# Patient Record
Sex: Female | Born: 1935 | Race: White | Hispanic: No | Smoking: Never smoker
Health system: Southern US, Community
[De-identification: ages and names within clinical notes are randomized; demographics above are authoritative.]

## PROBLEM LIST (undated history)

## (undated) DIAGNOSIS — F22 Delusional disorders: Secondary | ICD-10-CM

## (undated) DIAGNOSIS — G709 Myoneural disorder, unspecified: Secondary | ICD-10-CM

## (undated) DIAGNOSIS — K219 Gastro-esophageal reflux disease without esophagitis: Secondary | ICD-10-CM

## (undated) DIAGNOSIS — E669 Obesity, unspecified: Secondary | ICD-10-CM

## (undated) DIAGNOSIS — N200 Calculus of kidney: Secondary | ICD-10-CM

## (undated) DIAGNOSIS — M1990 Unspecified osteoarthritis, unspecified site: Secondary | ICD-10-CM

## (undated) DIAGNOSIS — R918 Other nonspecific abnormal finding of lung field: Secondary | ICD-10-CM

## (undated) DIAGNOSIS — I2699 Other pulmonary embolism without acute cor pulmonale: Secondary | ICD-10-CM

## (undated) DIAGNOSIS — R011 Cardiac murmur, unspecified: Secondary | ICD-10-CM

## (undated) DIAGNOSIS — G4733 Obstructive sleep apnea (adult) (pediatric): Secondary | ICD-10-CM

## (undated) DIAGNOSIS — Z8719 Personal history of other diseases of the digestive system: Secondary | ICD-10-CM

## (undated) DIAGNOSIS — I10 Essential (primary) hypertension: Secondary | ICD-10-CM

## (undated) DIAGNOSIS — R300 Dysuria: Secondary | ICD-10-CM

## (undated) DIAGNOSIS — R413 Other amnesia: Secondary | ICD-10-CM

## (undated) DIAGNOSIS — R079 Chest pain, unspecified: Secondary | ICD-10-CM

## (undated) DIAGNOSIS — H8390 Unspecified disease of inner ear, unspecified ear: Secondary | ICD-10-CM

## (undated) DIAGNOSIS — I4891 Unspecified atrial fibrillation: Secondary | ICD-10-CM

## (undated) DIAGNOSIS — F23 Brief psychotic disorder: Secondary | ICD-10-CM

## (undated) DIAGNOSIS — Z960 Presence of urogenital implants: Secondary | ICD-10-CM

## (undated) DIAGNOSIS — J3489 Other specified disorders of nose and nasal sinuses: Secondary | ICD-10-CM

## (undated) DIAGNOSIS — N3941 Urge incontinence: Secondary | ICD-10-CM

## (undated) DIAGNOSIS — Z8619 Personal history of other infectious and parasitic diseases: Secondary | ICD-10-CM

## (undated) HISTORY — PX: APPENDECTOMY: SHX54

## (undated) HISTORY — PX: JOINT REPLACEMENT: SHX530

## (undated) HISTORY — PX: CARDIOVASCULAR STRESS TEST: SHX262

## (undated) HISTORY — PX: TOTAL KNEE ARTHROPLASTY: SHX125

## (undated) HISTORY — PX: BLEPHAROPLASTY: SUR158

## (undated) HISTORY — PX: CATARACT EXTRACTION W/ INTRAOCULAR LENS  IMPLANT, BILATERAL: SHX1307

## (undated) HISTORY — PX: TONSILLECTOMY: SUR1361

---

## 1959-02-10 HISTORY — PX: OTHER SURGICAL HISTORY: SHX169

## 1968-06-11 HISTORY — PX: VAGINAL HYSTERECTOMY: SUR661

## 1999-06-06 ENCOUNTER — Encounter: Payer: Self-pay | Admitting: Family Medicine

## 1999-06-06 ENCOUNTER — Encounter: Admission: RE | Admit: 1999-06-06 | Discharge: 1999-06-06 | Payer: Self-pay | Admitting: Family Medicine

## 1999-07-20 ENCOUNTER — Other Ambulatory Visit: Admission: RE | Admit: 1999-07-20 | Discharge: 1999-07-20 | Payer: Self-pay | Admitting: Gastroenterology

## 2000-09-18 ENCOUNTER — Encounter: Admission: RE | Admit: 2000-09-18 | Discharge: 2000-09-18 | Payer: Self-pay | Admitting: Family Medicine

## 2000-09-18 ENCOUNTER — Encounter: Payer: Self-pay | Admitting: Family Medicine

## 2001-10-14 ENCOUNTER — Encounter: Payer: Self-pay | Admitting: Family Medicine

## 2001-10-14 ENCOUNTER — Encounter: Admission: RE | Admit: 2001-10-14 | Discharge: 2001-10-14 | Payer: Self-pay | Admitting: *Deleted

## 2002-10-16 ENCOUNTER — Encounter: Payer: Self-pay | Admitting: Family Medicine

## 2002-10-16 ENCOUNTER — Encounter: Admission: RE | Admit: 2002-10-16 | Discharge: 2002-10-16 | Payer: Self-pay | Admitting: Family Medicine

## 2004-08-28 ENCOUNTER — Encounter: Admission: RE | Admit: 2004-08-28 | Discharge: 2004-08-28 | Payer: Self-pay | Admitting: Family Medicine

## 2005-01-01 ENCOUNTER — Ambulatory Visit: Payer: Self-pay | Admitting: Internal Medicine

## 2005-01-12 ENCOUNTER — Ambulatory Visit: Payer: Self-pay | Admitting: Internal Medicine

## 2005-01-12 ENCOUNTER — Encounter (INDEPENDENT_AMBULATORY_CARE_PROVIDER_SITE_OTHER): Payer: Self-pay | Admitting: *Deleted

## 2005-01-12 ENCOUNTER — Encounter (INDEPENDENT_AMBULATORY_CARE_PROVIDER_SITE_OTHER): Payer: Self-pay | Admitting: Specialist

## 2006-03-19 ENCOUNTER — Ambulatory Visit (HOSPITAL_COMMUNITY): Admission: RE | Admit: 2006-03-19 | Discharge: 2006-03-19 | Payer: Self-pay | Admitting: Family Medicine

## 2006-03-19 ENCOUNTER — Encounter (INDEPENDENT_AMBULATORY_CARE_PROVIDER_SITE_OTHER): Payer: Self-pay | Admitting: *Deleted

## 2008-07-15 ENCOUNTER — Encounter (INDEPENDENT_AMBULATORY_CARE_PROVIDER_SITE_OTHER): Payer: Self-pay | Admitting: *Deleted

## 2008-07-15 ENCOUNTER — Inpatient Hospital Stay (HOSPITAL_COMMUNITY): Admission: EM | Admit: 2008-07-15 | Discharge: 2008-07-17 | Payer: Self-pay | Admitting: Emergency Medicine

## 2008-07-17 ENCOUNTER — Encounter (INDEPENDENT_AMBULATORY_CARE_PROVIDER_SITE_OTHER): Payer: Self-pay | Admitting: *Deleted

## 2008-07-29 ENCOUNTER — Telehealth: Payer: Self-pay | Admitting: Internal Medicine

## 2008-08-18 HISTORY — PX: TRANSTHORACIC ECHOCARDIOGRAM: SHX275

## 2008-10-01 ENCOUNTER — Encounter (INDEPENDENT_AMBULATORY_CARE_PROVIDER_SITE_OTHER): Payer: Self-pay | Admitting: *Deleted

## 2008-10-01 ENCOUNTER — Encounter: Admission: RE | Admit: 2008-10-01 | Discharge: 2008-10-01 | Payer: Self-pay | Admitting: Family Medicine

## 2009-03-09 ENCOUNTER — Ambulatory Visit (HOSPITAL_COMMUNITY): Admission: RE | Admit: 2009-03-09 | Discharge: 2009-03-09 | Payer: Self-pay | Admitting: Family Medicine

## 2009-03-09 ENCOUNTER — Encounter (INDEPENDENT_AMBULATORY_CARE_PROVIDER_SITE_OTHER): Payer: Self-pay | Admitting: *Deleted

## 2009-04-25 ENCOUNTER — Inpatient Hospital Stay (HOSPITAL_COMMUNITY): Admission: RE | Admit: 2009-04-25 | Discharge: 2009-04-28 | Payer: Self-pay | Admitting: Orthopedic Surgery

## 2009-04-25 HISTORY — PX: TOTAL KNEE ARTHROPLASTY: SHX125

## 2009-04-28 ENCOUNTER — Encounter (INDEPENDENT_AMBULATORY_CARE_PROVIDER_SITE_OTHER): Payer: Self-pay | Admitting: *Deleted

## 2009-10-17 ENCOUNTER — Inpatient Hospital Stay (HOSPITAL_COMMUNITY): Admission: RE | Admit: 2009-10-17 | Discharge: 2009-10-20 | Payer: Self-pay | Admitting: Orthopedic Surgery

## 2009-10-20 ENCOUNTER — Encounter (INDEPENDENT_AMBULATORY_CARE_PROVIDER_SITE_OTHER): Payer: Self-pay | Admitting: *Deleted

## 2009-10-25 ENCOUNTER — Observation Stay (HOSPITAL_COMMUNITY): Admission: EM | Admit: 2009-10-25 | Discharge: 2009-10-26 | Payer: Self-pay | Admitting: Emergency Medicine

## 2009-10-26 ENCOUNTER — Ambulatory Visit: Payer: Self-pay | Admitting: Vascular Surgery

## 2009-10-26 ENCOUNTER — Encounter (INDEPENDENT_AMBULATORY_CARE_PROVIDER_SITE_OTHER): Payer: Self-pay | Admitting: *Deleted

## 2010-03-10 ENCOUNTER — Encounter (INDEPENDENT_AMBULATORY_CARE_PROVIDER_SITE_OTHER): Payer: Self-pay | Admitting: *Deleted

## 2010-03-10 ENCOUNTER — Ambulatory Visit (HOSPITAL_COMMUNITY): Admission: RE | Admit: 2010-03-10 | Discharge: 2010-03-10 | Payer: Self-pay | Admitting: Family Medicine

## 2010-06-14 ENCOUNTER — Encounter: Payer: Self-pay | Admitting: Internal Medicine

## 2010-06-14 ENCOUNTER — Ambulatory Visit
Admission: RE | Admit: 2010-06-14 | Discharge: 2010-06-14 | Payer: Self-pay | Source: Home / Self Care | Attending: Internal Medicine | Admitting: Internal Medicine

## 2010-06-14 DIAGNOSIS — K219 Gastro-esophageal reflux disease without esophagitis: Secondary | ICD-10-CM | POA: Insufficient documentation

## 2010-06-14 DIAGNOSIS — K625 Hemorrhage of anus and rectum: Secondary | ICD-10-CM | POA: Insufficient documentation

## 2010-06-14 DIAGNOSIS — K5730 Diverticulosis of large intestine without perforation or abscess without bleeding: Secondary | ICD-10-CM | POA: Insufficient documentation

## 2010-06-14 DIAGNOSIS — G4733 Obstructive sleep apnea (adult) (pediatric): Secondary | ICD-10-CM | POA: Insufficient documentation

## 2010-06-14 DIAGNOSIS — Z860100 Personal history of colon polyps, unspecified: Secondary | ICD-10-CM | POA: Insufficient documentation

## 2010-07-02 ENCOUNTER — Encounter: Payer: Self-pay | Admitting: Family Medicine

## 2010-07-03 ENCOUNTER — Encounter: Payer: Self-pay | Admitting: Family Medicine

## 2010-07-11 ENCOUNTER — Ambulatory Visit
Admission: RE | Admit: 2010-07-11 | Discharge: 2010-07-11 | Payer: Self-pay | Source: Home / Self Care | Attending: Internal Medicine | Admitting: Internal Medicine

## 2010-07-11 ENCOUNTER — Other Ambulatory Visit: Payer: Self-pay | Admitting: Internal Medicine

## 2010-07-11 DIAGNOSIS — D126 Benign neoplasm of colon, unspecified: Secondary | ICD-10-CM

## 2010-07-13 NOTE — Discharge Summary (Signed)
Summary: Discharge Summary    NAME:  Monica Strong, Monica Strong NO.:  [id number]      MEDICAL RECORD NO.:  [id number]          PATIENT TYPE:  INP      LOCATION:  1605                         FACILITY:  [facility]      PHYSICIAN:  Ollen Gross, M.D.    DATE OF BIRTH:  1931-06-24      DATE OF ADMISSION:  10/25/2009   DATE OF DISCHARGE:  10/26/2009                                  DISCHARGE SUMMARY      ADMITTING DIAGNOSES:   1. Recent status post left total knee with postoperative swelling and       leg pain.   2. History of gastritis.   3. Cataracts.   4. Sleep apnea.   5. Hiatal hernia.   6. Hemorrhoids.   7. History of diverticulosis.   8. Osteoarthritis.   9. Short-term memory loss.   10.History of shingles.   11.Occasional incontinence.   12.Hepatitis during teenage years, probably viral.      DISCHARGE DIAGNOSES:   1. Recent status post left total knee with postoperative leg pain and       swelling.  Deep vein thrombosis ruled out, negative scan.      1. History of gastritis.   2. Cataracts.3. Sleep apnea.   4. Hiatal hernia.   5. Hemorrhoids.   6. History of diverticulosis.   7. Osteoarthritis.   8. Short-term memory loss.   9. History of shingles.   10.Occasional incontinence.   11.Hepatitis during teenage years, probably viral.      PROCEDURES:  None.      HISTORY:  Monica Strong is a 77 year old female, recent total knee.  She   went over to [facility] and had some postoperative swelling and leg   pain.  She was sent to the emergency department on the evening of May 17   for evaluation and possible ruled out deep vein thrombosis.  The   hospital was unable to do duplex Doppler scans during the night so she   was admitted overnight.  She had the scan in the next morning.      LABORATORY DATA:  CBC showed admission hemoglobin of 9.9, hematocrit   29.0, white cell count 6.1, platelets 310.  PT/INR 18.7 and 1.58.   Sodium 140, potassium 4.1, chloride  105, BUN 22, creatinine 0.9.   Doppler study was reported as negative.  I do not have the typed report   at the time of this dictation, but it was called up and reported as   negative.      HOSPITAL COURSE:  The patient was admitted through the emergency   department  at [facility] and placed at bedrest overnight.   She was seen on rounds the next morning by Dr. Lequita Halt and found to have   some swelling in the leg, but it did not appear to be too much out of   the ordinary or unexpected swelling.  She did have a Doppler scan which   was reported as negative.  Due to the  negative workup, it was felt that   she would be able to go back to [facility] today.      DISCHARGE/PLAN:   1. The patient was transferred back to [facility] today, Oct 26, 2009.   2. Discharge diagnoses same as above.   3. Discharge medications.  She is to continue all her current       medications which include:   4. Ocuvite 1 tablet daily.   5. Colace 100 mg twice a day.   6. Prilosec 20 mg daily.   7. Nu-Iron daily.   8. Robaxin 500 mg q.6-8 hours p.r.n. spasm.   9. Percocet 5 mg 1 or 2 every 4-6 hours as needed for pain.   10.She will also have Coumadin protocol.  Please titrate the Coumadin       level for a target INR of between 2.0 and 3.0.  She is to be on       Coumadin for total of 3 weeks from the date of surgery of Oct 17, 2009.      DIET:  As tolerated.      ACTIVITY:  She is to resume her total knee protocol.  Physical therapy   and occupational therapy for continued gait training and ambulation,   activities of daily living, range of motion and strengthening exercises.      FOLLOWUP:  Please make sure she has her followup appointment on Tuesday,   May 24.  Please contact the office of [phone].      DISPOSITION:  [facility]      CONDITION UPON DISCHARGE:  Stable.               Alexzandrew L. Julien Girt, P.A.C.         ______________________________   Ollen Gross, M.D.         ALP/MEDQ  D:  10/26/2009  T:  10/26/2009  Job:  [id number]      cc:   Armanda Magic, M.D.   Fax: [phone]         [facility]      Electronically Signed by Patrica Duel P.A.C. on 10/31/2009 10:53:12 AM   Electronically Signed by Ollen Gross M.D. on 10/31/2009 04:39:38 PM

## 2010-07-13 NOTE — Procedures (Signed)
Summary: Colonoscopy/[facility]  Colonoscopy/[facility]   Imported By: Sherian Rein 06/16/2010 07:09:37  _____________________________________________________________________  External Attachment:    Type:   Image     Comment:   External Document

## 2010-07-13 NOTE — Assessment & Plan Note (Signed)
Summary: Rectal bleeding and a history of colon polyps   History of Present Illness Visit Type: Initial Visit Primary GI MD: Yancey Flemings MD Primary Provider: Elias Else, MD Requesting Provider: Dr. Nicholos Johns Chief Complaint: bleeding and a history of polyps History of Present Illness:   77 year old female with multiple medical problems including morbid obesity, sleep apnea, severe osteoarthritis, and adenomatous colon polyps. She presents today regarding recent problems with transient abdominal discomfort, rectal discomfort, and rectal bleeding. She reports a several month history of intermittent problems with lower abdominal cramping improved with defecation. This has resolved. Before Christmas she noticed significant amounts of blood mixed with her stool. This scared her. There was some associated rectal discomfort. She saw a physician assistant at her primary provider's office. Rectal exam at that time unremarkable. She does have a history of multiple adenomatous colon polyps on her index colonoscopy in August of 2006. Also at that time diverticulosis and internal hemorrhoids. She is overdue for followup..she does have a history of reflux disease for which she takes Prilosec. Significant symptoms off medication. Good control on medication. Her only other complaint is bloating. She has had no weight loss.   GI Review of Systems    Reports abdominal pain, acid reflux, bloating, and  heartburn.     Location of  Abdominal pain: left sided/right sided.    Denies belching, dysphagia with liquids, dysphagia with solids, loss of appetite, nausea, vomiting, vomiting blood, weight loss, and  weight gain.      Reports diverticulosis, hemorrhoids, rectal bleeding, and  rectal pain.     Denies anal fissure, black tarry stools, change in bowel habit, constipation, diarrhea, fecal incontinence, heme positive stool, irritable bowel syndrome, jaundice, light color stool, and  liver problems. Preventive  Screening-Counseling & Management  Alcohol-Tobacco     Smoking Status: never  Caffeine-Diet-Exercise     Does Patient Exercise: no      Drug Use:  no.      Current Medications (verified): 1)  Prilosec Otc 20 Mg Tbec (Omeprazole Magnesium) .... Take 1 Tablet By Mouth Once A Day As Needed 2)  Tylenol Extra Strength 500 Mg Tabs (Acetaminophen) .... Take 2 Tablets By Mouth As Needed For Pain 3)  Fish Oil (Unknown Dosage) .... Take 1 Capsule By Mouth Once Daily 4)  Multivitamins  Tabs (Multiple Vitamin) .... Take 1 Tablet By Mouth Once A Day 5)  Soothe Hydration 1.25 % Soln (Artificial Tear Solution) .... Apply 1 Drop Each Eye Once Daily  Allergies (verified): 1)  ! Demerol 2)  ! Pcn 3)  ! Sulfa  Past History:  Past Medical History: Hemorrhoids Hiatal Hernia Hx Gastritis Cataracts Sleep Apnea Diverticulosis Osteoarthritis.  Short-term memory loss.  History of shingles.  Occasional incontinence.  Hepatitis during teenage years, probably viral.  Hx Colon Polyps  Past Surgical History: Bilateral Knee Surgery Left Breast Lumpectomy Cataract Extraction-bilateral Lens implants-bilateral Eyelid sugery (x1 right, x2 left)  Family History: Family History of Breast Cancer:Daughter Family History of Heart Disease: Father  Social History: Patient has never smoked.  Alcohol Use - yes-very rare Illicit Drug Use - no Daily Caffeine Use-1 cup daily Patient does not get regular exercise.  Smoking Status:  never Drug Use:  no Does Patient Exercise:  no  Review of Systems       The patient complains of allergy/sinus, arthritis/joint pain, back pain, change in vision, confusion, depression-new, fatigue, fever, heart murmur, sleeping problems, swelling of feet/legs, and urination changes/pain.  The patient denies anemia, anxiety-new,  blood in urine, breast changes/lumps, cough, coughing up blood, fainting, headaches-new, hearing problems, heart rhythm changes, itching, menstrual  pain, muscle pains/cramps, night sweats, nosebleeds, pregnancy symptoms, shortness of breath, skin rash, sore throat, swollen lymph glands, thirst - excessive , urination - excessive , urine leakage, vision changes, and voice change.    Vital Signs:  Patient profile:   77 year old female Height:      62 inches Weight:      226.50 pounds BMI:     41.58 BSA:     2.02 Pulse rate:   64 / minute Pulse rhythm:   regular BP sitting:   122 / 70  (left arm)  Vitals Entered By: Lamona Curl CMA Duncan Dull) (June 14, 2010 10:29 AM)  Physical Exam  General:  Well developed,obese, well nourished, no acute distress. Head:  Normocephalic and atraumatic. Eyes:  PERRLA, no icterus. Mouth:  No deformity or lesions, Neck:  Supple; no masses or thyromegaly. Lungs:  Clear throughout to auscultation. Heart:  Regular rate and rhythm; no murmurs, rubs,  or bruits. Abdomen:  Soft, obese,nontender and nondistended. No masses, hepatosplenomegaly or hernias noted. Normal bowel sounds. Rectal:  deferred until colonoscopy Msk:  changes of osteoarthritis in the knees bilaterally Pulses:  Normal pulses noted. Extremities:  No clubbing, cyanosis, edema or deformities noted. Neurologic:  Alert and  oriented x4. Skin:  Intact without significant lesions or rashes. Psych:  Alert and cooperative. Normal mood and affect.   Impression & Recommendations:  Problem # 1:  RECTAL BLEEDING (ICD-569.3) Recent problems with rectal bleeding and minor rectal discomfort as described. May have had a transient fissure. Could be due to known hemorrhoids. Need to rule out neoplasia.  Plan: #1. Colonoscopy. The nature of the procedure as well as the risks, benefits, and alternatives have been reviewed. She understood and agreed to proceed. The patient is high risk due to her obesity and sleep apnea. We will need to perform the procedure with propofol and CRNA supervision #2. Movi prep prescribed. The patient instructed on  its use.  Problem # 2:  COLONIC POLYPS, HX OF (ICD-V12.72) history of multiple adenomatous colon polyps August 2006. Overdue for followup. Colonoscopy plans as outlined above  Problem # 3:  MORBID OBESITY (ICD-278.01) Assessment: Unchanged  Orders: Colonoscopy (Colon)  Problem # 4:  SLEEP APNEA, OBSTRUCTIVE (ICD-327.23) Assessment: Unchanged  Orders: Colonoscopy (Colon)  Problem # 5:  GERD (ICD-530.81) continue PPIs for symptom control and reflux precautions with attention to weight loss  Patient Instructions: 1)  Colonoscopy [facility] with Propoful 07/11/10 9:00 am arrive at 8:00 am 2)  Movi prep instructions given 3)  Movi prep Rx. sent to pharmacy. 4)  Colonoscopy and Flexible Sigmoidoscopy brochure given.  5)  Copy sent to : Elias Else, MD 6)  The medication list was reviewed and reconciled.  All changed / newly prescribed medications were explained.  A complete medication list was provided to the patient / caregiver. Prescriptions: MOVIPREP 100 GM  SOLR (PEG-KCL-NACL-NASULF-NA ASC-C) As per prep instructions.  #1 x 0   Entered by:   Milford Cage NCMA   Authorized by:   Hilarie Fredrickson MD   Signed by:   Milford Cage NCMA on 06/14/2010   Method used:   Electronically to        [facility]  [phone]* (retail)       [address] [place], [place]  [address]       Ph:  [phone] or [phone]       Fax: [phone]   RxID:   [id number]

## 2010-07-13 NOTE — Discharge Summary (Signed)
Summary: Discharge Summary    NAME:  Monica Strong, Monica Strong                ACCOUNT NO.:  [id number]      MEDICAL RECORD NO.:  [id number]          PATIENT TYPE:  INP      LOCATION:  1617                         FACILITY:  [facility]      PHYSICIAN:  Ollen Gross, M.D.    DATE OF BIRTH:  January 14, 1932      DATE OF ADMISSION:  10/17/2009   DATE OF DISCHARGE:  10/20/2009                                  DISCHARGE SUMMARY      ADMITTING DIAGNOSES:   1. Osteoarthritis of the left knee.   2. History of gastritis.   3. Cataracts.   4. Sleep apnea.   5. Hiatal hernia.   6. Hemorrhoids.   7. Past history of diverticulosis.   8. Osteoarthritis.   9. Short-term memory loss.   10.History of shingles.   11.Occasional incontinence.   12.Hepatitis during teenage years (probably viral).      DISCHARGE DIAGNOSES:   1. Osteoarthritis of the left knee status post left total knee       replacement arthroplasty.   2. Mild postoperative blood loss anemia, did not require transfusion.   3. Mild thrombocytopenia postoperative.      PROCEDURE:  Oct 17, 2009 left total knee.      SURGEON:  Dr. Lequita Halt.      ASSISTANT:  Avel Peace, PA-C.      ANESTHESIA:  Spinal.      TOURNIQUET TIME:  35 minutes.      CONSULTS:  None.      BRIEF HISTORY:  Monica Strong is a 77 year old female with end-stage   arthritis of the left knee, progressive, worsening pain and dysfunction.   She has had a successful right total knee, now presents for a left total   knee.      LABORATORY DATA:  Preoperative CBC showed a hemoglobin of 13, hematocrit   39.2, white cell count 4.8, platelets 183.  PT/INR 12.8 and 0.9 with a   PTT of 29.  Chem panel on admission within normal limits.  Preoperative   UA did show some type brace leukocytes, a few epithelials, zero to 2   white cells, few bacteria.  Blood group type O negative.  Serial CBCs   were followed.  Hemoglobin dropped down to 9.5 then 9.3, last known   hemoglobin and hematocrit of  9.1 and 27.7.  Platelets started at 183,   down to 117, last noted at 115.  Serial ProTimes followed per Coumadin   protocol, last known PT/INR 18.4 and 1.54.  Serial BMETs were followed,   electrolytes remained within normal limits.      EKG Oct 12, 2009:  Normal sinus rhythm with sinus arrhythmia.  No   significant change was found, confirmed by Dr. Charlton Haws.      HOSPITAL COURSE:  The patient was admitted [facility], taken   to the OR, underwent above-stated procedure without complication.  The   patient tolerated the procedure well, later transferred to the recovery   room orthopedic floor.  Started on  p.o. and IV analgesic pain control   following surgery.  She knew she wanted to look into [facility] or a   skilled facility so we got social work involved, we got FL-2 signed and   sent out.  She had a little bit of low output requiring a little bit of   extra fluids on day one, hemoglobin was about 9.5.  Started back on her   home medications.  Started getting up out of bed on day one, just   transfers.  She did ambulate a little bit of short distance in the room,   about 20 feet.  By day two she was doing a little bit better, had some   belly discomfort and no bowel movement so we tried a little suppository   to get her bowels moving.  She was passing a little gas which did   improve.  Hemoglobin was 9.3.  Dressing changed, incision looked good,   no signs of infection.  Continue to improve and by day three she was   walking over 90 feet.  It was felt a bed was available over at [facility].  Seen in rounds, incision looked good and we arranged transport   over at that time.      DISCHARGE PLAN:  The patient transferred over to [facility] on Oct 20, 2009.      DISCHARGE DIAGNOSES:  Please see above.      DISCHARGE MEDICATIONS:  Current medications at time of transfer include:   1. Ocuvite 1 tablet daily.   2. Colace 100 mg p.o. b.i.d.   3. Coumadin  protocol, please titrate the Coumadin level for a target       INR between 2 and 3.  She needs to be on Coumadin for three  weeks       from the date of surgery of Oct 17, 2009.   4. Prilosec 20 mg p.o. daily.   5. Nu-Iron 150 mg p.o. daily.   6. Robaxin 500 mg p.o. q.6 - 8 hours p.r.n. spasm.   7. Percocet 5 mg one or two every 4 - 6 hours as needed for pain.   8. Tylenol 325 one or two every 4 - 6 hours as needed for mild pain,       temperature or headache.      DIET:  As tolerated.      ACTIVITIES:  She is total knee protocol, weightbearing as tolerated left   lower extremity.  PT and OT for gait training, ambulation, ADLs, range   of motion and strengthening exercises.  She may start showering however   do not submerge the incision under water.  Daily dressing change.      FOLLOWUP:  She needs to follow up with Dr. Lequita Halt in the office on   Tuesday, May 24; please contact the office at [phone] to help arrange   appointment and followup and transfer the patient for further care.      DISPOSITION:  [facility].  Please note she does use a CPAP at night   and should continue the CPAP in the evenings.      CONDITION UPON DISCHARGE:  Improving.               Alexzandrew L. Julien Girt, P.A.C.         ______________________________   Ollen Gross, M.D.         ALP/MEDQ  D:  10/20/2009  T:  10/20/2009  Job:  [id number]      cc:   Elana Alm. Nicholos Johns, M.D.   Fax: [phone]      Armanda Magic, M.D.   Fax: [phone]      Electronically Signed by Patrica Duel P.A.C. on 10/21/2009 10:11:01 AM   Electronically Signed by Ollen Gross M.D. on 10/31/2009 04:39:23 PM

## 2010-07-13 NOTE — Discharge Summary (Signed)
Summary: Discharge Summary  NAME:  Monica Strong, CAMARENA                ACCOUNT NO.:  [id number]      MEDICAL RECORD NO.:  [id number]          PATIENT TYPE:  INP      LOCATION:  1607                         FACILITY:  [facility]      PHYSICIAN:  Ollen Gross, M.D.    DATE OF BIRTH:  1931/08/28      DATE OF ADMISSION:  04/25/2009   DATE OF DISCHARGE:  04/28/2009                                  DISCHARGE SUMMARY      ADMITTING DIAGNOSES:   1. Bilateral knees osteoarthritis, right greater than left.   2. Cataracts.   3. Sleep apnea.   4. Hiatal hernia.   5. Hemorrhoids.   6. Diverticulosis.      DISCHARGE DIAGNOSES:   1. Osteoarthritis right knee status post right total knee replacement       arthroplasty.   2. Osteoarthritis left knee.   3. Postoperative acute blood loss anemia.   4. Cataracts.   5. Sleep apnea.   6. Hiatal hernia.   7. Hemorrhoids.   8. Diverticulosis.   9. Postoperative confusion/altered mental status improved.      PROCEDURE:  April 25, 2009, right total knee.      SURGEON:  Ollen Gross, M.D.      ASSISTANT:  Alexzandrew L. Perkins, P.A.C.      ANESTHESIA:  Spinal.      CONSULTS:  None.      BRIEF HISTORY:  Ms. Nuccio is a 77 year old female with end-stage   arthritis of both knees progressively getting worse.  Felt to be a good   candidate and subsequently admitted to the hospital for replacement   surgery.      LABORATORY DATA:  Preop CBC showed hemoglobin 13.4, hematocrit 39.6,   white cell count 6.0, platelets 191, PT/INR 12.9 and 0.98, PTT of 31.   Chem panel on admission all within normal limits.  Preop UA negative.   Blood group type O negative.  Serial CBCs were followed daily.   Hemoglobin dropped from 13.4 to 10.4 then 9.3.  Last H and H 8.9 and   26.8.  Serial pro-times followed per Coumadin protocol daily.  Last pro-   time INR at time of discharge is 15.1 and 1.2, slowly increased, so we   will start Lovenox and keep her on Lovenox till  she is therapeutic.   Serial BMETs were followed for 48 hours.  Electrolytes remained within   normal limits.  Myocardial stress test taken on July 17, 2008, normal   study, demonstrating no evidence of inducible ischemia with   administration of adenosine or fixed perfusion defects.  Left   ventricular function is normal.      DIAGNOSTICS:  EKG July 15, 2008:  Sinus rhythm, normal P axis, V rate   50-99, diffuse T-wave inversion with ST depression in V3.  No old   tracing to compare.  Confirmed by Dr. Susy Frizzle.      HOSPITAL COURSE:  The patient was admitted to [facility],   taken to the OR and underwent  the above stated procedure without   complication.  The patient tolerated the procedure well, later   transferred to the recovery room on the orthopedic floor.  Started on   PCA and p.o. analgesic pain control following surgery, 24 hours postop   antibiotics given IV.  She was initially placed on IV narcotics and then   switched over p.o. medications.  Doing fairly well on the morning of day   1.  Encouraged p.o. meds and mobility.  She was very slow to progress   with therapy.  Low urine output was noted, so we gave her extra fluids   and followed the urine output.  She just got out of bed on day 1.  By   day 2, output was still a little low, but slowly increasing.  We did   assist that with a little bit of diuresing with some Lasix IV.  She had   been weaned over p.o. meds.  Unfortunately on the evening of day 1 and   the morning of day 2, she developed some mild postop confusion, altered   mental status, so we discontinued the narcotics and switched her over to   Ultracet.  Hemoglobin was down a little bit lower at 9.3, but she is   asymptomatic with the hemoglobin.  She had a little bit of confusion   with the narcotics which did interfere with her therapy.  By that   afternoon, she was doing much better.  By the morning of day 3, the   confusion and altered  mental status resolved.  She was tolerating the   p.o. meds.  We did look for a skilled facility due to the fact she was   slowly progressing.  A bed did become available at [facility].  She   was seen on rounds on day 3 by Dr. Lequita Halt doing well, no complaints and   she was transferred over to [facility] at that time.      DISPOSITION:  The patient will be transferred over to [facility] on   April 28, 2009.      DISCHARGE TRANSFER MEDICATIONS:   1. Coumadin protocol.  Please titrate the Coumadin level for target       INR between 2.0 and 3.0.  She needs to be on Coumadin for a total       of 3 weeks from the date of surgery, April 25, 2009.   2. Lovenox 40 mg subcutaneous injection daily.  Continue daily until       the patient's INR on her Coumadin is 2.0 or greater.  Once her INR       reaches 2.0 or greater, the Lovenox can be discontinued.  Please       note, she will need daily pro-time INRs until she is therapeutic       and then INRs as per protocol.   3. Colace 100 mg p.o. b.i.d.   4. Ocuvite tab p.o. daily.   5. Prilosec 20 mg p.o. q.a.m.   6. Robaxin 500 mg p.o. q.6-8 h. p.r.n. spasm.   7. Tylenol 325 one-two every 4-6 hours as needed for mild pain of       temporal headache.   8. Laxative of choice.   9. Enema of choice.   10.Ultracet 1-2 tablets every 6 hours as needed for pain.      DIET:  As tolerated.      ACTIVITY:  She is weightbearing as tolerated to  the right lower   extremity total knee protocol.  PT and OT for gait training ambulation,   ADLs, range of motion and strengthening exercises.  She may start   showering, however, do not submerge the incision under water.  Daily   dressing change.      FOLLOW UP:  She needs to follow up with Dr. Lequita Halt in the office about   2 weeks from the date of surgery.  Please contact the office at [phone]   to help arrange appointment time and follow up care for this patient.      CONDITION ON DISCHARGE:   Slowly improving.      Please note, the patient will need daily pro-time INR draws to ensure   that she becomes therapeutic on her Coumadin.  She will receive Lovenox   40 mg daily until she is therapeutic on her INR.  Once her INR is 2.0 or   greater, the Lovenox can be discontinued and then draw pro-time INRs as   needed just to maintain her protocol for a total of 3 weeks from the   date of surgery.               Alexzandrew L. Perkins, P.A.C.               Ollen Gross, M.D.   Electronically Signed         ALP/MEDQ  D:  04/28/2009  T:  04/28/2009  Job:  [id number]      cc:   Ollen Gross, M.D.   Fax: [phone]      Elana Alm. Nicholos Johns, M.D.   Fax: [phone]      Armanda Magic, M.D.   Fax: [phone]

## 2010-07-13 NOTE — Letter (Signed)
Summary: [facility] Instructions  [facility]  [address] [place], [place] [address]   Phone: [phone]  Fax: [phone]       Monica Strong    27-Jun-1931    MRN: [id number]        Procedure Day /Date:07/11/10  TUESDAY     Arrival Time:8:00 AM     Procedure Time:9:00 AM     Location of Procedure:                    X  [facility] (4th Floor)   PREPARATION FOR COLONOSCOPY WITH MOVIPREP WITH PROPOFUL   Starting 5 days prior to your procedure 07/06/10 do not eat nuts, seeds, popcorn, corn, beans, peas,  salads, or any raw vegetables.  Do not take any fiber supplements (e.g. Metamucil, Citrucel, and Benefiber).  THE DAY BEFORE YOUR PROCEDURE         DATE: 07/10/10  DAY: MONDAY  1.  Drink clear liquids the entire day-NO SOLID FOOD  2.  Do not drink anything colored red or purple.  Avoid juices with pulp.  No orange juice.  3.  Drink at least 64 oz. (8 glasses) of fluid/clear liquids during the day to prevent dehydration and help the prep work efficiently.  CLEAR LIQUIDS INCLUDE: Water Jello Ice Popsicles Tea (sugar ok, no milk/cream) Powdered fruit flavored drinks Coffee (sugar ok, no milk/cream) Gatorade Juice: apple, white grape, white cranberry  Lemonade Clear bullion, consomm, broth Carbonated beverages (any kind) Strained chicken noodle soup Hard Candy                             4.  In the morning, mix first dose of MoviPrep solution:    Empty 1 Pouch A and 1 Pouch B into the disposable container    Add lukewarm drinking water to the top line of the container. Mix to dissolve    Refrigerate (mixed solution should be used within 24 hrs)  5.  Begin drinking the prep at 5:00 p.m. The MoviPrep container is divided by 4 marks.   Every 15 minutes drink the solution down to the next mark (approximately 8 oz) until the full liter is complete.   6.  Follow completed prep with 16 oz of clear liquid of your choice (Nothing red or purple).   Continue to drink clear liquids until bedtime.  7.  Before going to bed, mix second dose of MoviPrep solution:    Empty 1 Pouch A and 1 Pouch B into the disposable container    Add lukewarm drinking water to the top line of the container. Mix to dissolve    Refrigerate  THE DAY OF YOUR PROCEDURE      DATE: 07/11/10 DAY: TUESDAY  Beginning at 4:00 a.m. (5 hours before procedure):         1. Every 15 minutes, drink the solution down to the next mark (approx 8 oz) until the full liter is complete.  2. Follow completed prep with 16 oz. of clear liquid of your choice.    3. You may drink clear liquids until 7:00 AM (2 HOURS BEFORE PROCEDURE).   MEDICATION INSTRUCTIONS  Unless otherwise instructed, you should take regular prescription medications with a small sip of water   as early as possible the morning of your procedure.         OTHER INSTRUCTIONS  You will need a responsible adult at least 75  years of age to accompany you and drive you home.   This person must remain in the waiting room during your procedure.  Wear loose fitting clothing that is easily removed.  Leave jewelry and other valuables at home.  However, you may wish to bring a book to read or  an iPod/MP3 player to listen to music as you wait for your procedure to start.  Remove all body piercing jewelry and leave at home.  Total time from sign-in until discharge is approximately 2-3 hours.  You should go home directly after your procedure and rest.  You can resume normal activities the  day after your procedure.  The day of your procedure you should not:   Drive   Make legal decisions   Operate machinery   Drink alcohol   Return to work  You will receive specific instructions about eating, activities and medications before you leave.    The above instructions have been reviewed and explained to me by   _______________________    I fully understand and can verbalize these instructions  _____________________________ Date _________

## 2010-07-18 ENCOUNTER — Encounter: Payer: Self-pay | Admitting: Internal Medicine

## 2010-07-19 NOTE — Procedures (Addendum)
Summary: Colonoscopy  Patient: Monica Strong Note: All result statuses are Final unless otherwise noted.  Tests: (1) Colonoscopy (COL)   COL Colonoscopy           DONE      [facility]     [address]     [place], [place]  [address]           COLONOSCOPY PROCEDURE REPORT           PATIENT:  Monica Strong, Monica Strong  MR#:  [id number]     BIRTHDATE:  August 30, 1931, 79 yrs. old  GENDER:  female     ENDOSCOPIST:  Wilhemina Bonito. Eda Keys, MD     REF. BY:  Surveillance Program Recall,     PROCEDURE DATE:  07/11/2010     PROCEDURE:  Colonoscopy with snare polypectomy x 9     EXTENDED SERVICE MODIFIER FOR     MULTIPLE POLYPS (9) AND TIME (30 MIN)     ASA CLASS:  Class III     INDICATIONS:  history of pre-cancerous (adenomatous) colon polyps,     surveillance and high-risk screening, rectal bleeding     MEDICATIONS:   MAC sedation, administered by CRNA, propofol     (Diprivan) 270 mg IV           DESCRIPTION OF PROCEDURE:   After the risks benefits and     alternatives of the procedure were thoroughly explained, informed     consent was obtained.  Digital rectal exam was performed and     revealed no abnormalities.   The LB CF-[id number] endoscope     was introduced through the anus and advanced to the cecum, which     was identified by both the appendix and ileocecal valve, without     limitations.Time to cecum = 3:24 min. The quality of the prep was     excellent, using MoviPrep.  The instrument was then slowly     withdrawn (time = 24:02 min) as the colon was fully examined.     <<PROCEDUREIMAGES>>           FINDINGS:  There were multiple polyps identified and removed. Four     in the ascending colon, all <57mm; Three intransverse < 5mm and two     sessile transverse colon polyps 6mm and 8mm. Polyps were snared     without cautery. Retrieval was successful.  Moderate     diverticulosis was found in the sigmoid colon.   Retroflexed views     in the rectum revealed internal hemorrhoids.     The scope was then     withdrawn from the patient and the procedure completed.           COMPLICATIONS:  None           ENDOSCOPIC IMPRESSION:     1) Polyps, multiple - removed     2) Moderate diverticulosis in the sigmoid colon     3) Internal hemorrhoids           RECOMMENDATIONS:     1) Repeat Colonoscopy in 3 years if medically fit and willing.     ______________________________     Wilhemina Bonito. Eda Keys, MD           CC:  Elias Else, MD;  The Patient           n.     eSIGNED:   Wilhemina Bonito. Eda Keys at 07/11/2010 10:06 AM  Monica Strong, Monica Strong, [id number]  Note: An exclamation mark (!) indicates a result that was not dispersed into the flowsheet. Document Creation Date: 07/11/2010 10:07 AM _______________________________________________________________________  (1) Order result status: Final Collection or observation date-time: 07/11/2010 09:54 Requested date-time:  Receipt date-time:  Reported date-time:  Referring Physician:   Ordering Physician: Fransico Setters [phone]) Specimen Source:  Source: Launa Grill Order Number: [id number] Lab site:   Appended Document: Colonoscopy recall     Procedures Next Due Date:    Colonoscopy: 06/2013

## 2010-07-27 NOTE — Letter (Signed)
Summary: Patient Notice- Polyp Results  [facility]  [address] [place], [place] [address]   Phone: [phone]  Fax: [phone]        July 18, 2010 MRN: [id number]    Monica Strong [address] [place], [place]  [address]    Dear Ms. [facility],  I am pleased to inform you that the colon polyp(s) removed during your recent colonoscopy was (were) found to be benign (no cancer detected) upon pathologic examination.  I recommend you have a repeat colonoscopy examination in 3 years to look for recurrent polyps, as having colon polyps increases your risk for having recurrent polyps or even colon cancer in the future.  Should you develop new or worsening symptoms of abdominal pain, bowel habit changes or bleeding from the rectum or bowels, please schedule an evaluation with either your primary care physician or with me.  Additional information/recommendations:  __ No further action with gastroenterology is needed at this time. Please      follow-up with your primary care physician for your other healthcare      needs.   Please call us if you are having persistent problems or have questions about your condition that have not been fully answered at this time.  Sincerely,  Hilarie Fredrickson MD  This letter has been electronically signed by your physician.  Appended Document: Patient Notice- Polyp Results LETTER MAILED

## 2010-08-28 LAB — CBC
Hemoglobin: 9.5 g/dL — ABNORMAL LOW (ref 12.0–15.0)
MCV: 93.9 fL (ref 78.0–100.0)
RBC: 3.07 MIL/uL — ABNORMAL LOW (ref 3.87–5.11)

## 2010-08-28 LAB — DIFFERENTIAL
Basophils Relative: 1 % (ref 0–1)
Eosinophils Relative: 1 % (ref 0–5)
Lymphocytes Relative: 25 % (ref 12–46)
Monocytes Absolute: 0.5 10*3/uL (ref 0.1–1.0)
Neutro Abs: 3.9 10*3/uL (ref 1.7–7.7)

## 2010-08-28 LAB — POCT I-STAT, CHEM 8
Calcium, Ion: 1.18 mmol/L (ref 1.12–1.32)
Glucose, Bld: 107 mg/dL — ABNORMAL HIGH (ref 70–99)
Potassium: 4.1 mEq/L (ref 3.5–5.1)
Sodium: 140 mEq/L (ref 135–145)

## 2010-08-28 LAB — PROTIME-INR: INR: 1.58 — ABNORMAL HIGH (ref 0.00–1.49)

## 2010-08-29 ENCOUNTER — Other Ambulatory Visit (HOSPITAL_COMMUNITY): Payer: Self-pay | Admitting: Family Medicine

## 2010-08-29 DIAGNOSIS — R918 Other nonspecific abnormal finding of lung field: Secondary | ICD-10-CM

## 2010-08-29 LAB — CBC
HCT: 27.7 % — ABNORMAL LOW (ref 36.0–46.0)
HCT: 28.3 % — ABNORMAL LOW (ref 36.0–46.0)
HCT: 28.8 % — ABNORMAL LOW (ref 36.0–46.0)
HCT: 39.2 % (ref 36.0–46.0)
Hemoglobin: 9.1 g/dL — ABNORMAL LOW (ref 12.0–15.0)
Hemoglobin: 9.3 g/dL — ABNORMAL LOW (ref 12.0–15.0)
Hemoglobin: 9.5 g/dL — ABNORMAL LOW (ref 12.0–15.0)
MCHC: 32.9 g/dL (ref 30.0–36.0)
MCV: 94 fL (ref 78.0–100.0)
MCV: 94.8 fL (ref 78.0–100.0)
RBC: 2.92 MIL/uL — ABNORMAL LOW (ref 3.87–5.11)
RBC: 2.98 MIL/uL — ABNORMAL LOW (ref 3.87–5.11)
RBC: 3.04 MIL/uL — ABNORMAL LOW (ref 3.87–5.11)
RBC: 4.17 MIL/uL (ref 3.87–5.11)
RDW: 14.3 % (ref 11.5–15.5)
WBC: 6.7 10*3/uL (ref 4.0–10.5)

## 2010-08-29 LAB — TYPE AND SCREEN
ABO/RH(D): O NEG
Antibody Screen: NEGATIVE

## 2010-08-29 LAB — URINALYSIS, ROUTINE W REFLEX MICROSCOPIC
Bilirubin Urine: NEGATIVE
Hgb urine dipstick: NEGATIVE
Protein, ur: NEGATIVE mg/dL
Urobilinogen, UA: 0.2 mg/dL (ref 0.0–1.0)

## 2010-08-29 LAB — PROTIME-INR
INR: 1.12 (ref 0.00–1.49)
INR: 1.54 — ABNORMAL HIGH (ref 0.00–1.49)
Prothrombin Time: 17.1 seconds — ABNORMAL HIGH (ref 11.6–15.2)

## 2010-08-29 LAB — COMPREHENSIVE METABOLIC PANEL
ALT: 12 U/L (ref 0–35)
AST: 15 U/L (ref 0–37)
Alkaline Phosphatase: 71 U/L (ref 39–117)
CO2: 29 mEq/L (ref 19–32)
Calcium: 9.5 mg/dL (ref 8.4–10.5)
GFR calc Af Amer: >60 mL/min (ref >60)
GFR calc non Af Amer: >60 mL/min (ref >60)
Glucose, Bld: 98 mg/dL (ref 70–99)
Potassium: 4.4 mEq/L (ref 3.5–5.1)
Sodium: 143 mEq/L (ref 135–145)
Total Protein: 7 g/dL (ref 6.0–8.3)

## 2010-08-29 LAB — BASIC METABOLIC PANEL
Calcium: 8.6 mg/dL (ref 8.4–10.5)
Chloride: 102 mEq/L (ref 96–112)
GFR calc Af Amer: >60 mL/min (ref >60)
GFR calc Af Amer: >60 mL/min (ref >60)
GFR calc non Af Amer: >60 mL/min (ref >60)
Potassium: 3.9 mEq/L (ref 3.5–5.1)
Potassium: 4.2 mEq/L (ref 3.5–5.1)
Sodium: 137 mEq/L (ref 135–145)
Sodium: 140 mEq/L (ref 135–145)

## 2010-08-29 LAB — URINE MICROSCOPIC-ADD ON

## 2010-08-31 ENCOUNTER — Ambulatory Visit (HOSPITAL_COMMUNITY)
Admission: RE | Admit: 2010-08-31 | Discharge: 2010-08-31 | Disposition: A | Discharge status: Home / Self Care | Payer: Medicare Other | Source: Ambulatory Visit | Attending: Family Medicine | Admitting: Family Medicine

## 2010-08-31 DIAGNOSIS — R918 Other nonspecific abnormal finding of lung field: Secondary | ICD-10-CM

## 2010-08-31 DIAGNOSIS — J984 Other disorders of lung: Secondary | ICD-10-CM | POA: Insufficient documentation

## 2010-08-31 DIAGNOSIS — Q619 Cystic kidney disease, unspecified: Secondary | ICD-10-CM | POA: Insufficient documentation

## 2010-08-31 DIAGNOSIS — R599 Enlarged lymph nodes, unspecified: Secondary | ICD-10-CM | POA: Insufficient documentation

## 2010-09-13 LAB — TYPE AND SCREEN
ABO/RH(D): O NEG
Antibody Screen: NEGATIVE

## 2010-09-13 LAB — COMPREHENSIVE METABOLIC PANEL
Albumin: 3.8 g/dL (ref 3.5–5.2)
BUN: 21 mg/dL (ref 6–23)
Chloride: 110 mEq/L (ref 96–112)
Creatinine, Ser: 0.64 mg/dL (ref 0.4–1.2)
GFR calc non Af Amer: >60 mL/min (ref >60)
Glucose, Bld: 100 mg/dL — ABNORMAL HIGH (ref 70–99)
Total Bilirubin: 0.5 mg/dL (ref 0.3–1.2)

## 2010-09-13 LAB — URINALYSIS, ROUTINE W REFLEX MICROSCOPIC
Glucose, UA: NEGATIVE mg/dL
Hgb urine dipstick: NEGATIVE
Ketones, ur: NEGATIVE mg/dL
Protein, ur: NEGATIVE mg/dL

## 2010-09-13 LAB — BASIC METABOLIC PANEL
BUN: 13 mg/dL (ref 6–23)
CO2: 29 mEq/L (ref 19–32)
Chloride: 106 mEq/L (ref 96–112)
Glucose, Bld: 134 mg/dL — ABNORMAL HIGH (ref 70–99)
Potassium: 3.9 mEq/L (ref 3.5–5.1)
Potassium: 4.2 mEq/L (ref 3.5–5.1)
Sodium: 139 mEq/L (ref 135–145)

## 2010-09-13 LAB — PROTIME-INR
INR: 1.11 (ref 0.00–1.49)
INR: 0.98 (ref 0.00–1.49)
Prothrombin Time: 14.2 seconds (ref 11.6–15.2)
Prothrombin Time: 12.9 seconds (ref 11.6–15.2)

## 2010-09-13 LAB — CBC
HCT: 26.8 % — ABNORMAL LOW (ref 36.0–46.0)
HCT: 28.2 % — ABNORMAL LOW (ref 36.0–46.0)
HCT: 39.6 % (ref 36.0–46.0)
Hemoglobin: 10.4 g/dL — ABNORMAL LOW (ref 12.0–15.0)
Hemoglobin: 9.3 g/dL — ABNORMAL LOW (ref 12.0–15.0)
MCHC: 32.8 g/dL (ref 30.0–36.0)
MCHC: 33 g/dL (ref 30.0–36.0)
MCV: 94.8 fL (ref 78.0–100.0)
MCV: 95.3 fL (ref 78.0–100.0)
Platelets: 117 10*3/uL — ABNORMAL LOW (ref 150–400)
Platelets: 191 10*3/uL (ref 150–400)
RBC: 3.31 MIL/uL — ABNORMAL LOW (ref 3.87–5.11)
RDW: 13.2 % (ref 11.5–15.5)
RDW: 13.3 % (ref 11.5–15.5)
RDW: 13.3 % (ref 11.5–15.5)
WBC: 6 10*3/uL (ref 4.0–10.5)

## 2010-09-15 LAB — BUN: BUN: 16 mg/dL (ref 6–23)

## 2010-09-15 LAB — CREATININE, SERUM
Creatinine, Ser: 0.66 mg/dL (ref 0.4–1.2)
GFR calc Af Amer: >60 mL/min (ref >60)
GFR calc non Af Amer: >60 mL/min (ref >60)

## 2010-09-26 LAB — CBC
HCT: 36 % (ref 36.0–46.0)
HCT: 37.2 % (ref 36.0–46.0)
Hemoglobin: 12.9 g/dL (ref 12.0–15.0)
MCHC: 34 g/dL (ref 30.0–36.0)
MCHC: 34.7 g/dL (ref 30.0–36.0)
MCV: 93.1 fL (ref 78.0–100.0)
MCV: 94.3 fL (ref 78.0–100.0)
Platelets: 174 10*3/uL (ref 150–400)
RBC: 3.82 MIL/uL — ABNORMAL LOW (ref 3.87–5.11)
RBC: 4 MIL/uL (ref 3.87–5.11)
WBC: 4.8 10*3/uL (ref 4.0–10.5)
WBC: 6.1 10*3/uL (ref 4.0–10.5)

## 2010-09-26 LAB — DIFFERENTIAL
Basophils Relative: 1 % (ref 0–1)
Eosinophils Absolute: 0 10*3/uL (ref 0.0–0.7)
Eosinophils Relative: 1 % (ref 0–5)
Lymphs Abs: 1.8 10*3/uL (ref 0.7–4.0)
Monocytes Absolute: 0.4 10*3/uL (ref 0.1–1.0)
Monocytes Relative: 7 % (ref 3–12)

## 2010-09-26 LAB — POCT I-STAT, CHEM 8
Calcium, Ion: 1.21 mmol/L (ref 1.12–1.32)
Creatinine, Ser: 0.7 mg/dL (ref 0.4–1.2)
Glucose, Bld: 97 mg/dL (ref 70–99)
HCT: 38 % (ref 36.0–46.0)
Hemoglobin: 12.9 g/dL (ref 12.0–15.0)
Potassium: 4 mEq/L (ref 3.5–5.1)

## 2010-09-26 LAB — PROTIME-INR: INR: 1 (ref 0.00–1.49)

## 2010-09-26 LAB — LIPID PANEL
Cholesterol: 195 mg/dL (ref 0–200)
HDL: 54 mg/dL (ref >39)
Total CHOL/HDL Ratio: 3.6 RATIO
Triglycerides: 93 mg/dL (ref <150)

## 2010-09-26 LAB — POCT CARDIAC MARKERS: CKMB, poc: <1 ng/mL — ABNORMAL LOW (ref 1.0–8.0)

## 2010-09-26 LAB — CK TOTAL AND CKMB (NOT AT ARMC)
CK, MB: 2.1 ng/mL (ref 0.3–4.0)
Total CK: 59 U/L (ref 7–177)

## 2010-09-26 LAB — CARDIAC PANEL(CRET KIN+CKTOT+MB+TROPI): CK, MB: 2 ng/mL (ref 0.3–4.0)

## 2010-09-26 LAB — D-DIMER, QUANTITATIVE: D-Dimer, Quant: 0.49 ug/mL-FEU — ABNORMAL HIGH (ref 0.00–0.48)

## 2010-10-27 NOTE — Discharge Summary (Signed)
[name], Monica Strong NO.:  [id number]   MEDICAL RECORD NO.:  [id number]          PATIENT TYPE:  INP   LOCATION:  2928                         FACILITY:  [facility]   PHYSICIAN:  Monica Strong, M.D.     DATE OF BIRTH:  [birth year]   DATE OF ADMISSION:  07/15/2008  DATE OF DISCHARGE:  07/17/2008                               DISCHARGE SUMMARY   DISCHARGE DIAGNOSES:  1. Chest pain, resolved.  2. Abnormal EKG.  3. Arthritis.  4. Cataract  5. Enlarged prevascular lymph nodes felt to be benign, noncalcified      pulmonary nodules unchanged from 2007.  6. Hiatal hernia.  7. Status post hysterectomy.  8. Status post lumpectomy (cyst).  9. Status post eyelid surgery.  10.History of inner ear problems with vertigo.  11.Allergy to DEMEROL.   HOSPITAL COURSE:  Monica Strong is a 77 year old female who had a history  of chest pain about 2 years ago and since she saw Dr. Mayford Knife at that  time and said all her tests were normal.  Today on the day of  admission, she was getting ready to have cataract surgery and started  having chest pain radiating to her back and she was sent to the  emergency room and started on nitroglycerin paste and became pain-free.  Her EKG showed T-wave inversions inferolaterally.  She does complain  recent ingestion and epigastric discomfort, but this goes away with  Tums.  I was able to move on for a last adenosine Cardiolite and this  was performed in October 2008 that showed breast attenuation with a  normal EF.   Because of her symptoms, we were concerned to a CT of the chest, which  showed no evidence of pulmonary emboli.  There was a noncalcified  pulmonary nodules unchanged from 2007.  There was an enlarged  prevascular lymph node that was nonspecific.  The pulmonary nodules and  prevascular lymph node are most likely benign.  May consider CT followup  in 6-12 months to ensure stability.  This was apparently from my  understanding back to her  primary care doctor for follow up.  Her  primary care doctor is Dr. Nicholos Johns.   Other laboratory studies during her hospitalization include cardiac  isoenzymes, which were negative.  Total cholesterol 195, triglycerides  93, HDL 54, LDL 122, TSH 1.257.  D-dimer 0.49.   Because of her chest pain, we did perform another stress test, this  showed no evidence of inducible ischemia.  LV function was normal.  For  this reason, we felt safe for the patient to go home.  She was  discharged to home on July 17, 2008, in stable but improved  condition.   DISCHARGE MEDICATIONS:  1. Prilosec over the counter 2 times a day.  2. Baby aspirin 81 mg a day.   The patient is to remain on a low-sodium, heart-healthy diet.  Increase  activity slowly.  Follow up with Dr. Christiana Fuchs, nurse  practitioner will occur.  The office will call with an appointment.      Guy Franco, P.A.  Monica Strong, M.D.  Electronically Signed    LB/MEDQ  D:  08/26/2008  T:  08/27/2008  Job:  [id number]   cc:   Monica Strong, M.D.  Robert A. Nicholos Johns, M.D.

## 2011-08-17 ENCOUNTER — Other Ambulatory Visit (HOSPITAL_COMMUNITY): Payer: Self-pay | Admitting: Family Medicine

## 2011-08-17 DIAGNOSIS — R222 Localized swelling, mass and lump, trunk: Secondary | ICD-10-CM

## 2011-08-21 ENCOUNTER — Other Ambulatory Visit (HOSPITAL_COMMUNITY): Payer: Medicare Other

## 2011-08-22 ENCOUNTER — Ambulatory Visit (HOSPITAL_COMMUNITY)
Admission: RE | Admit: 2011-08-22 | Discharge: 2011-08-22 | Disposition: A | Discharge status: Home / Self Care | Payer: Medicare Other | Source: Ambulatory Visit | Attending: Family Medicine | Admitting: Family Medicine

## 2011-08-22 DIAGNOSIS — I2510 Atherosclerotic heart disease of native coronary artery without angina pectoris: Secondary | ICD-10-CM | POA: Insufficient documentation

## 2011-08-22 DIAGNOSIS — R911 Solitary pulmonary nodule: Secondary | ICD-10-CM | POA: Insufficient documentation

## 2011-08-22 DIAGNOSIS — J42 Unspecified chronic bronchitis: Secondary | ICD-10-CM | POA: Insufficient documentation

## 2011-08-22 DIAGNOSIS — R222 Localized swelling, mass and lump, trunk: Secondary | ICD-10-CM | POA: Insufficient documentation

## 2011-08-22 DIAGNOSIS — K449 Diaphragmatic hernia without obstruction or gangrene: Secondary | ICD-10-CM | POA: Insufficient documentation

## 2011-08-22 DIAGNOSIS — M479 Spondylosis, unspecified: Secondary | ICD-10-CM | POA: Insufficient documentation

## 2012-04-11 ENCOUNTER — Ambulatory Visit
Admission: RE | Admit: 2012-04-11 | Discharge: 2012-04-11 | Disposition: A | Discharge status: Home / Self Care | Payer: Medicare Other | Source: Ambulatory Visit | Attending: Family Medicine | Admitting: Family Medicine

## 2012-04-11 ENCOUNTER — Other Ambulatory Visit: Payer: Self-pay | Admitting: Family Medicine

## 2012-04-11 DIAGNOSIS — R319 Hematuria, unspecified: Secondary | ICD-10-CM

## 2012-04-11 DIAGNOSIS — R109 Unspecified abdominal pain: Secondary | ICD-10-CM

## 2012-04-15 ENCOUNTER — Other Ambulatory Visit: Payer: Self-pay | Admitting: Urology

## 2012-05-02 ENCOUNTER — Encounter (HOSPITAL_BASED_OUTPATIENT_CLINIC_OR_DEPARTMENT_OTHER): Payer: Self-pay | Admitting: *Deleted

## 2012-05-06 ENCOUNTER — Encounter (HOSPITAL_BASED_OUTPATIENT_CLINIC_OR_DEPARTMENT_OTHER): Payer: Self-pay | Admitting: *Deleted

## 2012-05-07 ENCOUNTER — Encounter (HOSPITAL_BASED_OUTPATIENT_CLINIC_OR_DEPARTMENT_OTHER): Payer: Self-pay | Admitting: *Deleted

## 2012-05-07 NOTE — Progress Notes (Addendum)
NPO AFTER MN. ARRIVES AT 0700. NEEDS ISTAT8 (due to gentamycin ordered) AND EKG. CURRENT CHEST CT IN EPIC AND CHART. WILL TAKE PRILOSEC AM OF SURG W/ SIP OF WATER.  PT ADVISED CAN OTC SINUS MED. WITHOUT ASA, IBUPROFEN OR NAPROXEN IN IT ONLY TYLENOL.

## 2012-05-14 ENCOUNTER — Ambulatory Visit (HOSPITAL_BASED_OUTPATIENT_CLINIC_OR_DEPARTMENT_OTHER)
Admission: RE | Admit: 2012-05-14 | Discharge: 2012-05-14 | Disposition: A | Discharge status: Home / Self Care | Payer: Medicare Other | Source: Ambulatory Visit | Attending: Urology | Admitting: Urology

## 2012-05-14 ENCOUNTER — Encounter (HOSPITAL_BASED_OUTPATIENT_CLINIC_OR_DEPARTMENT_OTHER): Payer: Self-pay | Admitting: Anesthesiology

## 2012-05-14 ENCOUNTER — Ambulatory Visit (HOSPITAL_BASED_OUTPATIENT_CLINIC_OR_DEPARTMENT_OTHER): Payer: Medicare Other | Admitting: Anesthesiology

## 2012-05-14 ENCOUNTER — Encounter (HOSPITAL_BASED_OUTPATIENT_CLINIC_OR_DEPARTMENT_OTHER): Payer: Self-pay

## 2012-05-14 ENCOUNTER — Encounter (HOSPITAL_BASED_OUTPATIENT_CLINIC_OR_DEPARTMENT_OTHER)
Admission: RE | Disposition: A | Discharge status: Home / Self Care | Payer: Self-pay | Source: Ambulatory Visit | Attending: Urology

## 2012-05-14 DIAGNOSIS — E669 Obesity, unspecified: Secondary | ICD-10-CM | POA: Insufficient documentation

## 2012-05-14 DIAGNOSIS — G4733 Obstructive sleep apnea (adult) (pediatric): Secondary | ICD-10-CM | POA: Insufficient documentation

## 2012-05-14 DIAGNOSIS — N200 Calculus of kidney: Secondary | ICD-10-CM | POA: Insufficient documentation

## 2012-05-14 DIAGNOSIS — Z96659 Presence of unspecified artificial knee joint: Secondary | ICD-10-CM | POA: Insufficient documentation

## 2012-05-14 DIAGNOSIS — K219 Gastro-esophageal reflux disease without esophagitis: Secondary | ICD-10-CM | POA: Insufficient documentation

## 2012-05-14 DIAGNOSIS — N281 Cyst of kidney, acquired: Secondary | ICD-10-CM | POA: Insufficient documentation

## 2012-05-14 HISTORY — DX: Calculus of kidney: N20.0

## 2012-05-14 HISTORY — DX: Gastro-esophageal reflux disease without esophagitis: K21.9

## 2012-05-14 HISTORY — DX: Other nonspecific abnormal finding of lung field: R91.8

## 2012-05-14 HISTORY — DX: Personal history of other diseases of the digestive system: Z87.19

## 2012-05-14 HISTORY — DX: Cardiac murmur, unspecified: R01.1

## 2012-05-14 HISTORY — DX: Urge incontinence: N39.41

## 2012-05-14 HISTORY — PX: CYSTOSCOPY WITH RETROGRADE PYELOGRAM, URETEROSCOPY AND STENT PLACEMENT: SHX5789

## 2012-05-14 HISTORY — PX: HOLMIUM LASER APPLICATION: SHX5852

## 2012-05-14 HISTORY — DX: Other specified disorders of nose and nasal sinuses: J34.89

## 2012-05-14 LAB — POCT I-STAT, CHEM 8
Chloride: 109 mEq/L (ref 96–112)
Creatinine, Ser: 0.9 mg/dL (ref 0.50–1.10)
Glucose, Bld: 107 mg/dL — ABNORMAL HIGH (ref 70–99)
HCT: 35 % — ABNORMAL LOW (ref 36.0–46.0)
Potassium: 3.5 mEq/L (ref 3.5–5.1)
Sodium: 143 mEq/L (ref 135–145)

## 2012-05-14 SURGERY — CYSTOURETEROSCOPY, WITH RETROGRADE PYELOGRAM AND STENT INSERTION
Anesthesia: General | Site: Ureter | Laterality: Left | Wound class: Clean Contaminated

## 2012-05-14 MED ORDER — FENTANYL CITRATE 0.05 MG/ML IJ SOLN
25.0000 ug | INTRAMUSCULAR | Status: DC | PRN
  Administered 2012-05-14 (×2): 25 ug via INTRAVENOUS
  Filled 2012-05-14: qty 1

## 2012-05-14 MED ORDER — KETOROLAC TROMETHAMINE 30 MG/ML IJ SOLN
15.0000 mg | Freq: Once | INTRAMUSCULAR | Status: DC | PRN
  Filled 2012-05-14: qty 1

## 2012-05-14 MED ORDER — PROPOFOL 10 MG/ML IV BOLUS
INTRAVENOUS | Status: DC | PRN
  Administered 2012-05-14: 200 mg via INTRAVENOUS

## 2012-05-14 MED ORDER — EPHEDRINE SULFATE 50 MG/ML IJ SOLN
INTRAMUSCULAR | Status: DC | PRN
  Administered 2012-05-14: 10 mg via INTRAVENOUS

## 2012-05-14 MED ORDER — PROMETHAZINE HCL 25 MG/ML IJ SOLN
6.2500 mg | INTRAMUSCULAR | Status: DC | PRN
  Administered 2012-05-14: 6.25 mg via INTRAVENOUS
  Filled 2012-05-14: qty 1

## 2012-05-14 MED ORDER — IOHEXOL 350 MG/ML SOLN
INTRAVENOUS | Status: DC | PRN
  Administered 2012-05-14: 25 mL

## 2012-05-14 MED ORDER — STERILE WATER FOR IRRIGATION IR SOLN
Status: DC | PRN
  Administered 2012-05-14: 1

## 2012-05-14 MED ORDER — SODIUM CHLORIDE 0.9 % IR SOLN
Status: DC | PRN
  Administered 2012-05-14: 12000 mL

## 2012-05-14 MED ORDER — LIDOCAINE HCL (CARDIAC) 20 MG/ML IV SOLN
INTRAVENOUS | Status: DC | PRN
  Administered 2012-05-14: 75 mg via INTRAVENOUS

## 2012-05-14 MED ORDER — GENTAMICIN IN SALINE 1.6-0.9 MG/ML-% IV SOLN
80.0000 mg | INTRAVENOUS | Status: DC
  Filled 2012-05-14: qty 50

## 2012-05-14 MED ORDER — ONDANSETRON HCL 4 MG/2ML IJ SOLN
INTRAMUSCULAR | Status: DC | PRN
  Administered 2012-05-14: 4 mg via INTRAVENOUS

## 2012-05-14 MED ORDER — SENNA-DOCUSATE SODIUM 8.6-50 MG PO TABS: 1.0000 | ORAL_TABLET | Freq: Two times a day (BID) | ORAL | Status: DC

## 2012-05-14 MED ORDER — DEXAMETHASONE SODIUM PHOSPHATE 4 MG/ML IJ SOLN
INTRAMUSCULAR | Status: DC | PRN
  Administered 2012-05-14: 8 mg via INTRAVENOUS

## 2012-05-14 MED ORDER — TRAMADOL HCL 50 MG PO TABS: 50.0000 mg | ORAL_TABLET | Freq: Four times a day (QID) | ORAL | Status: DC | PRN

## 2012-05-14 MED ORDER — MIRABEGRON ER 50 MG PO TB24: 50.0000 mg | ORAL_TABLET | Freq: Every day | ORAL | Status: DC | PRN

## 2012-05-14 MED ORDER — GENTAMICIN SULFATE 40 MG/ML IJ SOLN
350.0000 mg | INTRAVENOUS | Status: AC
  Administered 2012-05-14: 350 mg via INTRAVENOUS
  Filled 2012-05-14: qty 8.75

## 2012-05-14 MED ORDER — FENTANYL CITRATE 0.05 MG/ML IJ SOLN
INTRAMUSCULAR | Status: DC | PRN
  Administered 2012-05-14 (×4): 25 ug via INTRAVENOUS
  Administered 2012-05-14 (×2): 50 ug via INTRAVENOUS
  Administered 2012-05-14 (×2): 25 ug via INTRAVENOUS
  Administered 2012-05-14: 50 ug via INTRAVENOUS

## 2012-05-14 MED ORDER — LACTATED RINGERS IV SOLN
INTRAVENOUS | Status: DC
  Administered 2012-05-14 (×3): via INTRAVENOUS
  Filled 2012-05-14: qty 1000

## 2012-05-14 SURGICAL SUPPLY — 48 items
ADAPTER CATH URET PLST 4-6FR (CATHETERS) IMPLANT
ADPR CATH URET STRL DISP 4-6FR (CATHETERS)
BAG DRAIN URO-CYSTO SKYTR STRL (DRAIN) ×3 IMPLANT
BAG DRN UROCATH (DRAIN) ×2
BAG URO CATCHER STRL LF (DRAPE) ×3 IMPLANT
BASKET LASER NITINOL 1.9FR (BASKET) ×6 IMPLANT
BASKET STNLS GEMINI 4WIRE 3FR (BASKET) IMPLANT
BASKET ZERO TIP NITINOL 2.4FR (BASKET) IMPLANT
BRUSH URET BIOPSY 3F (UROLOGICAL SUPPLIES) IMPLANT
BSKT STON RTRVL 120 1.9FR (BASKET) ×4
BSKT STON RTRVL GEM 120X11 3FR (BASKET)
BSKT STON RTRVL ZERO TP 2.4FR (BASKET)
CANISTER SUCT LVC 12 LTR MEDI- (MISCELLANEOUS) ×3 IMPLANT
CATH FOLEY 2WAY  3CC  8FR (CATHETERS) ×1
CATH FOLEY 2WAY 3CC 8FR (CATHETERS) ×2 IMPLANT
CATH INTERMIT  6FR 70CM (CATHETERS) ×3 IMPLANT
CATH URET 5FR 28IN CONE TIP (BALLOONS)
CATH URET 5FR 28IN OPEN ENDED (CATHETERS) IMPLANT
CATH URET 5FR 70CM CONE TIP (BALLOONS) IMPLANT
CLOTH BEACON ORANGE TIMEOUT ST (SAFETY) ×3 IMPLANT
DRAPE CAMERA CLOSED 9X96 (DRAPES) ×3 IMPLANT
ELECT REM PT RETURN 9FT ADLT (ELECTROSURGICAL)
ELECTRODE REM PT RTRN 9FT ADLT (ELECTROSURGICAL) IMPLANT
GLOVE BIO SURGEON STRL SZ7 (GLOVE) ×3 IMPLANT
GLOVE BIO SURGEON STRL SZ7.5 (GLOVE) ×3 IMPLANT
GLOVE BIOGEL PI IND STRL 6.5 (GLOVE) ×2 IMPLANT
GLOVE BIOGEL PI INDICATOR 6.5 (GLOVE) ×1
GLOVE ECLIPSE 6.0 STRL STRAW (GLOVE) ×3 IMPLANT
GOWN PREVENTION PLUS LG XLONG (DISPOSABLE) IMPLANT
GOWN PREVENTION PLUS XLARGE (GOWN DISPOSABLE) IMPLANT
GOWN STRL NON-REIN LRG LVL3 (GOWN DISPOSABLE) ×6 IMPLANT
GOWN STRL REIN XL XLG (GOWN DISPOSABLE) IMPLANT
GUIDEWIRE 0.038 PTFE COATED (WIRE) IMPLANT
GUIDEWIRE ANG ZIPWIRE 038X150 (WIRE) ×3 IMPLANT
GUIDEWIRE STR DUAL SENSOR (WIRE) ×3 IMPLANT
IV NS IRRIG 3000ML ARTHROMATIC (IV SOLUTION) ×12 IMPLANT
KIT BALLIN UROMAX 15FX10 (LABEL) IMPLANT
KIT BALLN UROMAX 15FX4 (MISCELLANEOUS) IMPLANT
KIT BALLN UROMAX 26 75X4 (MISCELLANEOUS)
PACK CYSTOSCOPY (CUSTOM PROCEDURE TRAY) ×3 IMPLANT
SET HIGH PRES BAL DIL (LABEL)
SHEATH ACCESS URETERAL 38CM (SHEATH) ×3 IMPLANT
SHEATH URET ACCESS 12FR/35CM (UROLOGICAL SUPPLIES) IMPLANT
SHEATH URET ACCESS 12FR/55CM (UROLOGICAL SUPPLIES) IMPLANT
STENT URET 6FRX24 CONTOUR (STENTS) ×3 IMPLANT
SYRINGE 10CC LL (SYRINGE) ×3 IMPLANT
SYRINGE IRR TOOMEY STRL 70CC (SYRINGE) ×3 IMPLANT
TUBE FEEDING 8FR 16IN STR KANG (MISCELLANEOUS) IMPLANT

## 2012-05-14 NOTE — Transfer of Care (Signed)
Immediate Anesthesia Transfer of Care Note  Patient: Monica Strong  Procedure(s) Performed: Procedure(s) (LRB): CYSTOSCOPY WITH RETROGRADE PYELOGRAM, URETEROSCOPY AND STENT PLACEMENT (Left) HOLMIUM LASER APPLICATION (Left)  Patient Location: Patient transported to PACU with oxygen via face mask at 4 Liters / Min  Anesthesia Type: General  Level of Consciousness: awake and alert   Airway & Oxygen Therapy: Patient Spontanous Breathing and Patient connected to face mask oxygen  Post-op Assessment: Report given to PACU RN and Post -op Vital signs reviewed and stable  Post vital signs: Reviewed and stable  Dentition: Teeth and oropharynx remain in pre-op condition  Complications: No apparent anesthesia complications

## 2012-05-14 NOTE — H&P (Signed)
Monica Strong is an 78 y.o. female.   Chief Complaint: Pre-Op Left 1st Stage Ureteroscopic Stone Manipulation HPI:   1 - Left Flank Pain / Renal Stones - Pt wtih multifocal left renal stones sized 8mm, 13mm, 12mm all approx 400-600HU found on w/u of colicky left flank pain 04/11/2012 by pt's PCP. No fevers or infectious parameters. No hydro. The largest stone (medial) apears to be very near UPJ and may be intermitantly obstrucing, while the other two are clearly more lateral and within calyces. There is significantly more stone burden than previous CT 2010 at which time only a single 7mm stone was seen.  2 - Bilateral Peripelvic Cysts - Pt with Lt > Rt peripelvic cysts incidnetal on CT as per above. No complex features. Stable comparted to CT 2010.  PMH sig for obesity, mild dementia (some paranoid delusions), benign hysterectomy. No CV disese. No strong blood thinners.  Today Monica Strong is seen for first stage left ureteroscopic stone manipulation. Denies interval fevers.  Most recent UCX from our office negative.   Past Medical History  Diagnosis Date  . Renal stones left  . GERD (gastroesophageal reflux disease)   . H/O hiatal hernia   . OSA on CPAP   . Pulmonary nodules BENIGN--  MONITORED BY PCP DR READE--  ASYMPTOMATIC     LAST CHEST CT 08-17-2011  . Heart murmur MILD -- ASYMPTOMATIC  . Urge urinary incontinence   . Sinus drainage     Past Surgical History  Procedure Date  . Total knee arthroplasty 10-17-2009  DR ALUISIO    LEFT  . Right total knee arthroplasty 04-25-2009  . Tonsillectomy   . Cardiovascular stress test 03-13-2007  dr Gloris [place] turner    NORMAL LV SIZE SYSTOLIC FUCTION/ NO ISCHEMIA/ EF 85%  . Transthoracic echocardiogram 08-18-2008    NORMAL LVSF/ EF 55-60%/ MILD MITRAL REGURG .  Marland Kitchen Appendectomy     PT STATES PER XRAY SMALL AMOUNT OF APPENDIX LEFT  . Removal breast cyst, benign 1960'S  . Vaginal hysterectomy 1970  . Cataract extraction w/ intraocular lens  implant,  bilateral   . Blepharoplasty     BILATERAL    History reviewed. No pertinent family history. Social History:  reports that she has never smoked. She has never used smokeless tobacco. She reports that she does not drink alcohol or use illicit drugs.  Allergies:  Allergies  Allergen Reactions  . Diphenhydramine Other (See Comments)    UNKNOWN  . Flagyl (Metronidazole) Other (See Comments)    HALLUCINATIONS  . Hydrocodone Other (See Comments)    HALLUCINATIONS  . Meperidine Hcl Nausea And Vomiting  . Penicillins Hives  . Sulfonamide Derivatives Hives    No prescriptions prior to admission    No results found for this or any previous visit (from the past 48 hour(s)). No results found.  Review of Systems  Constitutional: Negative.  Negative for fever and chills.  HENT: Negative.   Eyes: Negative.   Respiratory: Negative.   Cardiovascular: Negative.   Gastrointestinal: Negative.   Genitourinary: Positive for flank pain. Negative for dysuria and urgency.  Musculoskeletal: Negative.   Skin: Negative.   Neurological: Negative.   Endo/Heme/Allergies: Negative.   Psychiatric/Behavioral: Positive for memory loss.    Height 5\' 2"  (1.575 m), weight 90.719 kg (200 lb). Physical Exam  Constitutional: She is oriented to person, place, and time. She appears well-developed and well-nourished.       obese  HENT:  Head: Normocephalic and atraumatic.  Eyes: EOM  are normal. Pupils are equal, round, and reactive to light.  Neck: Normal range of motion.  Cardiovascular: Normal rate.   Respiratory: Effort normal.  GI: Soft. Bowel sounds are normal.  Genitourinary:       Minimal Lt CVAT  Musculoskeletal: Normal range of motion.  Neurological: She is alert and oriented to person, place, and time.  Skin: Skin is warm and dry.  Psychiatric: She has a normal mood and affect. Her behavior is normal. Judgment and thought content normal.     Assessment/Plan 1 - Left Flank Pain / Renal  Stones - Proceed with left first stage ureteroscopic stone manipulation  I re-iterated ureteroscopic stone manipulation with basketing and laser-lithotripsy in detail.  We discussed risks including bleeding, infection, damage to kidney / ureter  bladder, rarely loss of kidney. We discussed anesthetic risks and rare but serious surgical complications including DVT, PE, MI, and mortality. We specifically addressed that in 5-10% of cases a staged approach is required with stenting followed by re-attempt ureteroscopy if anatomy unfavorable. The patient voiced understanding and wises to proceed.    2 - Bilateral Peripelvic Cysts - No complex features and stable. No specific surveillance warranted.  Saadiq Poche 05/14/2012, 6:38 AM

## 2012-05-14 NOTE — Anesthesia Procedure Notes (Signed)
Procedure Name: LMA Insertion Date/Time: 05/14/2012 8:32 AM Performed by: Fran Lowes Pre-anesthesia Checklist: Patient identified, Emergency Drugs available, Suction available and Patient being monitored Patient Re-evaluated:Patient Re-evaluated prior to inductionOxygen Delivery Method: Circle System Utilized Preoxygenation: Pre-oxygenation with 100% oxygen Intubation Type: IV induction Ventilation: Mask ventilation without difficulty LMA: LMA inserted LMA Size: 4.0 Number of attempts: 1 Airway Equipment and Method: bite block Placement Confirmation: positive ETCO2 Tube secured with: Tape Dental Injury: Teeth and Oropharynx as per pre-operative assessment

## 2012-05-14 NOTE — Anesthesia Postprocedure Evaluation (Signed)
  Anesthesia Post-op Note  Patient: Monica Strong  Procedure(s) Performed: Procedure(s) (LRB): CYSTOSCOPY WITH RETROGRADE PYELOGRAM, URETEROSCOPY AND STENT PLACEMENT (Left) HOLMIUM LASER APPLICATION (Left)  Patient Location: PACU  Anesthesia Type: General  Level of Consciousness: awake and alert   Airway and Oxygen Therapy: Patient Spontanous Breathing  Post-op Pain: mild  Post-op Assessment: Post-op Vital signs reviewed, Patient's Cardiovascular Status Stable, Respiratory Function Stable, Patent Airway and No signs of Nausea or vomiting  Last Vitals:  Filed Vitals:   05/14/12 1100  Temp: 36 C    Post-op Vital Signs: stable   Complications: No apparent anesthesia complications

## 2012-05-14 NOTE — Anesthesia Preprocedure Evaluation (Addendum)
Anesthesia Evaluation  Patient identified by MRN, date of birth, ID band Patient awake    Reviewed: Allergy & Precautions, H&P , NPO status , Patient's Chart, lab work & pertinent test results  Airway Mallampati: III TM Distance: <3 FB Neck ROM: Full    Dental No notable dental hx. (+) Dental Advisory Given   Pulmonary sleep apnea ,  breath sounds clear to auscultation  + decreased breath sounds      Cardiovascular negative cardio ROS  Rhythm:Regular Rate:Normal     Neuro/Psych negative neurological ROS  negative psych ROS   GI/Hepatic Neg liver ROS, GERD-  Medicated,  Endo/Other  Morbid obesity  Renal/GU negative Renal ROS  negative genitourinary   Musculoskeletal negative musculoskeletal ROS (+)   Abdominal   Peds negative pediatric ROS (+)  Hematology negative hematology ROS (+)   Anesthesia Other Findings   Reproductive/Obstetrics negative OB ROS                          Anesthesia Physical Anesthesia Plan  ASA: III  Anesthesia Plan: General   Post-op Pain Management:    Induction: Intravenous  Airway Management Planned: LMA  Additional Equipment:   Intra-op Plan:   Post-operative Plan:   Informed Consent: I have reviewed the patients History and Physical, chart, labs and discussed the procedure including the risks, benefits and alternatives for the proposed anesthesia with the patient or authorized representative who has indicated his/her understanding and acceptance.   Dental advisory given  Plan Discussed with: CRNA and Surgeon  Anesthesia Plan Comments:         Anesthesia Quick Evaluation

## 2012-05-14 NOTE — Brief Op Note (Signed)
05/14/2012  10:48 AM  PATIENT:  Monica Strong  78 y.o. female  PRE-OPERATIVE DIAGNOSIS:  LARGE LEFT RENAL STONES  POST-OPERATIVE DIAGNOSIS:  Large left renal stones  PROCEDURE:  Procedure(s) (LRB) with comments: CYSTOSCOPY WITH RETROGRADE PYELOGRAM, URETEROSCOPY AND STENT PLACEMENT (Left) - 1ST STAGE LEFT URETEROSCOPY, LEFT RETROGRADE, DIGITAL URETEROSCOPY,  LEFT STONE REMOVAL WITH ESCAPE BASKET,  STENT PLACEMENt  HOLMIUM LASER APPLICATION (Left)  SURGEON:  Surgeon(s) and Role:    * Sebastian Ache, MD - Primary  PHYSICIAN ASSISTANT:   ASSISTANTS: none   ANESTHESIA:   general  EBL:  Total I/O In: 1000 [I.V.:1000] Out: -   BLOOD ADMINISTERED:none  DRAINS: none   LOCAL MEDICATIONS USED:  NONE  SPECIMEN:  Source of Specimen:  left kidney - stone  DISPOSITION OF SPECIMEN:  majority to pt, small potion to [facility] for compositional analysis  COUNTS:  YES  TOURNIQUET:  * No tourniquets in log *  DICTATION: .Other Dictation: Dictation Number [id number]  PLAN OF CARE: Discharge to home after PACU  PATIENT DISPOSITION:  PACU - hemodynamically stable.   Delay start of Pharmacological VTE agent (>24hrs) due to surgical blood loss or risk of bleeding: no

## 2012-05-15 ENCOUNTER — Encounter (HOSPITAL_BASED_OUTPATIENT_CLINIC_OR_DEPARTMENT_OTHER): Payer: Self-pay | Admitting: Urology

## 2012-05-15 NOTE — Op Note (Unsigned)
NAME:  GENEVIVE, PRINTUP NO.:  [id number]  MEDICAL RECORD NO.:  [id number]  LOCATION:                               FACILITY:  [facility]  PHYSICIAN:  Sebastian Ache, MD     DATE OF BIRTH:  1931/09/13  DATE OF PROCEDURE:  05/14/2012 DATE OF DISCHARGE:  05/14/2012                              OPERATIVE REPORT   DIAGNOSIS:  Large volume left renal stone.  PROCEDURE: 1. Left first stage ureteroscopic stone manipulation. 2. Left retrograde pyelogram interpretation. 3. Placement of left ureteral stent, 6 x 24, no tether.  COMPLICATIONS:  None.  SPECIMEN:  Left renal stone for composition analysis.  DRAINS:  None.  DATE OF SURGERY:  May 14, 2012.  INDICATION:  Ms. Maes is obese elderly lady, who on workup of left flank pain was found to have increasing volume of left renal and proximal ureteral stones.  She had previous imaging several years ago and new imaging revealed increasing volume of stone on this side with likely intermittent obstruction from UPJ portion.  The entire stone volume was approximately 2 cm.  Options were discussed for management including observation versus staged ureteroscopy versus percutaneous approach and the patient wished to proceed with a staged ureteroscopy. Informed consent was obtained and placed in medical record.  PROCEDURE IN DETAIL:  The patient being Hannalee Castor, was verified. Procedure being left ureteroscopic manipulation.  First stage was confirmed.  Procedure was carried out.  Time-out was performed. Intravenous antibiotics administered.  General LMA anesthesia was introduced.  The patient was placed into a low lithotomy position. Sterile field was created by prepping and draping the patient's vagina, introitus, and proximal thighs using iodine x3.  Next, cystourethroscopy was performed using a 22-French rigid cystoscope with a 30-degrees lens. Inspection of the urinary bladder revealed no diverticula, calcifications,  papular lesions.  The left ureteral orifice was then gently cannulated using a 6-French end-hole catheter and left retrograde pyelogram was obtained.  Left retrograde pyelogram demonstrated a single left ureter, single system left kidney.  There were multiple filling defects within the kidney system with known stone.  A 0.038 Glidewire was advanced to the level of the upper pole and set aside as a safety wire.  A 10-French Foley catheter was placed to the level of urinary bladder for pressure release.  The scope was exchanged for the 6.4-French semi-rigid ureteroscope using normal saline.  Under pressure semi-rigid ureteroscopy was performed to the entire length of the left ureter alongside a separate Sensor working wire.  No mucosal abnormalities or calcifications were noted.  Next the semi-rigid ureteroscope was exchanged for a 12/14 38-cm ureteral access sheath under continuous fluoroscopy over the Sensor wire to the level of the proximal ureter. Flexible ureteroscopy was then performed of the proximal ureter and systematic inspection of each calix was performed.  There was a very large volume of stone within the kidney spreading over at least 3 calices.  In the upper pole is a conglomeration of 100s of very small stones.  Similar conglomeration was found in the midpole and a single large dominant stone within the lower pole.  Attention was directed to the upper pole.  A  sequential basketing was performed using an escape type basket, removing these fragments in their entirety and setting aside for compositional analysis.  The upper pole of calyx was completely cleared as was the mid pole calyx.  This resulted in countless passes with the basket and retrieval of 100s of small fragments.  The patient had been in lithotomy position for greater than 2-1/2 hours and visualization was becoming somewhat more difficult due to the prolongation of the procedure and it was felt that it would  be safe to conclude the procedure today and procedure with second stage at a later time.  The ureteral access sheath was then removed under continuous ureteroscopy and no mucosal abnormalities were found. Finally a new 6 x 24 double-J stent was placed over the remaining safety wire.  Good proximal and distal curl were noted.  Efflux of urine was seen around and through the distal end of the stent.  Bladder was emptied per cystoscope.  Procedure was then terminated.  The patient tolerated the procedure well.  There were no immediate periprocedural complications.  The patient was taken to postanesthesia care unit in stable condition.          ______________________________ Sebastian Ache, MD     TM/MEDQ  D:  05/14/2012  T:  05/14/2012  Job:  [id number]

## 2012-05-26 ENCOUNTER — Other Ambulatory Visit: Payer: Self-pay | Admitting: Urology

## 2012-06-12 ENCOUNTER — Encounter (HOSPITAL_BASED_OUTPATIENT_CLINIC_OR_DEPARTMENT_OTHER): Payer: Self-pay | Admitting: *Deleted

## 2012-06-12 NOTE — Progress Notes (Signed)
Pt instructed npo p mn x prilosec w sip of water. To [facility] 06/15/11 @ 0845. Needs hgb on arrival.  ekg in epic

## 2012-06-13 ENCOUNTER — Encounter (HOSPITAL_BASED_OUTPATIENT_CLINIC_OR_DEPARTMENT_OTHER): Payer: Self-pay | Admitting: Anesthesiology

## 2012-06-13 ENCOUNTER — Ambulatory Visit (HOSPITAL_BASED_OUTPATIENT_CLINIC_OR_DEPARTMENT_OTHER): Payer: Medicare Other | Admitting: Anesthesiology

## 2012-06-13 ENCOUNTER — Encounter (HOSPITAL_BASED_OUTPATIENT_CLINIC_OR_DEPARTMENT_OTHER): Payer: Self-pay | Admitting: *Deleted

## 2012-06-13 ENCOUNTER — Ambulatory Visit (HOSPITAL_BASED_OUTPATIENT_CLINIC_OR_DEPARTMENT_OTHER)
Admission: RE | Admit: 2012-06-13 | Discharge: 2012-06-13 | Disposition: A | Discharge status: Home / Self Care | Payer: Medicare Other | Source: Ambulatory Visit | Attending: Urology | Admitting: Urology

## 2012-06-13 ENCOUNTER — Encounter (HOSPITAL_BASED_OUTPATIENT_CLINIC_OR_DEPARTMENT_OTHER)
Admission: RE | Disposition: A | Discharge status: Home / Self Care | Payer: Self-pay | Source: Ambulatory Visit | Attending: Urology

## 2012-06-13 DIAGNOSIS — N200 Calculus of kidney: Secondary | ICD-10-CM | POA: Insufficient documentation

## 2012-06-13 DIAGNOSIS — Z01812 Encounter for preprocedural laboratory examination: Secondary | ICD-10-CM | POA: Insufficient documentation

## 2012-06-13 HISTORY — DX: Dysuria: R30.0

## 2012-06-13 HISTORY — PX: CYSTOSCOPY W/ URETERAL STENT PLACEMENT: SHX1429

## 2012-06-13 HISTORY — PX: HOLMIUM LASER APPLICATION: SHX5852

## 2012-06-13 HISTORY — DX: Unspecified osteoarthritis, unspecified site: M19.90

## 2012-06-13 HISTORY — PX: CYSTOSCOPY/RETROGRADE/URETEROSCOPY/STONE EXTRACTION WITH BASKET: SHX5317

## 2012-06-13 HISTORY — DX: Obstructive sleep apnea (adult) (pediatric): G47.33

## 2012-06-13 HISTORY — DX: Presence of urogenital implants: Z96.0

## 2012-06-13 HISTORY — DX: Myoneural disorder, unspecified: G70.9

## 2012-06-13 SURGERY — HOLMIUM LASER APPLICATION
Anesthesia: General | Site: Ureter | Laterality: Left | Wound class: Clean Contaminated

## 2012-06-13 MED ORDER — ONDANSETRON HCL 4 MG/2ML IJ SOLN
INTRAMUSCULAR | Status: DC | PRN
  Administered 2012-06-13: 4 mg via INTRAVENOUS

## 2012-06-13 MED ORDER — KETOROLAC TROMETHAMINE 30 MG/ML IJ SOLN
INTRAMUSCULAR | Status: DC | PRN
  Administered 2012-06-13: 30 mg via INTRAVENOUS

## 2012-06-13 MED ORDER — PROPOFOL 10 MG/ML IV BOLUS
INTRAVENOUS | Status: DC | PRN
  Administered 2012-06-13: 150 mg via INTRAVENOUS

## 2012-06-13 MED ORDER — DEXAMETHASONE SODIUM PHOSPHATE 4 MG/ML IJ SOLN
INTRAMUSCULAR | Status: DC | PRN
  Administered 2012-06-13: 4 mg via INTRAVENOUS

## 2012-06-13 MED ORDER — FENTANYL CITRATE 0.05 MG/ML IJ SOLN
25.0000 ug | INTRAMUSCULAR | Status: DC | PRN
  Filled 2012-06-13: qty 1

## 2012-06-13 MED ORDER — MIRABEGRON ER 50 MG PO TB24: 50.0000 mg | ORAL_TABLET | Freq: Every day | ORAL | Status: DC | PRN

## 2012-06-13 MED ORDER — SODIUM CHLORIDE 0.9 % IV SOLN
145.8000 mg | INTRAVENOUS | Status: DC | PRN
  Administered 2012-06-13: 80 mg via INTRAVENOUS

## 2012-06-13 MED ORDER — IOHEXOL 300 MG/ML  SOLN
INTRAMUSCULAR | Status: DC | PRN
  Administered 2012-06-13: 2 mL

## 2012-06-13 MED ORDER — LACTATED RINGERS IV SOLN
INTRAVENOUS | Status: DC
  Administered 2012-06-13 (×2): via INTRAVENOUS
  Filled 2012-06-13: qty 1000

## 2012-06-13 MED ORDER — EPHEDRINE SULFATE 50 MG/ML IJ SOLN
INTRAMUSCULAR | Status: DC | PRN
  Administered 2012-06-13: 10 mg via INTRAVENOUS

## 2012-06-13 MED ORDER — SODIUM CHLORIDE 0.9 % IR SOLN
Status: DC | PRN
  Administered 2012-06-13: 9000 mL

## 2012-06-13 MED ORDER — TRAMADOL HCL 50 MG PO TABS: 50.0000 mg | ORAL_TABLET | Freq: Four times a day (QID) | ORAL | Status: DC | PRN

## 2012-06-13 MED ORDER — FENTANYL CITRATE 0.05 MG/ML IJ SOLN
INTRAMUSCULAR | Status: DC | PRN
  Administered 2012-06-13 (×3): 25 ug via INTRAVENOUS
  Administered 2012-06-13: 50 ug via INTRAVENOUS
  Administered 2012-06-13 (×2): 25 ug via INTRAVENOUS

## 2012-06-13 MED ORDER — LACTATED RINGERS IV SOLN
INTRAVENOUS | Status: DC
  Filled 2012-06-13: qty 1000

## 2012-06-13 MED ORDER — PROMETHAZINE HCL 25 MG/ML IJ SOLN
6.2500 mg | INTRAMUSCULAR | Status: DC | PRN
  Filled 2012-06-13: qty 1

## 2012-06-13 MED ORDER — LIDOCAINE HCL (CARDIAC) 20 MG/ML IV SOLN
INTRAVENOUS | Status: DC | PRN
  Administered 2012-06-13: 60 mg via INTRAVENOUS

## 2012-06-13 SURGICAL SUPPLY — 32 items
ADAPTER CATH URET PLST 4-6FR (CATHETERS) ×3 IMPLANT
ADPR CATH URET STRL DISP 4-6FR (CATHETERS) ×2
BAG DRAIN URO-CYSTO SKYTR STRL (DRAIN) ×3 IMPLANT
BAG DRN UROCATH (DRAIN) ×2
BAG URO CATCHER STRL LF (DRAPE) ×3 IMPLANT
BASKET LASER NITINOL 1.9FR (BASKET) ×6 IMPLANT
BASKET STNLS GEMINI 4WIRE 3FR (BASKET) IMPLANT
BASKET ZERO TIP NITINOL 2.4FR (BASKET) IMPLANT
BSKT STON RTRVL 120 1.9FR (BASKET) ×4
BSKT STON RTRVL GEM 120X11 3FR (BASKET)
BSKT STON RTRVL ZERO TP 2.4FR (BASKET)
CANISTER SUCT LVC 12 LTR MEDI- (MISCELLANEOUS) ×3 IMPLANT
CATH FOLEY 2WAY  3CC  8FR (CATHETERS) ×1
CATH FOLEY 2WAY 3CC 8FR (CATHETERS) ×2 IMPLANT
CATH INTERMIT  6FR 70CM (CATHETERS) ×3 IMPLANT
CLOTH BEACON ORANGE TIMEOUT ST (SAFETY) ×3 IMPLANT
DRAPE CAMERA CLOSED 9X96 (DRAPES) ×3 IMPLANT
GLOVE BIO SURGEON STRL SZ 6.5 (GLOVE) ×3 IMPLANT
GLOVE BIO SURGEON STRL SZ7 (GLOVE) ×3 IMPLANT
GLOVE BIO SURGEON STRL SZ7.5 (GLOVE) ×3 IMPLANT
GLOVE INDICATOR 7.0 STRL GRN (GLOVE) ×3 IMPLANT
GOWN PREVENTION PLUS LG XLONG (DISPOSABLE) ×3 IMPLANT
GOWN STRL REIN XL XLG (GOWN DISPOSABLE) ×3 IMPLANT
GUIDEWIRE ANG ZIPWIRE 038X150 (WIRE) ×3 IMPLANT
GUIDEWIRE STR DUAL SENSOR (WIRE) ×3 IMPLANT
IV NS IRRIG 3000ML ARTHROMATIC (IV SOLUTION) ×9 IMPLANT
PACK CYSTOSCOPY (CUSTOM PROCEDURE TRAY) ×3 IMPLANT
SHEATH ACCESS URETERAL 38CM (SHEATH) ×3 IMPLANT
STENT CONTOUR 6FRX24X.038 (STENTS) IMPLANT
STENT URET 6FRX24 CONTOUR (STENTS) ×3 IMPLANT
SYRINGE 10CC LL (SYRINGE) ×3 IMPLANT
SYRINGE IRR TOOMEY STRL 70CC (SYRINGE) IMPLANT

## 2012-06-13 NOTE — H&P (Signed)
Monica Strong is an 79 y.o. female.   Chief Complaint: Pre-Op Second Stage Left Ureteroscopic Stone Manipulation HPI:   1 - Left Flank Pain / Renal Stones - Pt wtih multifocal left renal stones sized 8mm, 13mm, 12mm all approx 400-600HU found on w/u of colicky left flank pain 04/11/2012 by pt's PCP. No fevers or infectious parameters. No hydro. The largest stone (medial) apears to be very near UPJ and may be intermitantly obstrucing, while the other two are clearly more lateral and within calyces. There is significantly more stone burden than previous CT 2010 at which time only a single 7mm stone was seen.  Pt underwent first stage left ureteroscopic stone manipulation 05/14/12 at which time approximately 60% of stone burden removed. She now presents for second stage procedure. Most recent UCX negative. She has had some mild stent colic as expected. Composition form last stage mostly uric acid.   PMH sig for obesity, mild dementia (some paranoid delusions), benign hysterectomy. No CV disese. No strong blood thinners.  Past Medical History  Diagnosis Date  . Renal stones left  . GERD (gastroesophageal reflux disease)   . H/O hiatal hernia   . Pulmonary nodules BENIGN--  MONITORED BY PCP DR READE--  ASYMPTOMATIC     LAST CHEST CT 08-17-2011  . Heart murmur MILD -- ASYMPTOMATIC  . Urge urinary incontinence   . Sinus drainage   . Neuromuscular disorder     rt hand numbness  . Dysuria   . Ureteral stent retained   . Arthritis   . OSA on CPAP   . OSA (obstructive sleep apnea)     not using cpap    Past Surgical History  Procedure Date  . Total knee arthroplasty 10-17-2009  DR ALUISIO    LEFT  . Right total knee arthroplasty 04-25-2009  . Tonsillectomy   . Cardiovascular stress test 03-13-2007  dr Gloris [place] turner    NORMAL LV SIZE SYSTOLIC FUCTION/ NO ISCHEMIA/ EF 85%  . Transthoracic echocardiogram 08-18-2008    NORMAL LVSF/ EF 55-60%/ MILD MITRAL REGURG .  Marland Kitchen Appendectomy     PT STATES  PER XRAY SMALL AMOUNT OF APPENDIX LEFT  . Removal breast cyst, benign 1960'S  . Vaginal hysterectomy 1970  . Cataract extraction w/ intraocular lens  implant, bilateral   . Blepharoplasty     BILATERAL  . Cystoscopy with retrograde pyelogram, ureteroscopy and stent placement 05/14/2012    Procedure: CYSTOSCOPY WITH RETROGRADE PYELOGRAM, URETEROSCOPY AND STENT PLACEMENT;  Surgeon: Sebastian Ache, MD;  Location: [facility];  Service: Urology;  Laterality: Left;  1ST STAGE LEFT URETEROSCOPY, LEFT RETROGRADE, DIGITAL URETEROSCOPY,  LEFT STONE REMOVAL WITH ESCAPE BASKET,  STENT PLACEMENt   . Holmium laser application 05/14/2012    Procedure: HOLMIUM LASER APPLICATION;  Surgeon: Sebastian Ache, MD;  Location: [facility];  Service: Urology;  Laterality: Left;    History reviewed. No pertinent family history. Social History:  reports that she has never smoked. She has never used smokeless tobacco. She reports that she does not drink alcohol or use illicit drugs.  Allergies:  Allergies  Allergen Reactions  . Diphenhydramine Other (See Comments)    UNKNOWN  . Flagyl (Metronidazole) Other (See Comments)    HALLUCINATIONS  . Hydrocodone Other (See Comments)    HALLUCINATIONS  . Meperidine Hcl Nausea And Vomiting  . Penicillins Hives  . Sulfonamide Derivatives Hives    No prescriptions prior to admission    No results found for this or any  previous visit (from the past 48 hour(s)). No results found.  Review of Systems  Constitutional: Negative.  Negative for fever and chills.  HENT: Negative.   Eyes: Negative.   Respiratory: Negative.   Cardiovascular: Negative.   Gastrointestinal: Negative.  Negative for nausea and vomiting.  Genitourinary: Positive for hematuria and flank pain. Negative for dysuria.  Musculoskeletal: Negative.   Skin: Negative.   Neurological: Negative.   Endo/Heme/Allergies: Negative.   Psychiatric/Behavioral: Negative.      Height 5\' 2"  (1.575 m), weight 90.719 kg (200 lb). Physical Exam  Constitutional: She is oriented to person, place, and time. She appears well-developed and well-nourished.       Morbid obesity  HENT:  Head: Normocephalic and atraumatic.  Eyes: EOM are normal. Pupils are equal, round, and reactive to light.  Neck: Normal range of motion. Neck supple.  Cardiovascular: Normal rate.   Respiratory: Effort normal.  GI: Soft. Bowel sounds are normal.  Genitourinary:       Minimal Left CVAT  Musculoskeletal: Normal range of motion.  Neurological: She is alert and oriented to person, place, and time.  Skin: Skin is warm and dry.  Psychiatric: She has a normal mood and affect. Her behavior is normal. Judgment and thought content normal.     Assessment/Plan  1 - Left Flank Pain / Renal Stones - Likely cause of flank pain, Considerable incrased stone burden over past 3 years. Density favors possible Urate or struvite. I recomended either staged URS with goal of stone free or SWL aimed at UPJ stone with goal of symptom free. PCNL not first choice given age, comorbidity, and non-dilated system.  Did well with 1st stage procedure.   We re-discussed ureteroscopic stone manipulation with basketing and laser-lithotripsy in detail.  We discussed risks including bleeding, infection, damage to kidney / ureter  bladder, rarely loss of kidney. We discussed anesthetic risks and rare but serious surgical complications including DVT, PE, MI, and mortality. We specifically addressed that in 5-10% of cases a staged approach is required with stenting followed by re-attempt ureteroscopy if anatomy unfavorable. The patient voiced understanding and wises to proceed with second stage today.   I informed her I am optimistic but can not guarantee that we can get her stone free today and there is always possibility of needing additional procedure. I also adressed that her uric acid composition is usually amenable to  metabolic therapy and re-iterated the importance of this going forward.    Drue Harr 06/13/2012, 6:28 AM

## 2012-06-13 NOTE — Transfer of Care (Signed)
Immediate Anesthesia Transfer of Care Note  Patient: ELEASE SWARM  Procedure(s) Performed: Procedure(s) (LRB): HOLMIUM LASER APPLICATION (Left) CYSTOSCOPY/RETROGRADE/URETEROSCOPY/STONE EXTRACTION WITH BASKET (Left) CYSTOSCOPY WITH STENT REPLACEMENT (Left)  Patient Location: PACU  Anesthesia Type: General  Level of Consciousness: awake, oriented, sedated and patient cooperative  Airway & Oxygen Therapy: Patient Spontanous Breathing and Patient connected to face mask oxygen  Post-op Assessment: Report given to PACU RN and Post -op Vital signs reviewed and stable  Post vital signs: Reviewed and stable  Complications: No apparent anesthesia complications

## 2012-06-13 NOTE — Anesthesia Postprocedure Evaluation (Signed)
Anesthesia Post Note  Patient: Monica Strong  Procedure(s) Performed: Procedure(s) (LRB): HOLMIUM LASER APPLICATION (Left) CYSTOSCOPY/RETROGRADE/URETEROSCOPY/STONE EXTRACTION WITH BASKET (Left) CYSTOSCOPY WITH STENT REPLACEMENT (Left)  Anesthesia type: General  Patient location: PACU  Post pain: Pain level controlled  Post assessment: Post-op Vital signs reviewed  Last Vitals:  Filed Vitals:   06/13/12 1240  BP:   Pulse:   Temp:   Resp: 14    Post vital signs: Reviewed  Level of consciousness: sedated  Complications: No apparent anesthesia complications

## 2012-06-13 NOTE — Anesthesia Preprocedure Evaluation (Addendum)
Anesthesia Evaluation  Patient identified by MRN, date of birth, ID band Patient awake    Reviewed: Allergy & Precautions, H&P , NPO status , Patient's Chart, lab work & pertinent test results  Airway Mallampati: III TM Distance: <3 FB Neck ROM: Full    Dental No notable dental hx. (+) Dental Advisory Given and Partial Upper   Pulmonary sleep apnea (Noncompliant with CPAP) ,  breath sounds clear to auscultation  + decreased breath sounds      Cardiovascular + Valvular Problems/Murmurs Rhythm:Regular Rate:Normal     Neuro/Psych  Neuromuscular disease negative neurological ROS  negative psych ROS   GI/Hepatic Neg liver ROS, hiatal hernia, GERD-  Medicated,  Endo/Other  Morbid obesity  Renal/GU Renal disease  negative genitourinary   Musculoskeletal negative musculoskeletal ROS (+)   Abdominal   Peds  Hematology negative hematology ROS (+)   Anesthesia Other Findings   Reproductive/Obstetrics negative OB ROS                         Anesthesia Physical Anesthesia Plan  ASA: II  Anesthesia Plan: General   Post-op Pain Management:    Induction: Intravenous  Airway Management Planned: LMA  Additional Equipment:   Intra-op Plan:   Post-operative Plan: Extubation in OR  Informed Consent: I have reviewed the patients History and Physical, chart, labs and discussed the procedure including the risks, benefits and alternatives for the proposed anesthesia with the patient or authorized representative who has indicated his/her understanding and acceptance.   Dental advisory given  Plan Discussed with: CRNA  Anesthesia Plan Comments: (Patient states she is very sensitive to anesthetic agents and is slow to awaken.)       Anesthesia Quick Evaluation

## 2012-06-13 NOTE — Anesthesia Procedure Notes (Signed)
Procedure Name: LMA Insertion Date/Time: 06/13/2012 9:55 AM Performed by: Renella Cunas D Pre-anesthesia Checklist: Patient identified, Emergency Drugs available, Suction available and Patient being monitored Patient Re-evaluated:Patient Re-evaluated prior to inductionOxygen Delivery Method: Circle System Utilized Preoxygenation: Pre-oxygenation with 100% oxygen Intubation Type: IV induction Ventilation: Mask ventilation without difficulty LMA: LMA inserted LMA Size: 4.0 Number of attempts: 1 Airway Equipment and Method: bite block Placement Confirmation: positive ETCO2 Tube secured with: Tape Dental Injury: Teeth and Oropharynx as per pre-operative assessment

## 2012-06-13 NOTE — Brief Op Note (Signed)
06/13/2012  12:21 PM  PATIENT:  Monica Strong  79 y.o. female  PRE-OPERATIVE DIAGNOSIS:  LEFT RENAL STONE CYST  POST-OPERATIVE DIAGNOSIS:  LEFT RENAL STONE CYST  PROCEDURE:  Procedure(s) (LRB) with comments: HOLMIUM LASER APPLICATION (Left) CYSTOSCOPY/RETROGRADE/URETEROSCOPY/STONE EXTRACTION WITH BASKET (Left) CYSTOSCOPY WITH STENT REPLACEMENT (Left)  SURGEON:  Surgeon(s) and Role:    * Sebastian Ache, MD - Primary  PHYSICIAN ASSISTANT:   ASSISTANTS: none   ANESTHESIA:   general  EBL:  Total I/O In: 1000 [I.V.:1000] Out: -   BLOOD ADMINISTERED:none  DRAINS: none   LOCAL MEDICATIONS USED:  NONE  SPECIMEN:  Source of Specimen:  Left Kidney - Stone  DISPOSITION OF SPECIMEN:  Given to pt's family  COUNTS:  YES  TOURNIQUET:  * No tourniquets in log *  DICTATION: .Other Dictation: Dictation Number  [id number]  PLAN OF CARE: Discharge to home after PACU  PATIENT DISPOSITION:  PACU - hemodynamically stable.   Delay start of Pharmacological VTE agent (>24hrs) due to surgical blood loss or risk of bleeding: no

## 2012-06-16 NOTE — Op Note (Signed)
NAME:  Monica Strong, Monica Strong NO.:  [id number]  MEDICAL RECORD NO.:  [id number]  LOCATION:                                 FACILITY:  PHYSICIAN:  Sebastian Ache, MD          DATE OF BIRTH:  DATE OF PROCEDURE:  06/13/2012 DATE OF DISCHARGE:                              OPERATIVE REPORT   DIAGNOSIS:  Large volume left renal stone.  PROCEDURE: 1. Left second stage ureteroscopic stone manipulation. 2. Left retrograde pyelogram interpretation. 3. Exchange of left ureteral stent 6 x 24, no tether.  SPECIMENS:  Left renal stone fragments.  COMPLICATIONS:  None.  FINDINGS:  Large volume left multifocal residual stone most concentrated in the renal pelvis and the left mid pole.  INDICATIONS:  Monica Strong is a pleasant 79 year old lady with history of left flank pain and recurrent urinary tract infection.  She was found on workup of this to have large volume of left renal stone.  Options were discussed including observation versus percutaneous approach versus staged ureteroscopy, and she wished to proceed with the latter. Informed consent was obtained and placed in the medical record. Notably, she had her 1st stage procedure done on May 14, 2012, at which point significant amount of stone was removed, however, she was known to have residual stone with plan for multi-stage approach and she presents today for second-stage procedure.  Informed consent was obtained and placed in the medical record.  PROCEDURE IN DETAIL:  The patient being Monica Strong, procedure being left second stage ureteroscopic stone manipulation was confirmed. Procedure was carried out.  Time-out was performed.  Intravenous antibiotics administered.  General LMA anesthesia was introduced.  The patient was placed into a low lithotomy position.  Sterile field created by prepping and draping the patient's vagina, introitus, and proximal thigh using iodine x3.  Next, cystourethroscopy was performed  using a 22- French rigid cystoscope with 12-degree offset lens.  Inspection of the bladder revealed no diverticula, calcifications, papular lesions. Distal end of the left stent was seen, grasped at the level of the urethral meatus through which a 0.038 Glidewire was advanced at the level of the upper pole, this was exchanged for a 6-French end-hole catheter and left retrograde pyelogram was obtained.  Left retrograde pyelogram demonstrated single left ureter with single system left kidney.  There was a filling defect in the renal pelvis consistent with known stone.  There was also a density on scout images in the mid pole consistent with residual stone.  No hydronephrosis was seen.  The Glidewire was once again advanced to the pole and set aside as a safety wire.  Next, the cystoscope was exchanged for a 6.4-French semi-rigid ureteroscope.  A semi-rigid ureteroscopy was performed of the entire length of the left ureter.  No mucosal abnormalities or calcifications were noted.  A 0.038 Sensor wire was advanced and the semi-rigid ureteroscope was exchanged for a 35 cm 12/14 ureteral access sheath under continuous fluoroscopy to the level of the UPJ.  Next, flexible ureteroscopy was performed of the entire left kidney.  There were two large conglomeration of stone, one in the renal pelvis and other in a  midpole calyx.  Attention was initially directed to the mid pole stone.  Holmium laser energy was applied to the stone using 0.5 joules and 5 Hz fragmenting the stone into fragments 4 mm or less in diameter.  These were grasped with an escape-type basket and brought out in their entirety.  Attention was then directed at the stone in the mid pole calyx.  There was a very long infundibulum to this stone.  Holmium laser energy was applied, fragmenting the stone into numerous fragments, which were 4 mm or less in diameter.  These fragments were removed and set aside for compositional analysis.   At this point of the procedure, the patient had been in lithotomy position for approximately 3 hours. There was significant volume of residual stone.  Ureteroscopic vision was suboptimal due to prolonged instrumentation and it was felt that safest way to proceed would be to stop the procedure today and plan for a 3rd stage.  As such, the access sheath was removed under continuous ureteroscopic vision.  No mucosal abnormalities were found.  A new 6 x 24 double-J stent was placed with remaining safety wire and good proximal and distal curl were noted.  Bladder was emptied with cystoscope.  Procedure was then terminated.  The patient tolerated the procedure well.  There were no immediate periprocedural complications. The patient was taken to postanesthesia care unit in a stable condition.          ______________________________ Sebastian Ache, MD     TM/MEDQ  D:  06/13/2012  T:  06/13/2012  Job:  [id number]

## 2012-06-17 ENCOUNTER — Encounter (HOSPITAL_BASED_OUTPATIENT_CLINIC_OR_DEPARTMENT_OTHER): Payer: Self-pay | Admitting: Urology

## 2012-06-17 ENCOUNTER — Other Ambulatory Visit: Payer: Self-pay | Admitting: Urology

## 2012-06-18 LAB — POCT HEMOGLOBIN-HEMACUE: Hemoglobin: 12.4 g/dL (ref 12.0–15.0)

## 2012-07-07 ENCOUNTER — Encounter (HOSPITAL_BASED_OUTPATIENT_CLINIC_OR_DEPARTMENT_OTHER): Payer: Self-pay | Admitting: *Deleted

## 2012-07-07 NOTE — Progress Notes (Signed)
Pt instructed npo p mn 1/30 x prilosec w sip of water. To [facility] 1 /31 @ 0615. Needs hgb on arrival

## 2012-07-11 ENCOUNTER — Ambulatory Visit (HOSPITAL_BASED_OUTPATIENT_CLINIC_OR_DEPARTMENT_OTHER): Payer: Medicare Other | Admitting: Anesthesiology

## 2012-07-11 ENCOUNTER — Encounter (HOSPITAL_BASED_OUTPATIENT_CLINIC_OR_DEPARTMENT_OTHER): Payer: Self-pay | Admitting: *Deleted

## 2012-07-11 ENCOUNTER — Ambulatory Visit (HOSPITAL_BASED_OUTPATIENT_CLINIC_OR_DEPARTMENT_OTHER)
Admission: RE | Admit: 2012-07-11 | Discharge: 2012-07-11 | Disposition: A | Discharge status: Home / Self Care | Payer: Medicare Other | Source: Ambulatory Visit | Attending: Urology | Admitting: Urology

## 2012-07-11 ENCOUNTER — Encounter (HOSPITAL_BASED_OUTPATIENT_CLINIC_OR_DEPARTMENT_OTHER): Payer: Self-pay | Admitting: Anesthesiology

## 2012-07-11 ENCOUNTER — Encounter (HOSPITAL_BASED_OUTPATIENT_CLINIC_OR_DEPARTMENT_OTHER)
Admission: RE | Disposition: A | Discharge status: Home / Self Care | Payer: Self-pay | Source: Ambulatory Visit | Attending: Urology

## 2012-07-11 DIAGNOSIS — G4733 Obstructive sleep apnea (adult) (pediatric): Secondary | ICD-10-CM | POA: Insufficient documentation

## 2012-07-11 DIAGNOSIS — N200 Calculus of kidney: Secondary | ICD-10-CM | POA: Insufficient documentation

## 2012-07-11 DIAGNOSIS — N201 Calculus of ureter: Secondary | ICD-10-CM | POA: Insufficient documentation

## 2012-07-11 DIAGNOSIS — Z87440 Personal history of urinary (tract) infections: Secondary | ICD-10-CM | POA: Insufficient documentation

## 2012-07-11 DIAGNOSIS — K219 Gastro-esophageal reflux disease without esophagitis: Secondary | ICD-10-CM | POA: Insufficient documentation

## 2012-07-11 HISTORY — PX: URETEROSCOPY: SHX842

## 2012-07-11 HISTORY — PX: CYSTOSCOPY W/ RETROGRADES: SHX1426

## 2012-07-11 HISTORY — PX: CYSTOSCOPY W/ URETERAL STENT REMOVAL: SHX1430

## 2012-07-11 LAB — POCT I-STAT, CHEM 8
BUN: 20 mg/dL (ref 6–23)
Hemoglobin: 11.6 g/dL — ABNORMAL LOW (ref 12.0–15.0)
Potassium: 4.1 mEq/L (ref 3.5–5.1)
Sodium: 144 mEq/L (ref 135–145)
TCO2: 28 mmol/L (ref 0–100)

## 2012-07-11 SURGERY — REMOVAL, STENT, URETER, CYSTOSCOPIC
Anesthesia: General | Site: Ureter | Laterality: Left | Wound class: Clean Contaminated

## 2012-07-11 MED ORDER — EPHEDRINE SULFATE 50 MG/ML IJ SOLN
INTRAMUSCULAR | Status: DC | PRN
  Administered 2012-07-11: 10 mg via INTRAVENOUS
  Administered 2012-07-11: 5 mg via INTRAVENOUS
  Administered 2012-07-11: 10 mg via INTRAVENOUS

## 2012-07-11 MED ORDER — ONDANSETRON HCL 4 MG/2ML IJ SOLN
INTRAMUSCULAR | Status: DC | PRN
  Administered 2012-07-11: 4 mg via INTRAVENOUS

## 2012-07-11 MED ORDER — SODIUM CHLORIDE 0.9 % IR SOLN
Status: DC | PRN
  Administered 2012-07-11: 6000 mL

## 2012-07-11 MED ORDER — IOHEXOL 350 MG/ML SOLN
INTRAVENOUS | Status: DC | PRN
  Administered 2012-07-11: 10 mL

## 2012-07-11 MED ORDER — FENTANYL CITRATE 0.05 MG/ML IJ SOLN
INTRAMUSCULAR | Status: DC | PRN
  Administered 2012-07-11 (×3): 25 ug via INTRAVENOUS
  Administered 2012-07-11: 100 ug via INTRAVENOUS
  Administered 2012-07-11: 25 ug via INTRAVENOUS

## 2012-07-11 MED ORDER — DEXAMETHASONE SODIUM PHOSPHATE 4 MG/ML IJ SOLN
INTRAMUSCULAR | Status: DC | PRN
  Administered 2012-07-11: 10 mg via INTRAVENOUS

## 2012-07-11 MED ORDER — KETOROLAC TROMETHAMINE 30 MG/ML IJ SOLN
INTRAMUSCULAR | Status: DC | PRN
  Administered 2012-07-11: 15 mg via INTRAVENOUS

## 2012-07-11 MED ORDER — TRAMADOL HCL 50 MG PO TABS: 50.0000 mg | ORAL_TABLET | Freq: Four times a day (QID) | ORAL | Status: DC | PRN

## 2012-07-11 MED ORDER — GENTAMICIN IN SALINE 1.6-0.9 MG/ML-% IV SOLN
80.0000 mg | INTRAVENOUS | Status: AC
  Administered 2012-07-11: 340 mg via INTRAVENOUS
  Filled 2012-07-11: qty 50

## 2012-07-11 MED ORDER — LACTATED RINGERS IV SOLN
INTRAVENOUS | Status: DC
  Administered 2012-07-11: 07:00:00 via INTRAVENOUS
  Filled 2012-07-11: qty 1000

## 2012-07-11 MED ORDER — FENTANYL CITRATE 0.05 MG/ML IJ SOLN
25.0000 ug | INTRAMUSCULAR | Status: DC | PRN
  Filled 2012-07-11: qty 1

## 2012-07-11 MED ORDER — LIDOCAINE HCL (CARDIAC) 20 MG/ML IV SOLN
INTRAVENOUS | Status: DC | PRN
  Administered 2012-07-11: 60 mg via INTRAVENOUS

## 2012-07-11 MED ORDER — SENNA-DOCUSATE SODIUM 8.6-50 MG PO TABS: 1.0000 | ORAL_TABLET | Freq: Two times a day (BID) | ORAL | Status: DC

## 2012-07-11 MED ORDER — PROPOFOL 10 MG/ML IV BOLUS
INTRAVENOUS | Status: DC | PRN
  Administered 2012-07-11: 120 mg via INTRAVENOUS

## 2012-07-11 SURGICAL SUPPLY — 50 items
ADAPTER CATH URET PLST 4-6FR (CATHETERS) ×3 IMPLANT
ADPR CATH URET STRL DISP 4-6FR (CATHETERS) ×2
BAG DRAIN URO-CYSTO SKYTR STRL (DRAIN) ×3 IMPLANT
BAG DRN UROCATH (DRAIN) ×2
BAG URO CATCHER STRL LF (DRAPE) ×3 IMPLANT
BASKET LASER NITINOL 1.9FR (BASKET) ×3 IMPLANT
BASKET STNLS GEMINI 4WIRE 3FR (BASKET) IMPLANT
BASKET ZERO TIP NITINOL 2.4FR (BASKET) IMPLANT
BRUSH URET BIOPSY 3F (UROLOGICAL SUPPLIES) IMPLANT
BSKT STON RTRVL 120 1.9FR (BASKET) ×2
BSKT STON RTRVL GEM 120X11 3FR (BASKET)
BSKT STON RTRVL ZERO TP 2.4FR (BASKET)
CANISTER SUCT LVC 12 LTR MEDI- (MISCELLANEOUS) ×3 IMPLANT
CATH FOLEY 2WAY  3CC  8FR (CATHETERS)
CATH FOLEY 2WAY 3CC 8FR (CATHETERS) IMPLANT
CATH INTERMIT  6FR 70CM (CATHETERS) ×3 IMPLANT
CATH URET 5FR 28IN CONE TIP (BALLOONS)
CATH URET 5FR 28IN OPEN ENDED (CATHETERS) IMPLANT
CATH URET 5FR 70CM CONE TIP (BALLOONS) IMPLANT
CLOTH BEACON ORANGE TIMEOUT ST (SAFETY) ×3 IMPLANT
DRAPE CAMERA CLOSED 9X96 (DRAPES) ×3 IMPLANT
ELECT REM PT RETURN 9FT ADLT (ELECTROSURGICAL)
ELECTRODE REM PT RTRN 9FT ADLT (ELECTROSURGICAL) IMPLANT
GLOVE BIO SURGEON STRL SZ 6.5 (GLOVE) ×3 IMPLANT
GLOVE BIO SURGEON STRL SZ7 (GLOVE) ×3 IMPLANT
GLOVE BIO SURGEON STRL SZ7.5 (GLOVE) ×3 IMPLANT
GLOVE BIOGEL PI IND STRL 6.5 (GLOVE) ×2 IMPLANT
GLOVE BIOGEL PI INDICATOR 6.5 (GLOVE) ×1
GLOVE ECLIPSE 6.0 STRL STRAW (GLOVE) ×3 IMPLANT
GLOVE SKINSENSE NS SZ6.5 (GLOVE) ×1
GLOVE SKINSENSE STRL SZ6.5 (GLOVE) ×2 IMPLANT
GOWN PREVENTION PLUS LG XLONG (DISPOSABLE) ×6 IMPLANT
GOWN PREVENTION PLUS XLARGE (GOWN DISPOSABLE) ×3 IMPLANT
GOWN STRL NON-REIN LRG LVL3 (GOWN DISPOSABLE) ×6 IMPLANT
GOWN STRL REIN XL XLG (GOWN DISPOSABLE) ×3 IMPLANT
GUIDEWIRE 0.038 PTFE COATED (WIRE) IMPLANT
GUIDEWIRE ANG ZIPWIRE 038X150 (WIRE) ×3 IMPLANT
GUIDEWIRE STR DUAL SENSOR (WIRE) ×3 IMPLANT
IV NS IRRIG 3000ML ARTHROMATIC (IV SOLUTION) ×6 IMPLANT
KIT BALLIN UROMAX 15FX10 (LABEL) IMPLANT
KIT BALLN UROMAX 15FX4 (MISCELLANEOUS) IMPLANT
KIT BALLN UROMAX 26 75X4 (MISCELLANEOUS)
PACK CYSTOSCOPY (CUSTOM PROCEDURE TRAY) ×3 IMPLANT
SET HIGH PRES BAL DIL (LABEL)
SHEATH ACCESS URETERAL 24CM (SHEATH) ×3 IMPLANT
SHEATH URET ACCESS 12FR/35CM (UROLOGICAL SUPPLIES) IMPLANT
SHEATH URET ACCESS 12FR/55CM (UROLOGICAL SUPPLIES) IMPLANT
SYRINGE 10CC LL (SYRINGE) ×3 IMPLANT
SYRINGE IRR TOOMEY STRL 70CC (SYRINGE) IMPLANT
TUBE FEEDING 8FR 16IN STR KANG (MISCELLANEOUS) ×3 IMPLANT

## 2012-07-11 NOTE — Transfer of Care (Signed)
Immediate Anesthesia Transfer of Care Note  Patient: Monica Strong  Procedure(s) Performed: Procedure(s) (LRB): CYSTOSCOPY WITH STENT REMOVAL (Left) CYSTOSCOPY WITH RETROGRADE PYELOGRAM (Left) URETEROSCOPY (Left)  Patient Location: PACU  Anesthesia Type: General  Level of Consciousness: awake, alert  and oriented  Airway & Oxygen Therapy: Patient Spontanous Breathing and Patient connected to face mask oxygen  Post-op Assessment: Report given to PACU RN and Post -op Vital signs reviewed and stable  Post vital signs: Reviewed and stable  Complications: No apparent anesthesia complications

## 2012-07-11 NOTE — Anesthesia Preprocedure Evaluation (Signed)
Anesthesia Evaluation  Patient identified by MRN, date of birth, ID band Patient awake    Reviewed: Allergy & Precautions, H&P , NPO status , Patient's Chart, lab work & pertinent test results, reviewed documented beta blocker date and time   Airway Mallampati: II TM Distance: >3 FB Neck ROM: full    Dental No notable dental hx.    Pulmonary sleep apnea and Continuous Positive Airway Pressure Ventilation ,  breath sounds clear to auscultation  Pulmonary exam normal       Cardiovascular Exercise Tolerance: Good + Valvular Problems/Murmurs Rhythm:regular Rate:Normal     Neuro/Psych  Neuromuscular disease negative psych ROS   GI/Hepatic Neg liver ROS, hiatal hernia, GERD-  Medicated,  Endo/Other  negative endocrine ROS  Renal/GU Renal disease  negative genitourinary   Musculoskeletal   Abdominal   Peds  Hematology negative hematology ROS (+)   Anesthesia Other Findings   Reproductive/Obstetrics negative OB ROS                           Anesthesia Physical Anesthesia Plan  ASA: II  Anesthesia Plan: General LMA   Post-op Pain Management:    Induction:   Airway Management Planned:   Additional Equipment:   Intra-op Plan:   Post-operative Plan:   Informed Consent: I have reviewed the patients History and Physical, chart, labs and discussed the procedure including the risks, benefits and alternatives for the proposed anesthesia with the patient or authorized representative who has indicated his/her understanding and acceptance.   Dental Advisory Given  Plan Discussed with: CRNA  Anesthesia Plan Comments:         Anesthesia Quick Evaluation

## 2012-07-11 NOTE — Anesthesia Postprocedure Evaluation (Signed)
  Anesthesia Post-op Note  Patient: Monica Strong  Procedure(s) Performed: Procedure(s) (LRB): CYSTOSCOPY WITH STENT REMOVAL (Left) CYSTOSCOPY WITH RETROGRADE PYELOGRAM (Left) URETEROSCOPY (Left)  Patient Location: PACU  Anesthesia Type: General  Level of Consciousness: awake and alert   Airway and Oxygen Therapy: Patient Spontanous Breathing  Post-op Pain: mild  Post-op Assessment: Post-op Vital signs reviewed, Patient's Cardiovascular Status Stable, Respiratory Function Stable, Patent Airway and No signs of Nausea or vomiting  Last Vitals:  Filed Vitals:   07/11/12 0934  BP: 124/69  Pulse: 78  Temp: 35.8 C  Resp: 20    Post-op Vital Signs: stable   Complications: No apparent anesthesia complications

## 2012-07-11 NOTE — Op Note (Signed)
NAME:  Monica Strong, Monica Strong NO.:  [id number]  MEDICAL RECORD NO.:  [id number]  LOCATION:                                 FACILITY:  PHYSICIAN:  Sebastian Ache, MD     DATE OF BIRTH:  [birth year]  DATE OF PROCEDURE:  07/11/2012 DATE OF DISCHARGE:                              OPERATIVE REPORT   PREOPERATIVE DIAGNOSES:  Residual left renal stone, history of recurring UTI, and left flank pain.  PROCEDURE: 1. Left third stage ureteroscopic stone manipulation with basketing of     ureteral stone. 2. Left retrograde pyelogram interpretation. 3. Left ureteral stent removal.  FINDINGS: 1. Small volume left intrarenal stone fragments approximately 8 mm     total. 2. Single left distal ureteral stone approximately 6 mm.  All of these     were removed to simple basketing. 3. Otherwise unremarkable left retrograde pyelogram.  ESTIMATED BLOOD LOSS:  Nil.  SPECIMEN:  Left renal and ureteral stones given to the patient.  INDICATIONS:  Monica Strong is a pleasant 79 year old lady with a history of left flank pain and recurrent urinary tract infections.  She was found on workup of this to have large volume of left intrarenal stone. Options were discussed including observation versus lithotripsy versus percutaneous approach versus a staged ureteroscopy and she wished to proceed with the latter.  She had her first stage of procedure on May 24, 2012, second stage on June 13, 2012, and now presents for a third and final stage procedure to address her left-sided nephrolithiasis.  Informed consent was obtained and placed in the medical record.  PROCEDURE IN DETAIL:  The patient being Monica Strong, procedure being left ureteroscopic stone manipulation.  Third stage was confirmed. Procedure was carried out.  Time-out was performed.  Intravenous antibiotics administered.  General LMA anesthesia was introduced.  The patient was placed into a low lithotomy position.  Sterile  field was created by prepping and draping the patient's vagina, introitus, and proximal thighs using iodine x3.  Next, cystourethroscopy was performed using a 22-French rigid cystoscope with 12 degree offset lens.  The patient's urinary bladder revealed no diverticula, calcifications, papular lesions.  Distal end of the left ureteral stent was in situ. This was completely brought out in its entirety and set aside for discard.  It was completely intact.  Next, the left ureteral orifice was gently cannulated using a 6-French end-hole catheter and left retrograde pyelogram was seen.  Left retrograde pyelogram demonstrated a single left ureter single system left kidney without filling defects or narrowing.  A 0.038 Glidewire was advanced at the level of the upper pole and a safety wire. A semi-rigid ureteroscopy was performed of the entire length of the left ureter alongside a second sensor working wire.  The single distal calcification in the ureter and this was grasped with the basket and brought out in its entirety.  Next, the semi-rigid ureteroscope was exchanged to a 12/14 and 24 cm ureteral access.  Sheath was placed under continuous fluoroscopy at the level of the proximal ureter.  Next, a flexible ureteroscopy was performed using 8-French digital ureteroscope of the proximal ureter and each calix.  There was  a conglomeration of small stone fragments in the upper pole, approximately 8 mm total. These were grasped and brought out in their entirety.  Repeat inspection revealed excellent hemostasis.  No residual stone fragments.  The ureteral access sheath was then removed under continuous ureteroscopic vision.  No mucosal abnormalities were found.  It was felt that the patient had been pre-stented and there was a minimal manipulation today that no stenting was required.  As such, the bladder was emptied per cystoscope and the procedure was then terminated.  The patient tolerated the  procedure well.  There were no immediate periprocedural complications.  The patient was taken to postanesthesia care unit in a stable condition.  During all ureteroscopic portions of the procedure, a small 8-French feeding tube was placed in the urinary bladder for pressure release.          ______________________________ Sebastian Ache, MD     TM/MEDQ  D:  07/11/2012  T:  07/11/2012  Job:  [id number]

## 2012-07-11 NOTE — H&P (Signed)
Monica Strong is an 79 y.o. female.   Chief Complaint: Pre-op Left 3rd stage Ureteroscopic Stone Manipulation  HPI:  1 - Left Flank Pain / Renal Stones - Pt wtih multifocal left renal stones sized 8mm, 13mm, 12mm all approx 400-600HU found on w/u of colicky left flank pain 04/11/2012 by pt's PCP. No fevers or infectious parameters. No hydro. The largest stone (medial) apears to be very near UPJ and may be intermitantly obstrucing, while the other two are clearly more lateral and within calyces. There is significantly more stone burden than previous CT 2010 at which time only a single 7mm stone was seen. She has elected management with staged ureteroscopy over percutaneous approach given her age and comorbidity.  Pt underwent first stage left ureteroscopic stone manipulation 05/14/12, second stage procedure on 06/13/12 and now presents for third  stage procedure. Most recent UCX negative. She has had some mild stent colic as expected. Composition form last stage mostly uric acid with some component of Calcium oxalate as well.   PMH sig for obesity, mild dementia (some paranoid delusions), benign hysterectomy. No CV disese. No strong blood thinners.   Past Medical History  Diagnosis Date  . Renal stones left  . GERD (gastroesophageal reflux disease)   . H/O hiatal hernia   . Pulmonary nodules BENIGN--  MONITORED BY PCP DR READE--  ASYMPTOMATIC     LAST CHEST CT 08-17-2011  . Heart murmur MILD -- ASYMPTOMATIC  . Urge urinary incontinence   . Sinus drainage   . Neuromuscular disorder     rt hand numbness  . Dysuria     has urethral bump  . Ureteral stent retained   . Arthritis   . OSA on CPAP   . OSA (obstructive sleep apnea)     not using cpap    Past Surgical History  Procedure Date  . Total knee arthroplasty 10-17-2009  DR ALUISIO    LEFT  . Right total knee arthroplasty 04-25-2009  . Tonsillectomy   . Cardiovascular stress test 03-13-2007  dr Gloris [place] turner    NORMAL LV SIZE  SYSTOLIC FUCTION/ NO ISCHEMIA/ EF 85%  . Transthoracic echocardiogram 08-18-2008    NORMAL LVSF/ EF 55-60%/ MILD MITRAL REGURG .  Marland Kitchen Removal breast cyst, benign 1960'S  . Vaginal hysterectomy 1970  . Cataract extraction w/ intraocular lens  implant, bilateral   . Blepharoplasty     BILATERAL  . Cystoscopy with retrograde pyelogram, ureteroscopy and stent placement 05/14/2012    Procedure: CYSTOSCOPY WITH RETROGRADE PYELOGRAM, URETEROSCOPY AND STENT PLACEMENT;  Surgeon: Sebastian Ache, MD;  Location: [facility];  Service: Urology;  Laterality: Left;  1ST STAGE LEFT URETEROSCOPY, LEFT RETROGRADE, DIGITAL URETEROSCOPY,  LEFT STONE REMOVAL WITH ESCAPE BASKET,  STENT PLACEMENt   . Holmium laser application 05/14/2012    Procedure: HOLMIUM LASER APPLICATION;  Surgeon: Sebastian Ache, MD;  Location: [facility];  Service: Urology;  Laterality: Left;  . Holmium laser application 06/13/2012    Procedure: HOLMIUM LASER APPLICATION;  Surgeon: Sebastian Ache, MD;  Location: [facility];  Service: Urology;  Laterality: Left;  . Cystoscopy/retrograde/ureteroscopy/stone extraction with basket 06/13/2012    Procedure: CYSTOSCOPY/RETROGRADE/URETEROSCOPY/STONE EXTRACTION WITH BASKET;  Surgeon: Sebastian Ache, MD;  Location: [facility];  Service: Urology;  Laterality: Left;  . Cystoscopy w/ ureteral stent placement 06/13/2012    Procedure: CYSTOSCOPY WITH STENT REPLACEMENT;  Surgeon: Sebastian Ache, MD;  Location: [facility];  Service: Urology;  Laterality: Left;  .  Appendectomy     PT STATES PER XRAY SMALL AMOUNT OF APPENDIX LEFT    History reviewed. No pertinent family history. Social History:  reports that she has never smoked. She has never used smokeless tobacco. She reports that she does not drink alcohol or use illicit drugs.  Allergies:  Allergies  Allergen Reactions  . Diphenhydramine Other (See Comments)    UNKNOWN  . Flagyl  (Metronidazole) Other (See Comments)    HALLUCINATIONS  . Hydrocodone Other (See Comments)    HALLUCINATIONS  . Meperidine Hcl Nausea And Vomiting  . Penicillins Hives  . Sulfonamide Derivatives Hives    No prescriptions prior to admission    No results found for this or any previous visit (from the past 48 hour(s)). No results found.  Review of Systems  Constitutional: Negative.  Negative for fever and chills.  HENT: Negative.   Eyes: Negative.   Respiratory: Negative.   Cardiovascular: Negative.   Gastrointestinal: Negative.  Negative for nausea and vomiting.  Genitourinary: Positive for frequency and flank pain.       Mild stent colic as expected  Musculoskeletal: Negative.   Skin: Negative.   Neurological: Negative.   Endo/Heme/Allergies: Negative.   Psychiatric/Behavioral: Negative.     Height 5\' 2"  (1.575 m), weight 90.719 kg (200 lb). Physical Exam  Constitutional: She is oriented to person, place, and time. She appears well-developed and well-nourished.  HENT:  Head: Normocephalic and atraumatic.  Eyes: EOM are normal. Pupils are equal, round, and reactive to light.  Neck: Normal range of motion. Neck supple.  Cardiovascular: Normal rate and regular rhythm.   Respiratory: Effort normal and breath sounds normal.  GI: Soft. Bowel sounds are normal.       Moderate obesity  Genitourinary:       Minimal left CVAT  Musculoskeletal: Normal range of motion.  Neurological: She is alert and oriented to person, place, and time.       Mild dementia, but AOx3  Skin: Skin is warm and dry.  Psychiatric: She has a normal mood and affect. Her behavior is normal. Judgment and thought content normal.     Assessment/Plan  1 - Left Flank Pain / Renal Stones - We re-discussed ureteroscopic stone manipulation with basketing and laser-lithotripsy in detail.  We discussed risks including bleeding, infection, damage to kidney / ureter  bladder, rarely loss of kidney. We discussed  anesthetic risks and rare but serious surgical complications including DVT, PE, MI, and mortality. We specifically addressed that in 5-10% of cases a staged approach is required with stenting followed by re-attempt ureteroscopy if anatomy unfavorable. The patient voiced understanding and wises to proceed.   Marlette Curvin 07/11/2012, 6:06 AM

## 2012-07-11 NOTE — Brief Op Note (Signed)
07/11/2012  8:15 AM  PATIENT:  Glyn Ade  79 y.o. female  PRE-OPERATIVE DIAGNOSIS:  RESIDUAL LEFT RENAL STONE  POST-OPERATIVE DIAGNOSIS:  RESIDUAL LEFT RENAL STONE  PROCEDURE:  Procedure(s) (LRB) with comments: CYSTOSCOPY WITH STENT REMOVAL (Left) CYSTOSCOPY WITH RETROGRADE PYELOGRAM (Left) - left third stage ureteroscopystone manipulation URETEROSCOPY (Left)  SURGEON:  Surgeon(s) and Role:    * Sebastian Ache, MD - Primary  PHYSICIAN ASSISTANT:   ASSISTANTS: none   ANESTHESIA:   general  EBL:  Total I/O In: 200 [I.V.:200] Out: -   BLOOD ADMINISTERED:none  DRAINS: none   LOCAL MEDICATIONS USED:  NONE  SPECIMEN:  Source of Specimen:  Left renal pelvis, Left Ureter  DISPOSITION OF SPECIMEN:  Stone fragments - Given to Family  COUNTS:  YES  TOURNIQUET:  * No tourniquets in log *  DICTATION: .Other Dictation: Dictation Number [id number]  PLAN OF CARE: Discharge to home after PACU  PATIENT DISPOSITION:  PACU - hemodynamically stable.   Delay start of Pharmacological VTE agent (>24hrs) due to surgical blood loss or risk of bleeding: no

## 2012-07-11 NOTE — Anesthesia Procedure Notes (Signed)
Procedure Name: LMA Insertion Date/Time: 07/11/2012 7:34 AM Performed by: Norva Pavlov Pre-anesthesia Checklist: Patient identified, Emergency Drugs available, Suction available and Patient being monitored Patient Re-evaluated:Patient Re-evaluated prior to inductionOxygen Delivery Method: Circle System Utilized Preoxygenation: Pre-oxygenation with 100% oxygen Intubation Type: IV induction Ventilation: Mask ventilation without difficulty LMA: LMA inserted LMA Size: 4.0 Number of attempts: 1 Airway Equipment and Method: bite block Placement Confirmation: positive ETCO2 Tube secured with: Tape Dental Injury: Teeth and Oropharynx as per pre-operative assessment

## 2012-08-21 ENCOUNTER — Other Ambulatory Visit (HOSPITAL_COMMUNITY): Payer: Self-pay | Admitting: Family Medicine

## 2012-08-21 DIAGNOSIS — R222 Localized swelling, mass and lump, trunk: Secondary | ICD-10-CM

## 2012-08-22 ENCOUNTER — Ambulatory Visit (HOSPITAL_COMMUNITY)
Admission: RE | Admit: 2012-08-22 | Discharge: 2012-08-22 | Disposition: A | Discharge status: Home / Self Care | Payer: Medicare Other | Source: Ambulatory Visit | Attending: Family Medicine | Admitting: Family Medicine

## 2012-08-22 DIAGNOSIS — I517 Cardiomegaly: Secondary | ICD-10-CM | POA: Insufficient documentation

## 2012-08-22 DIAGNOSIS — I2510 Atherosclerotic heart disease of native coronary artery without angina pectoris: Secondary | ICD-10-CM | POA: Insufficient documentation

## 2012-08-22 DIAGNOSIS — R599 Enlarged lymph nodes, unspecified: Secondary | ICD-10-CM | POA: Insufficient documentation

## 2012-08-22 DIAGNOSIS — R911 Solitary pulmonary nodule: Secondary | ICD-10-CM | POA: Insufficient documentation

## 2012-08-22 DIAGNOSIS — K7689 Other specified diseases of liver: Secondary | ICD-10-CM | POA: Insufficient documentation

## 2012-08-22 DIAGNOSIS — K449 Diaphragmatic hernia without obstruction or gangrene: Secondary | ICD-10-CM | POA: Insufficient documentation

## 2012-08-22 DIAGNOSIS — R222 Localized swelling, mass and lump, trunk: Secondary | ICD-10-CM

## 2012-10-06 ENCOUNTER — Ambulatory Visit: Payer: Medicare Other | Attending: Endocrinology | Admitting: Physical Therapy

## 2012-10-06 DIAGNOSIS — IMO0001 Reserved for inherently not codable concepts without codable children: Secondary | ICD-10-CM | POA: Insufficient documentation

## 2012-10-06 DIAGNOSIS — R5381 Other malaise: Secondary | ICD-10-CM | POA: Insufficient documentation

## 2012-10-13 ENCOUNTER — Ambulatory Visit: Payer: Medicare Other | Attending: Endocrinology | Admitting: Physical Therapy

## 2012-10-13 DIAGNOSIS — IMO0001 Reserved for inherently not codable concepts without codable children: Secondary | ICD-10-CM | POA: Insufficient documentation

## 2012-10-13 DIAGNOSIS — Z96659 Presence of unspecified artificial knee joint: Secondary | ICD-10-CM | POA: Insufficient documentation

## 2012-10-13 DIAGNOSIS — R29890 Loss of height: Secondary | ICD-10-CM | POA: Insufficient documentation

## 2012-10-13 DIAGNOSIS — R5381 Other malaise: Secondary | ICD-10-CM | POA: Insufficient documentation

## 2012-10-20 ENCOUNTER — Ambulatory Visit: Payer: Medicare Other | Admitting: Physical Therapy

## 2012-10-27 ENCOUNTER — Ambulatory Visit: Payer: Medicare Other | Admitting: Physical Therapy

## 2012-11-19 ENCOUNTER — Other Ambulatory Visit: Payer: Self-pay | Admitting: Family Medicine

## 2012-11-19 DIAGNOSIS — Z1231 Encounter for screening mammogram for malignant neoplasm of breast: Secondary | ICD-10-CM

## 2012-11-24 ENCOUNTER — Ambulatory Visit: Payer: Medicare Other

## 2013-02-13 ENCOUNTER — Other Ambulatory Visit: Payer: Self-pay | Admitting: *Deleted

## 2013-02-13 DIAGNOSIS — E039 Hypothyroidism, unspecified: Secondary | ICD-10-CM | POA: Insufficient documentation

## 2013-02-17 ENCOUNTER — Other Ambulatory Visit (INDEPENDENT_AMBULATORY_CARE_PROVIDER_SITE_OTHER): Payer: Medicare Other

## 2013-02-17 DIAGNOSIS — E039 Hypothyroidism, unspecified: Secondary | ICD-10-CM

## 2013-02-19 ENCOUNTER — Ambulatory Visit (INDEPENDENT_AMBULATORY_CARE_PROVIDER_SITE_OTHER): Payer: Medicare Other | Admitting: Endocrinology

## 2013-02-19 ENCOUNTER — Telehealth: Payer: Self-pay | Admitting: Endocrinology

## 2013-02-19 VITALS — BP 120/64 | HR 68 | Temp 98.2°F | Ht 61.0 in | Wt 214.3 lb

## 2013-02-19 DIAGNOSIS — M899 Disorder of bone, unspecified: Secondary | ICD-10-CM

## 2013-02-19 DIAGNOSIS — M8580 Other specified disorders of bone density and structure, unspecified site: Secondary | ICD-10-CM | POA: Insufficient documentation

## 2013-02-19 DIAGNOSIS — E559 Vitamin D deficiency, unspecified: Secondary | ICD-10-CM

## 2013-02-19 MED ORDER — CHOLECALCIFEROL 1.25 MG (50000 UT) PO CAPS: 50000.0000 [IU] | ORAL_CAPSULE | Freq: Every day | ORAL | Status: DC

## 2013-02-19 NOTE — Patient Instructions (Addendum)
Take Vitamin D weekly regularly same day of the week Get high calcium food: dairy products and fortified OJ

## 2013-02-19 NOTE — Progress Notes (Signed)
Patient ID: Monica Strong, female   DOB: 1932-01-20, 79 y.o.   MRN: [id number]  Chief complaint: Followup  History of Present Illness:  She was found to have vitamin D level of 19.7 in 5/14 was she was being evaluated for possible hyperparathyroidism. She had a normal calcium and minimal increase in parathyroid hormone was felt to be secondary to vitamin D deficiency and subsequent PTH in 5/14 was normal along with normal calcium levels  She has had a history of recurrent urolithiasis. She had been given 50,000 units vitamin D weekly in 4/14 but has been irregular with taking the supplement given on her last visit She has not started back on the weekly supplement about 6 weeks ago but is still irregular with this She admits to be forgetful  She was also evaluated for osteoporosis and was found to have T score of -2.0, no history of fractures but probably a history of 1-1.5 inches height loss She does not have any complaints today     Medication List       This list is accurate as of: 02/19/13 10:48 AM.  Always use your most recent med list.               acetaminophen 325 MG tablet  Commonly known as:  TYLENOL  Take 650 mg by mouth every 6 (six) hours as needed.     multivitamin tablet  Take 1 tablet by mouth daily.     omeprazole 20 MG capsule  Commonly known as:  PRILOSEC  Take 20 mg by mouth daily.     OVER THE COUNTER MEDICATION  TAKES SILVER SOLN. LIQUID FOR SINUS DRAINAGE ONE TSP PRN DAILY     sennosides-docusate sodium 8.6-50 MG tablet  Commonly known as:  SENOKOT-S  Take 1 tablet by mouth 2 (two) times daily. While taking pain meds to prevent constipation     traMADol 50 MG tablet  Commonly known as:  ULTRAM  Take 1 tablet (50 mg total) by mouth every 6 (six) hours as needed for pain.        Allergies:  Allergies  Allergen Reactions  . Diphenhydramine Other (See Comments)    UNKNOWN  . Flagyl [Metronidazole] Other (See Comments)    HALLUCINATIONS  .  Hydrocodone Other (See Comments)    HALLUCINATIONS  . Meperidine Hcl Nausea And Vomiting  . Penicillins Hives  . Sulfonamide Derivatives Hives    Past Medical History  Diagnosis Date  . Renal stones left  . GERD (gastroesophageal reflux disease)   . H/O hiatal hernia   . Pulmonary nodules BENIGN--  MONITORED BY PCP DR READE--  ASYMPTOMATIC     LAST CHEST CT 08-17-2011  . Heart murmur MILD -- ASYMPTOMATIC  . Urge urinary incontinence   . Sinus drainage   . Neuromuscular disorder     rt hand numbness  . Dysuria     has urethral bump  . Ureteral stent retained   . Arthritis   . OSA on CPAP   . OSA (obstructive sleep apnea)     not using cpap    Past Surgical History  Procedure Laterality Date  . Total knee arthroplasty  10-17-2009  DR ALUISIO    LEFT  . Right total knee arthroplasty  04-25-2009  . Tonsillectomy    . Cardiovascular stress test  03-13-2007  dr Gloris [place] turner    NORMAL LV SIZE SYSTOLIC FUCTION/ NO ISCHEMIA/ EF 85%  . Transthoracic echocardiogram  08-18-2008    NORMAL  LVSF/ EF 55-60%/ MILD MITRAL REGURG .  Marland Kitchen Removal breast cyst, benign  1960'S  . Vaginal hysterectomy  1970  . Cataract extraction w/ intraocular lens  implant, bilateral    . Blepharoplasty      BILATERAL  . Cystoscopy with retrograde pyelogram, ureteroscopy and stent placement  05/14/2012    Procedure: CYSTOSCOPY WITH RETROGRADE PYELOGRAM, URETEROSCOPY AND STENT PLACEMENT;  Surgeon: Sebastian Ache, MD;  Location: [facility];  Service: Urology;  Laterality: Left;  1ST STAGE LEFT URETEROSCOPY, LEFT RETROGRADE, DIGITAL URETEROSCOPY,  LEFT STONE REMOVAL WITH ESCAPE BASKET,  STENT PLACEMENt   . Holmium laser application  05/14/2012    Procedure: HOLMIUM LASER APPLICATION;  Surgeon: Sebastian Ache, MD;  Location: [facility];  Service: Urology;  Laterality: Left;  . Holmium laser application  06/13/2012    Procedure: HOLMIUM LASER APPLICATION;  Surgeon: Sebastian Ache, MD;   Location: [facility];  Service: Urology;  Laterality: Left;  . Cystoscopy/retrograde/ureteroscopy/stone extraction with basket  06/13/2012    Procedure: CYSTOSCOPY/RETROGRADE/URETEROSCOPY/STONE EXTRACTION WITH BASKET;  Surgeon: Sebastian Ache, MD;  Location: [facility];  Service: Urology;  Laterality: Left;  . Cystoscopy w/ ureteral stent placement  06/13/2012    Procedure: CYSTOSCOPY WITH STENT REPLACEMENT;  Surgeon: Sebastian Ache, MD;  Location: [facility];  Service: Urology;  Laterality: Left;  . Appendectomy      PT STATES PER XRAY SMALL AMOUNT OF APPENDIX LEFT  . Cystoscopy w/ ureteral stent removal  07/11/2012    Procedure: CYSTOSCOPY WITH STENT REMOVAL;  Surgeon: Sebastian Ache, MD;  Location: [facility];  Service: Urology;  Laterality: Left;  . Cystoscopy w/ retrogrades  07/11/2012    Procedure: CYSTOSCOPY WITH RETROGRADE PYELOGRAM;  Surgeon: Sebastian Ache, MD;  Location: [facility];  Service: Urology;  Laterality: Left;  left third stage ureteroscopystone manipulation  . Ureteroscopy  07/11/2012    Procedure: URETEROSCOPY;  Surgeon: Sebastian Ache, MD;  Location: [facility];  Service: Urology;  Laterality: Left;    No family history on file.  Social History:  reports that she has never smoked. She has never used smokeless tobacco. She reports that she does not drink alcohol or use illicit drugs.  Review of Systems   She has history of osteoarthritis of knees Complains of decreased memory especially short-term She has had difficulty with balance which is better with physical therapy  Labs:  Appointment on 02/17/2013  Component Date Value Range Status  . TSH 02/17/2013 1.03  0.35 - 5.50 uIU/mL Final  . Free T4 02/17/2013 0.77  0.60 - 1.60 ng/dL Final    EXAM:  BP 045/40  Pulse 68  Temp(Src) 98.2 F (36.8 C) (Oral)  Ht 5\' 1"  (1.549 m)  Wt 214 lb 4.8 oz (97.206 kg)  BMI 40.51 kg/m2   SpO2 96%  Assessment/Plan:   VITAMIN D deficiency and osteopenia of:  History vitamin D deficiency with osteopenia and normal calcium She has been irregular with her vitamin D supplement and discussed getting a pill box to take this on a weekly basis She will have a vitamin D level checked on her next visit since she has not taken it regularly  [facility] 02/19/2013, 10:48 AM

## 2013-02-19 NOTE — Telephone Encounter (Signed)
Question about script just e-scribed. Please call [facility]# [phone] for pharmacist

## 2013-02-19 NOTE — Telephone Encounter (Signed)
Returned call to pharmacy.

## 2013-03-17 ENCOUNTER — Other Ambulatory Visit: Payer: Self-pay | Admitting: *Deleted

## 2013-03-17 MED ORDER — CHOLECALCIFEROL 1.25 MG (50000 UT) PO CAPS: 50000.0000 [IU] | ORAL_CAPSULE | Freq: Every day | ORAL | Status: DC

## 2013-03-17 MED ORDER — VITAMIN D (ERGOCALCIFEROL) 1.25 MG (50000 UNIT) PO CAPS: 50000.0000 [IU] | ORAL_CAPSULE | ORAL | Status: DC

## 2013-05-12 ENCOUNTER — Encounter: Payer: Self-pay | Admitting: Internal Medicine

## 2013-05-18 ENCOUNTER — Other Ambulatory Visit (INDEPENDENT_AMBULATORY_CARE_PROVIDER_SITE_OTHER): Payer: Medicare Other

## 2013-05-18 DIAGNOSIS — E559 Vitamin D deficiency, unspecified: Secondary | ICD-10-CM

## 2013-05-18 DIAGNOSIS — M8580 Other specified disorders of bone density and structure, unspecified site: Secondary | ICD-10-CM

## 2013-05-18 LAB — BASIC METABOLIC PANEL
CO2: 25 mEq/L (ref 19–32)
Calcium: 9.5 mg/dL (ref 8.4–10.5)
Chloride: 109 mEq/L (ref 96–112)
Glucose, Bld: 101 mg/dL — ABNORMAL HIGH (ref 70–99)
Potassium: 4.1 mEq/L (ref 3.5–5.1)
Sodium: 140 mEq/L (ref 135–145)

## 2013-05-21 ENCOUNTER — Encounter: Payer: Self-pay | Admitting: Endocrinology

## 2013-05-21 ENCOUNTER — Ambulatory Visit (INDEPENDENT_AMBULATORY_CARE_PROVIDER_SITE_OTHER): Payer: Medicare Other | Admitting: Endocrinology

## 2013-05-21 VITALS — BP 132/68 | HR 72 | Temp 98.6°F | Resp 12 | Ht 60.0 in | Wt 220.1 lb

## 2013-05-21 DIAGNOSIS — M8580 Other specified disorders of bone density and structure, unspecified site: Secondary | ICD-10-CM

## 2013-05-21 DIAGNOSIS — E559 Vitamin D deficiency, unspecified: Secondary | ICD-10-CM

## 2013-05-21 DIAGNOSIS — M899 Disorder of bone, unspecified: Secondary | ICD-10-CM

## 2013-05-21 NOTE — Patient Instructions (Signed)
Stay on Vitamin D every Sunday

## 2013-05-21 NOTE — Progress Notes (Signed)
Patient ID: Monica Strong, female   DOB: 12/22/1931, 79 y.o.   MRN: [id number]  Chief complaint: Followup  History of Present Illness:  She was found to have a low vitamin D level of 19.7 in 5/14 when she  was being evaluated for possible hyperparathyroidism.  She had a normal calcium and minimal increase in parathyroid hormone was felt to be secondary to vitamin D deficiency. Her subsequent PTH in 5/14 was normal along with normal calcium levels  She had been given 50,000 units vitamin D weekly in 4/14  On her last visit she was forgetting to take her supplement. She was asked to take it on Sundays and use a pillbox to help remember this She has been regular with her vitamin D supplement now Her only symptoms now are periodic vertigo  She was also evaluated for osteoporosis and was found to have T score of -2.0, has no history of fractures but probably a history of 1-1.5 inches height loss  She has had a history of recurrent urolithiasis.    Medication List       This list is accurate as of: 05/21/13 11:07 AM.  Always use your most recent med list.               acetaminophen 325 MG tablet  Commonly known as:  TYLENOL  Take 650 mg by mouth every 6 (six) hours as needed.     multivitamin tablet  Take 1 tablet by mouth daily.     omeprazole 20 MG capsule  Commonly known as:  PRILOSEC  Take 20 mg by mouth daily.     OVER THE COUNTER MEDICATION  TAKES SILVER SOLN. LIQUID FOR SINUS DRAINAGE ONE TSP PRN DAILY     sennosides-docusate sodium 8.6-50 MG tablet  Commonly known as:  SENOKOT-S  Take 1 tablet by mouth 2 (two) times daily. While taking pain meds to prevent constipation     traMADol 50 MG tablet  Commonly known as:  ULTRAM  Take 1 tablet (50 mg total) by mouth every 6 (six) hours as needed for pain.     Vitamin D (Ergocalciferol) 50000 UNITS Caps capsule  Commonly known as:  DRISDOL  Take 1 capsule (50,000 Units total) by mouth every 7 (seven) days.         Allergies:  Allergies  Allergen Reactions  . Diphenhydramine Other (See Comments)    UNKNOWN  . Flagyl [Metronidazole] Other (See Comments)    HALLUCINATIONS  . Hydrocodone Other (See Comments)    HALLUCINATIONS  . Meperidine Hcl Nausea And Vomiting  . Penicillins Hives  . Sulfonamide Derivatives Hives    Past Medical History  Diagnosis Date  . Renal stones left  . GERD (gastroesophageal reflux disease)   . H/O hiatal hernia   . Pulmonary nodules BENIGN--  MONITORED BY PCP DR READE--  ASYMPTOMATIC     LAST CHEST CT 08-17-2011  . Heart murmur MILD -- ASYMPTOMATIC  . Urge urinary incontinence   . Sinus drainage   . Neuromuscular disorder     rt hand numbness  . Dysuria     has urethral bump  . Ureteral stent retained   . Arthritis   . OSA on CPAP   . OSA (obstructive sleep apnea)     not using cpap    Past Surgical History  Procedure Laterality Date  . Total knee arthroplasty  10-17-2009  DR ALUISIO    LEFT  . Right total knee arthroplasty  04-25-2009  .  Tonsillectomy    . Cardiovascular stress test  03-13-2007  dr Gloris [place] turner    NORMAL LV SIZE SYSTOLIC FUCTION/ NO ISCHEMIA/ EF 85%  . Transthoracic echocardiogram  08-18-2008    NORMAL LVSF/ EF 55-60%/ MILD MITRAL REGURG .  Marland Kitchen Removal breast cyst, benign  1960'S  . Vaginal hysterectomy  1970  . Cataract extraction w/ intraocular lens  implant, bilateral    . Blepharoplasty      BILATERAL  . Cystoscopy with retrograde pyelogram, ureteroscopy and stent placement  05/14/2012    Procedure: CYSTOSCOPY WITH RETROGRADE PYELOGRAM, URETEROSCOPY AND STENT PLACEMENT;  Surgeon: Sebastian Ache, MD;  Location: [facility];  Service: Urology;  Laterality: Left;  1ST STAGE LEFT URETEROSCOPY, LEFT RETROGRADE, DIGITAL URETEROSCOPY,  LEFT STONE REMOVAL WITH ESCAPE BASKET,  STENT PLACEMENt   . Holmium laser application  05/14/2012    Procedure: HOLMIUM LASER APPLICATION;  Surgeon: Sebastian Ache, MD;  Location: [facility];  Service: Urology;  Laterality: Left;  . Holmium laser application  06/13/2012    Procedure: HOLMIUM LASER APPLICATION;  Surgeon: Sebastian Ache, MD;  Location: [facility];  Service: Urology;  Laterality: Left;  . Cystoscopy/retrograde/ureteroscopy/stone extraction with basket  06/13/2012    Procedure: CYSTOSCOPY/RETROGRADE/URETEROSCOPY/STONE EXTRACTION WITH BASKET;  Surgeon: Sebastian Ache, MD;  Location: [facility];  Service: Urology;  Laterality: Left;  . Cystoscopy w/ ureteral stent placement  06/13/2012    Procedure: CYSTOSCOPY WITH STENT REPLACEMENT;  Surgeon: Sebastian Ache, MD;  Location: [facility];  Service: Urology;  Laterality: Left;  . Appendectomy      PT STATES PER XRAY SMALL AMOUNT OF APPENDIX LEFT  . Cystoscopy w/ ureteral stent removal  07/11/2012    Procedure: CYSTOSCOPY WITH STENT REMOVAL;  Surgeon: Sebastian Ache, MD;  Location: [facility];  Service: Urology;  Laterality: Left;  . Cystoscopy w/ retrogrades  07/11/2012    Procedure: CYSTOSCOPY WITH RETROGRADE PYELOGRAM;  Surgeon: Sebastian Ache, MD;  Location: [facility];  Service: Urology;  Laterality: Left;  left third stage ureteroscopystone manipulation  . Ureteroscopy  07/11/2012    Procedure: URETEROSCOPY;  Surgeon: Sebastian Ache, MD;  Location: [facility];  Service: Urology;  Laterality: Left;    No family history on file.  Social History:  reports that she has never smoked. She has never used smokeless tobacco. She reports that she does not drink alcohol or use illicit drugs.  Review of Systems   She has history of osteoarthritis of knees Has had decreased memory especially short-term She has had difficulty with balance  No history of thyroid disease, this has been evaluated previously No history of diabetes  Labs:  Appointment on 05/18/2013  Component Date Value Range Status  . Sodium 05/18/2013  140  135 - 145 mEq/L Final  . Potassium 05/18/2013 4.1  3.5 - 5.1 mEq/L Final  . Chloride 05/18/2013 109  96 - 112 mEq/L Final  . CO2 05/18/2013 25  19 - 32 mEq/L Final  . Glucose, Bld 05/18/2013 101* 70 - 99 mg/dL Final  . BUN 16/03/9603 19  6 - 23 mg/dL Final  . Creatinine, Ser 05/18/2013 0.6  0.4 - 1.2 mg/dL Final  . Calcium 54/02/8118 9.5  8.4 - 10.5 mg/dL Final  . GFR 14/78/2956 94.92  >60.00 mL/min Final  . Vit D, 25-Hydroxy 05/18/2013 32  30 - 89 ng/mL Final   Comment: This assay accurately quantifies Vitamin D, which is the sum of the  25-Hydroxy forms of Vitamin D2 and D3.  Studies have shown that the                          optimum concentration of 25-Hydroxy Vitamin D is 30 ng/mL or higher.                           Concentrations of Vitamin D between 20 and 29 ng/mL are considered to                          be insufficient and concentrations less than 20 ng/mL are considered                          to be deficient for Vitamin D.    EXAM:  BP 132/68  Pulse 72  Temp(Src) 98.6 F (37 C)  Resp 12  Ht 5' (1.524 m)  Wt 220 lb 1.6 oz (99.837 kg)  BMI 42.99 kg/m2  SpO2 95%  Assessment/Plan:   VITAMIN D deficiency and osteopenia    Her vitamin D level is normal now compared to baseline Compliance with her vitamin D weekly is improved now with structuring the dosage every Sunday Does not have any other active endocrine problems.  She will have a vitamin D level checked on her periodic wellness physicals with her primary care physician and come back here as needed  [facility] 05/21/2013, 11:07 AM

## 2013-06-19 DIAGNOSIS — R413 Other amnesia: Secondary | ICD-10-CM

## 2013-06-25 DIAGNOSIS — R413 Other amnesia: Secondary | ICD-10-CM

## 2013-10-19 ENCOUNTER — Encounter: Payer: Self-pay | Admitting: Cardiology

## 2013-10-22 ENCOUNTER — Encounter: Payer: Self-pay | Admitting: General Surgery

## 2013-11-17 ENCOUNTER — Encounter: Payer: Self-pay | Admitting: Internal Medicine

## 2014-02-16 ENCOUNTER — Encounter (HOSPITAL_COMMUNITY): Payer: Self-pay | Admitting: Emergency Medicine

## 2014-02-16 ENCOUNTER — Emergency Department (HOSPITAL_COMMUNITY): Payer: Medicare Other

## 2014-02-16 ENCOUNTER — Inpatient Hospital Stay (HOSPITAL_COMMUNITY)
Admission: EM | Admit: 2014-02-16 | Discharge: 2014-02-23 | DRG: 885 | Disposition: A | Discharge status: Skilled Nursing Facility | Payer: Medicare Other | Attending: Internal Medicine | Admitting: Internal Medicine

## 2014-02-16 DIAGNOSIS — I4819 Other persistent atrial fibrillation: Secondary | ICD-10-CM

## 2014-02-16 DIAGNOSIS — R778 Other specified abnormalities of plasma proteins: Secondary | ICD-10-CM | POA: Diagnosis present

## 2014-02-16 DIAGNOSIS — Z79899 Other long term (current) drug therapy: Secondary | ICD-10-CM

## 2014-02-16 DIAGNOSIS — Z8619 Personal history of other infectious and parasitic diseases: Secondary | ICD-10-CM

## 2014-02-16 DIAGNOSIS — Z961 Presence of intraocular lens: Secondary | ICD-10-CM

## 2014-02-16 DIAGNOSIS — R443 Hallucinations, unspecified: Secondary | ICD-10-CM

## 2014-02-16 DIAGNOSIS — I10 Essential (primary) hypertension: Secondary | ICD-10-CM

## 2014-02-16 DIAGNOSIS — Z96659 Presence of unspecified artificial knee joint: Secondary | ICD-10-CM

## 2014-02-16 DIAGNOSIS — I248 Other forms of acute ischemic heart disease: Secondary | ICD-10-CM

## 2014-02-16 DIAGNOSIS — R410 Disorientation, unspecified: Secondary | ICD-10-CM

## 2014-02-16 DIAGNOSIS — M129 Arthropathy, unspecified: Secondary | ICD-10-CM | POA: Diagnosis present

## 2014-02-16 DIAGNOSIS — F29 Unspecified psychosis not due to a substance or known physiological condition: Secondary | ICD-10-CM | POA: Diagnosis present

## 2014-02-16 DIAGNOSIS — Z9849 Cataract extraction status, unspecified eye: Secondary | ICD-10-CM

## 2014-02-16 DIAGNOSIS — K219 Gastro-esophageal reflux disease without esophagitis: Secondary | ICD-10-CM | POA: Diagnosis present

## 2014-02-16 DIAGNOSIS — F22 Delusional disorders: Principal | ICD-10-CM

## 2014-02-16 DIAGNOSIS — N390 Urinary tract infection, site not specified: Secondary | ICD-10-CM | POA: Diagnosis present

## 2014-02-16 DIAGNOSIS — Z6838 Body mass index (BMI) 38.0-38.9, adult: Secondary | ICD-10-CM

## 2014-02-16 DIAGNOSIS — I2489 Other forms of acute ischemic heart disease: Secondary | ICD-10-CM

## 2014-02-16 DIAGNOSIS — I4891 Unspecified atrial fibrillation: Secondary | ICD-10-CM

## 2014-02-16 DIAGNOSIS — R7989 Other specified abnormal findings of blood chemistry: Secondary | ICD-10-CM

## 2014-02-16 DIAGNOSIS — A498 Other bacterial infections of unspecified site: Secondary | ICD-10-CM | POA: Diagnosis present

## 2014-02-16 DIAGNOSIS — G4733 Obstructive sleep apnea (adult) (pediatric): Secondary | ICD-10-CM

## 2014-02-16 DIAGNOSIS — R4182 Altered mental status, unspecified: Secondary | ICD-10-CM | POA: Diagnosis present

## 2014-02-16 HISTORY — DX: Unspecified disease of inner ear, unspecified ear: H83.90

## 2014-02-16 HISTORY — DX: Obesity, unspecified: E66.9

## 2014-02-16 HISTORY — DX: Personal history of other infectious and parasitic diseases: Z86.19

## 2014-02-16 HISTORY — DX: Chest pain, unspecified: R07.9

## 2014-02-16 HISTORY — DX: Delusional disorders: F22

## 2014-02-16 LAB — URINALYSIS, ROUTINE W REFLEX MICROSCOPIC
Glucose, UA: NEGATIVE mg/dL
Ketones, ur: NEGATIVE mg/dL
Nitrite: NEGATIVE
PH: 5 (ref 5.0–8.0)
Protein, ur: 100 mg/dL — AB
SPECIFIC GRAVITY, URINE: 1.027 (ref 1.005–1.030)
UROBILINOGEN UA: 0.2 mg/dL (ref 0.0–1.0)

## 2014-02-16 LAB — COMPREHENSIVE METABOLIC PANEL
ALBUMIN: 3.9 g/dL (ref 3.5–5.2)
ALT: 17 U/L (ref 0–35)
AST: 25 U/L (ref 0–37)
Alkaline Phosphatase: 74 U/L (ref 39–117)
Anion gap: 14 (ref 5–15)
BUN: 25 mg/dL — ABNORMAL HIGH (ref 6–23)
CALCIUM: 9.8 mg/dL (ref 8.4–10.5)
CO2: 24 mEq/L (ref 19–32)
Chloride: 108 mEq/L (ref 96–112)
Creatinine, Ser: 0.9 mg/dL (ref 0.50–1.10)
GFR calc non Af Amer: 59 mL/min — ABNORMAL LOW (ref >90)
GFR, EST AFRICAN AMERICAN: 68 mL/min — AB (ref >90)
GLUCOSE: 116 mg/dL — AB (ref 70–99)
Potassium: 3.8 mEq/L (ref 3.7–5.3)
SODIUM: 146 meq/L (ref 137–147)
TOTAL PROTEIN: 7 g/dL (ref 6.0–8.3)
Total Bilirubin: 0.6 mg/dL (ref 0.3–1.2)

## 2014-02-16 LAB — CBC
HCT: 44.3 % (ref 36.0–46.0)
Hemoglobin: 14.5 g/dL (ref 12.0–15.0)
MCH: 31.2 pg (ref 26.0–34.0)
MCHC: 32.7 g/dL (ref 30.0–36.0)
MCV: 95.3 fL (ref 78.0–100.0)
PLATELETS: 203 10*3/uL (ref 150–400)
RBC: 4.65 MIL/uL (ref 3.87–5.11)
RDW: 13.5 % (ref 11.5–15.5)
WBC: 5.9 10*3/uL (ref 4.0–10.5)

## 2014-02-16 LAB — RAPID URINE DRUG SCREEN, HOSP PERFORMED
AMPHETAMINES: NOT DETECTED
Barbiturates: NOT DETECTED
Benzodiazepines: NOT DETECTED
Cocaine: NOT DETECTED
OPIATES: NOT DETECTED
Tetrahydrocannabinol: NOT DETECTED

## 2014-02-16 LAB — URINE MICROSCOPIC-ADD ON

## 2014-02-16 LAB — AMMONIA: AMMONIA: 31 umol/L (ref 11–60)

## 2014-02-16 LAB — CBG MONITORING, ED: GLUCOSE-CAPILLARY: 108 mg/dL — AB (ref 70–99)

## 2014-02-16 MED ORDER — DILTIAZEM HCL 25 MG/5ML IV SOLN
10.0000 mg | Freq: Once | INTRAVENOUS | Status: AC
  Administered 2014-02-17: 10 mg via INTRAVENOUS
  Filled 2014-02-16: qty 5

## 2014-02-16 NOTE — ED Notes (Signed)
Per EMS-[employer] contacted EMS and patient was "barricaded behind the door at her home." Per family, patient was acting bizarre and feeling that people were wanting to hurt her. Pt claiming "people are wanting to do horrible things to me." No hx dementia. Had nervous breakdown as teenager. Answered questions appropriately. A&Ox4. Moving all extremities. BP 170/98 HR 102 CBG 110 RR 20. En route patient became "sleepy" and now is not answering questions. [employer] at bedside.

## 2014-02-16 NOTE — ED Provider Notes (Addendum)
CSN: [id number]     Arrival date & time 02/16/14  2006 History   First MD Initiated Contact with Patient 02/16/14 2029     Chief Complaint  Patient presents with  . Altered Mental Status     (Consider location/radiation/quality/duration/timing/severity/associated sxs/prior Treatment) HPI Comments: Patient brought to the ER by EMS for possible psychiatric evaluation. Patient is accompanied by her daughter who provides information, patient will not answer any questions or provide any additional information.  Daughter reports that she is primary caregiver for the patient. Patient lives alone, but daughter called her for her [employer] on her daily. Daughter went away for vacation and the patient reportedly was found wandering. This occurred 3 days ago. She lives independently and still drives. The patient reportedly drove to the movie theater and became acutely confused. She was found in a random persons car outside the movie theater. Police were called and brought the patient to her home. Daughter came home from vacation has been monitoring the patient. The patient has had increased confusion, agitation, paranoia and visual hallucinations. Today the patient barricaded herself in the home and the daughter could not get in. It took the police one hour to get into the home, patient had been lying on the floor in front of the daughter not allowing the bedside. Once again access, she told him that people are trying to harm her. This is consistent with paranoia she has been exhibiting recently. Daughter reports the patient has a long history of dental disorder, unclear what the diagnosis is. She has been hospitalized in the past. Daughter reports that over the last 4 years or so her symptoms have worsened. This was attributed to her advancing each. She was put on medication for her "paranoia and confusion". Daughter is not sure what that medication is.   Level V Caveat due to psychiatric disorder.   Patient is a  80 y.o. female presenting with altered mental status.  Altered Mental Status   Past Medical History  Diagnosis Date  . Renal stones left  . GERD (gastroesophageal reflux disease)   . H/O hiatal hernia   . Pulmonary nodules BENIGN--  MONITORED BY PCP DR READE--  ASYMPTOMATIC     LAST CHEST CT 08-17-2011  . Heart murmur MILD -- ASYMPTOMATIC  . Urge urinary incontinence   . Sinus drainage   . Neuromuscular disorder     rt hand numbness  . Dysuria     has urethral bump  . Ureteral stent retained   . Arthritis   . OSA on CPAP   . OSA (obstructive sleep apnea)     not using cpap   Past Surgical History  Procedure Laterality Date  . Total knee arthroplasty  10-17-2009  DR ALUISIO    LEFT  . Right total knee arthroplasty  04-25-2009  . Tonsillectomy    . Cardiovascular stress test  03-13-2007  dr Tressia Miners turner    NORMAL LV SIZE SYSTOLIC FUCTION/ NO ISCHEMIA/ EF 85%  . Transthoracic echocardiogram  08-18-2008    NORMAL LVSF/ EF 55-60%/ MILD MITRAL REGURG .  Marland Kitchen Removal breast cyst, benign  1960'S  . Vaginal hysterectomy  1970  . Cataract extraction w/ intraocular lens  implant, bilateral    . Blepharoplasty      BILATERAL  . Cystoscopy with retrograde pyelogram, ureteroscopy and stent placement  05/14/2012    Procedure: [place], URETEROSCOPY AND STENT PLACEMENT;  Surgeon: Alexis Frock, MD;  Location: [facility];  Service: Urology;  Laterality: Left;  1ST STAGE LEFT URETEROSCOPY, LEFT RETROGRADE, DIGITAL URETEROSCOPY,  LEFT STONE REMOVAL WITH ESCAPE BASKET,  STENT PLACEMENt   . Holmium laser application  60/12/3708    Procedure: HOLMIUM LASER APPLICATION;  Surgeon: Alexis Frock, MD;  Location: [facility];  Service: Urology;  Laterality: Left;  . Holmium laser application  11/11/6946    Procedure: HOLMIUM LASER APPLICATION;  Surgeon: Alexis Frock, MD;  Location: [facility];  Service: Urology;  Laterality:  Left;  . Cystoscopy/retrograde/ureteroscopy/stone extraction with basket  06/13/2012    Procedure: CYSTOSCOPY/RETROGRADE/URETEROSCOPY/STONE EXTRACTION WITH BASKET;  Surgeon: Alexis Frock, MD;  Location[facility];  Service: Urology;  Laterality: Left;  . Cystoscopy w/ ureteral stent placement  06/13/2012    Procedure: CYSTOSCOPY WITH STENT REPLACEMENT;  Surgeon: Alexis Frock, MD;  Location: [facility];  Service: Urology;  Laterality: Left;  . Appendectomy      PT STATES PER XRAY SMALL AMOUNT OF APPENDIX LEFT  . Cystoscopy w/ ureteral stent removal  07/11/2012    Procedure: CYSTOSCOPY WITH STENT REMOVAL;  Surgeon: Alexis Frock, MD;  Location: [facility];  Service: Urology;  Laterality: Left;  . Cystoscopy w/ retrogrades  07/11/2012    Procedure: CYSTOSCOPY WITH RETROGRADE PYELOGRAM;  Surgeon: Alexis Frock, MD;  Location: [facility];  Service: Urology;  Laterality: Left;  left third stage ureteroscopystone manipulation  . Ureteroscopy  07/11/2012    Procedure: URETEROSCOPY;  Surgeon: Alexis Frock, MD;  Location: [facility];  Service: Urology;  Laterality: Left;   History reviewed. No pertinent family history. History  Substance Use Topics  . Smoking status: Never Smoker   . Smokeless tobacco: Never Used  . Alcohol Use: No   OB History   Grav Para Term Preterm Abortions TAB SAB Ect Mult Living                 Review of Systems  Unable to perform ROS: Psychiatric disorder      Allergies  Diphenhydramine; Flagyl; Hydrocodone; Meperidine hcl; Penicillins; and Sulfonamide derivatives  Home Medications   Prior to Admission medications   Medication Sig Start Date End Date Taking? Authorizing Provider  omeprazole (PRILOSEC) 20 MG capsule Take 20 mg by mouth daily.   Yes Historical Provider, MD  QUEtiapine (SEROQUEL) 25 MG tablet Take 25 mg by mouth at bedtime.   Yes Historical Provider, MD   sennosides-docusate sodium (SENOKOT-S) 8.6-50 MG tablet Take 1 tablet by mouth 2 (two) times daily. While taking pain meds to prevent constipation 07/11/12   Alexis Frock, MD   BP 161/87  Pulse 81  Resp 18  SpO2 94% Physical Exam  Constitutional: She appears well-developed and well-nourished. She appears listless. No distress.  HENT:  Head: Normocephalic and atraumatic.  Right Ear: Hearing normal.  Left Ear: Hearing normal.  Nose: Nose normal.  Mouth/Throat: Oropharynx is clear and moist and mucous membranes are normal.  Eyes: Conjunctivae and EOM are normal. Pupils are equal, round, and reactive to light.  Neck: Normal range of motion. Neck supple.  Cardiovascular: Regular rhythm, S1 normal and S2 normal.  Exam reveals no gallop and no friction rub.   No murmur heard. Pulmonary/Chest: Effort normal and breath sounds normal. No respiratory distress. She exhibits no tenderness.  Abdominal: Soft. Normal appearance and bowel sounds are normal. There is no hepatosplenomegaly. There is no tenderness. There is no rebound, no guarding, no tenderness at McBurney's point and negative Murphy's sign. No hernia.  Musculoskeletal: Normal range of motion.  Neurological: She has normal strength. She appears listless. No cranial nerve deficit or sensory deficit. Coordination normal. GCS eye subscore is 3. GCS verbal subscore is 4. GCS motor subscore is 6.  Patient with eyes closed, opened to voice and only responds to a confused manner when asked questions. No focal deficits on examination, moving all extremities equally. No cranial nerve abnormality.  Skin: Skin is warm, dry and intact. No rash noted. No cyanosis.  Psychiatric: She is agitated. Thought content is paranoid. She is noncommunicative.    ED Course  Procedures (including critical care time) Labs Review Labs Reviewed  COMPREHENSIVE METABOLIC PANEL - Abnormal; Notable for the following:    Glucose, Bld 116 (*)    BUN 25 (*)    GFR  calc non Af Amer 59 (*)    GFR calc Af Amer 68 (*)    All other components within normal limits  URINALYSIS, ROUTINE W REFLEX MICROSCOPIC - Abnormal; Notable for the following:    Color, Urine AMBER (*)    APPearance CLOUDY (*)    Hgb urine dipstick SMALL (*)    Bilirubin Urine SMALL (*)    Protein, ur 100 (*)    Leukocytes, UA TRACE (*)    All other components within normal limits  URINE MICROSCOPIC-ADD ON - Abnormal; Notable for the following:    Squamous Epithelial / LPF MANY (*)    Bacteria, UA MANY (*)    Casts HYALINE CASTS (*)    All other components within normal limits  CBG MONITORING, ED - Abnormal; Notable for the following:    Glucose-Capillary 108 (*)    All other components within normal limits  URINE CULTURE  CBC  AMMONIA  URINE RAPID DRUG SCREEN (HOSP PERFORMED)    Imaging Review Ct Head Wo Contrast  02/16/2014   CLINICAL DATA:  Abnormal behavior.  EXAM: CT HEAD WITHOUT CONTRAST  TECHNIQUE: Contiguous axial images were obtained from the base of the skull through the vertex without intravenous contrast.  COMPARISON:  No prior.  FINDINGS: No intra-axial or extra-axial pathologic fluid or blood collection identified. No mass lesion. No hydrocephalus. No hemorrhage . Orbits are unremarkable. Paranasal sinuses and mastoids are clear. No acute bony abnormality.  IMPRESSION: No acute abnormality.   Electronically Signed   By: Marcello Moores  Register   On: 02/16/2014 21:07     EKG Interpretation   Date/Time:  Tuesday February 16 2014 20:23:53 EDT Ventricular Rate:  107 PR Interval:    QRS Duration: 92 QT Interval:  353 QTC Calculation: 471 R Axis:   47 Text Interpretation:  Atrial fibrillation Nonspecific repol abnormality,  anterior leads Confirmed by Naraya Stoneberg  MD, Kandee Escalante ([id number]) on 02/16/2014  10:40:41 PM      MDM   Final diagnoses:  Hallucinations  Disorientation  Atrial fibrillation with RVR    Patient with progressive decline over a period of years with  increasing confusion, disorientation. Diagnosis is unclear, family is uncertain what she is being treated for. They do report a psychiatric hospitalization many years ago. Since then she has seen a specialist for her confusion and hallucinations, it sounds as she has been treated for dementia, but there may be a more significant diagnosis from pre-existing psychosis.  She had acute worsening when the daughter went on vacation. She had an episode of wandering and increased confusion. Since then she has become progressively more paranoid and delusional. She barricaded herself in the home today. Upon arrival to the ER  she is not answering questions for me. She is agitated and confused.  Medical workup has revealed atrial fibrillation. She has a mildly rapid ventricular response in the low 100s, goes to 130's when she tries to get up and move. Daughter is uncertain if she has had atrial fibrillation before, she thinks that she might have. There are no records of A. fib in the electronic medical record. This was treated with IV Cardizem. EKG does not suggest ischemia or infarct.  Presentation is consistent with acute psychiatric illness, possibly secondary to dementia or pre-existing psychosis. She does, however, have atrial fibrillation with rapid ventricular response which would preclude medical clearance for psychiatric evaluation. She will require hospitalization for management of her atrial fibrillation prior to psychiatric treatment.  Addendum: Discussed with Doctor Caryl Comes, cardiology. Specifically, discussed rate control, and it was recommended by mouth Cardizem every 6 hours. No anticoagulation recommended based on the patient's agitation, wandering - she is a fall risk. Mildly elevated troponin should be cycled. If it does not significantly elevate, and no further interventions would be recommended. If Troponin significantly elevates, reconsult cardiology for further management.  Orpah Greek,  MD 02/17/14 2119  Orpah Greek, MD 02/17/14 [phone]

## 2014-02-16 NOTE — ED Notes (Signed)
Bed: [facility] Expected date:  Expected time:  Means of arrival:  Comments: EMS 80yo altered mental status

## 2014-02-16 NOTE — ED Notes (Signed)
Patient transported to CT 

## 2014-02-17 ENCOUNTER — Encounter (HOSPITAL_COMMUNITY): Payer: Self-pay | Admitting: Physician Assistant

## 2014-02-17 DIAGNOSIS — R7989 Other specified abnormal findings of blood chemistry: Secondary | ICD-10-CM

## 2014-02-17 DIAGNOSIS — I10 Essential (primary) hypertension: Secondary | ICD-10-CM | POA: Diagnosis present

## 2014-02-17 DIAGNOSIS — R778 Other specified abnormalities of plasma proteins: Secondary | ICD-10-CM | POA: Diagnosis present

## 2014-02-17 DIAGNOSIS — R443 Hallucinations, unspecified: Secondary | ICD-10-CM | POA: Diagnosis present

## 2014-02-17 DIAGNOSIS — F29 Unspecified psychosis not due to a substance or known physiological condition: Secondary | ICD-10-CM | POA: Diagnosis present

## 2014-02-17 DIAGNOSIS — F22 Delusional disorders: Secondary | ICD-10-CM | POA: Diagnosis present

## 2014-02-17 DIAGNOSIS — N390 Urinary tract infection, site not specified: Secondary | ICD-10-CM | POA: Diagnosis present

## 2014-02-17 DIAGNOSIS — G4733 Obstructive sleep apnea (adult) (pediatric): Secondary | ICD-10-CM | POA: Diagnosis present

## 2014-02-17 DIAGNOSIS — M129 Arthropathy, unspecified: Secondary | ICD-10-CM | POA: Diagnosis present

## 2014-02-17 DIAGNOSIS — Z961 Presence of intraocular lens: Secondary | ICD-10-CM | POA: Diagnosis not present

## 2014-02-17 DIAGNOSIS — Z79899 Other long term (current) drug therapy: Secondary | ICD-10-CM | POA: Diagnosis not present

## 2014-02-17 DIAGNOSIS — Z9849 Cataract extraction status, unspecified eye: Secondary | ICD-10-CM | POA: Diagnosis not present

## 2014-02-17 DIAGNOSIS — R404 Transient alteration of awareness: Secondary | ICD-10-CM

## 2014-02-17 DIAGNOSIS — I248 Other forms of acute ischemic heart disease: Secondary | ICD-10-CM | POA: Diagnosis present

## 2014-02-17 DIAGNOSIS — I4891 Unspecified atrial fibrillation: Secondary | ICD-10-CM | POA: Diagnosis present

## 2014-02-17 DIAGNOSIS — A498 Other bacterial infections of unspecified site: Secondary | ICD-10-CM | POA: Diagnosis present

## 2014-02-17 DIAGNOSIS — Z6838 Body mass index (BMI) 38.0-38.9, adult: Secondary | ICD-10-CM | POA: Diagnosis not present

## 2014-02-17 DIAGNOSIS — R4182 Altered mental status, unspecified: Secondary | ICD-10-CM | POA: Diagnosis present

## 2014-02-17 DIAGNOSIS — Z96659 Presence of unspecified artificial knee joint: Secondary | ICD-10-CM | POA: Diagnosis not present

## 2014-02-17 DIAGNOSIS — K219 Gastro-esophageal reflux disease without esophagitis: Secondary | ICD-10-CM | POA: Diagnosis present

## 2014-02-17 LAB — COMPREHENSIVE METABOLIC PANEL
ALT: 15 U/L (ref 0–35)
AST: 21 U/L (ref 0–37)
Albumin: 3.7 g/dL (ref 3.5–5.2)
Alkaline Phosphatase: 72 U/L (ref 39–117)
Anion gap: 14 (ref 5–15)
BUN: 23 mg/dL (ref 6–23)
CALCIUM: 9.5 mg/dL (ref 8.4–10.5)
CO2: 22 meq/L (ref 19–32)
CREATININE: 0.78 mg/dL (ref 0.50–1.10)
Chloride: 107 mEq/L (ref 96–112)
GFR calc Af Amer: 89 mL/min — ABNORMAL LOW (ref >90)
GFR, EST NON AFRICAN AMERICAN: 77 mL/min — AB (ref >90)
Glucose, Bld: 109 mg/dL — ABNORMAL HIGH (ref 70–99)
Potassium: 3.9 mEq/L (ref 3.7–5.3)
Sodium: 143 mEq/L (ref 137–147)
Total Bilirubin: 0.6 mg/dL (ref 0.3–1.2)
Total Protein: 6.6 g/dL (ref 6.0–8.3)

## 2014-02-17 LAB — CBC
HCT: 42.6 % (ref 36.0–46.0)
Hemoglobin: 13.7 g/dL (ref 12.0–15.0)
MCH: 30.9 pg (ref 26.0–34.0)
MCHC: 32.2 g/dL (ref 30.0–36.0)
MCV: 96.2 fL (ref 78.0–100.0)
Platelets: 199 10*3/uL (ref 150–400)
RBC: 4.43 MIL/uL (ref 3.87–5.11)
RDW: 13.5 % (ref 11.5–15.5)
WBC: 5.9 10*3/uL (ref 4.0–10.5)

## 2014-02-17 LAB — HEMOGLOBIN A1C
Hgb A1c MFr Bld: 5.6 % (ref <5.7)
MEAN PLASMA GLUCOSE: 114 mg/dL (ref <117)

## 2014-02-17 LAB — TROPONIN I

## 2014-02-17 LAB — TSH: TSH: 1.58 u[IU]/mL (ref 0.350–4.500)

## 2014-02-17 LAB — GLUCOSE, CAPILLARY
Glucose-Capillary: 102 mg/dL — ABNORMAL HIGH (ref 70–99)
Glucose-Capillary: 127 mg/dL — ABNORMAL HIGH (ref 70–99)

## 2014-02-17 LAB — T4, FREE: Free T4: 1.13 ng/dL (ref 0.80–1.80)

## 2014-02-17 LAB — I-STAT TROPONIN, ED: Troponin i, poc: 0.12 ng/mL (ref 0.00–0.08)

## 2014-02-17 MED ORDER — HALOPERIDOL LACTATE 5 MG/ML IJ SOLN
2.0000 mg | INTRAMUSCULAR | Status: AC | PRN
  Administered 2014-02-17 (×2): 2 mg via INTRAVENOUS
  Filled 2014-02-17 (×2): qty 1

## 2014-02-17 MED ORDER — ACETAMINOPHEN 650 MG RE SUPP: 650.0000 mg | Freq: Four times a day (QID) | RECTAL | Status: DC | PRN

## 2014-02-17 MED ORDER — QUETIAPINE FUMARATE 25 MG PO TABS
25.0000 mg | ORAL_TABLET | Freq: Every day | ORAL | Status: DC
  Filled 2014-02-17: qty 1

## 2014-02-17 MED ORDER — SODIUM CHLORIDE 0.9 % IJ SOLN
3.0000 mL | Freq: Two times a day (BID) | INTRAMUSCULAR | Status: DC
  Administered 2014-02-17 – 2014-02-22 (×12): 3 mL via INTRAVENOUS

## 2014-02-17 MED ORDER — FOLIC ACID 1 MG PO TABS
1.0000 mg | ORAL_TABLET | Freq: Every day | ORAL | Status: DC
  Administered 2014-02-17 – 2014-02-23 (×7): 1 mg via ORAL
  Filled 2014-02-17 (×7): qty 1

## 2014-02-17 MED ORDER — PANTOPRAZOLE SODIUM 40 MG PO TBEC
40.0000 mg | DELAYED_RELEASE_TABLET | Freq: Every day | ORAL | Status: DC
  Administered 2014-02-17 – 2014-02-23 (×7): 40 mg via ORAL
  Filled 2014-02-17 (×7): qty 1

## 2014-02-17 MED ORDER — HEPARIN SODIUM (PORCINE) 5000 UNIT/ML IJ SOLN
5000.0000 [IU] | Freq: Three times a day (TID) | INTRAMUSCULAR | Status: DC
  Administered 2014-02-17 – 2014-02-23 (×19): 5000 [IU] via SUBCUTANEOUS
  Filled 2014-02-17 (×22): qty 1

## 2014-02-17 MED ORDER — DILTIAZEM HCL 60 MG PO TABS
60.0000 mg | ORAL_TABLET | Freq: Four times a day (QID) | ORAL | Status: DC
  Administered 2014-02-18 (×4): 60 mg via ORAL
  Filled 2014-02-17 (×9): qty 1

## 2014-02-17 MED ORDER — ZOLPIDEM TARTRATE 5 MG PO TABS: 5.0000 mg | ORAL_TABLET | Freq: Every evening | ORAL | Status: DC | PRN

## 2014-02-17 MED ORDER — DILTIAZEM HCL ER 60 MG PO CP12
60.0000 mg | ORAL_CAPSULE | Freq: Two times a day (BID) | ORAL | Status: DC
  Administered 2014-02-17 (×3): 60 mg via ORAL
  Filled 2014-02-17 (×3): qty 1

## 2014-02-17 MED ORDER — ASPIRIN EC 325 MG PO TBEC
325.0000 mg | DELAYED_RELEASE_TABLET | Freq: Every day | ORAL | Status: DC
  Administered 2014-02-17 – 2014-02-23 (×7): 325 mg via ORAL
  Filled 2014-02-17 (×7): qty 1

## 2014-02-17 MED ORDER — ACETAMINOPHEN 325 MG PO TABS
650.0000 mg | ORAL_TABLET | Freq: Four times a day (QID) | ORAL | Status: DC | PRN
  Administered 2014-02-17 – 2014-02-18 (×3): 650 mg via ORAL
  Filled 2014-02-17 (×3): qty 2

## 2014-02-17 MED ORDER — VITAMIN B-1 100 MG PO TABS
100.0000 mg | ORAL_TABLET | Freq: Every day | ORAL | Status: DC
  Administered 2014-02-17 – 2014-02-23 (×6): 100 mg via ORAL
  Filled 2014-02-17 (×7): qty 1

## 2014-02-17 MED ORDER — ADULT MULTIVITAMIN W/MINERALS CH
1.0000 | ORAL_TABLET | Freq: Every day | ORAL | Status: DC
  Administered 2014-02-17 – 2014-02-23 (×6): 1 via ORAL
  Filled 2014-02-17 (×7): qty 1

## 2014-02-17 MED ORDER — QUETIAPINE FUMARATE 25 MG PO TABS
25.0000 mg | ORAL_TABLET | Freq: Two times a day (BID) | ORAL | Status: DC
  Administered 2014-02-17 – 2014-02-20 (×8): 25 mg via ORAL
  Filled 2014-02-17 (×10): qty 1

## 2014-02-17 MED ORDER — DILTIAZEM HCL ER 60 MG PO CP12: 60.0000 mg | ORAL_CAPSULE | Freq: Four times a day (QID) | ORAL | Status: DC

## 2014-02-17 NOTE — Consult Note (Signed)
Cardiology Consultation Note  Patient ID: Monica Strong, MRN: [id number], DOB/AGE: 1932/01/30 80 y.o. Admit date: 02/16/2014   Date of Consult: 02/17/2014 Primary Physician: Vena Austria, MD Primary Cardiologist:  Fransico Him  Chief Complaint: AMS Reason for Consult: atrial fibrillation  HPI: Monica Strong is an 80 y/o F with history of normal stress test 08/2008 and CTA negative for PE at that time, hiatal hernia, remote vertigo, prior pulmonary nodules who presented to [facility] with altered mental status and paranoia. The patient's daughter brought her in because she was wandering and not behaving appropriately. She has been more confused than usual. She apparently barricaded herself in her home and the police had to be called. She required a sitter during this admission due to wandering int he ED as well. As part of her workup an EKG was done and she was found to be in atrial fibrillation with only mild rate elevation 105-110. CT head nonacute and no prior stroke noted. CXR nonacute. Labs showed POC troponin 0.12 but f/u value was normal. Aside from mild hyperglycemia (A1C 5.6), labs were normal including CMET, CBC, TSH, and free T4. UA showed small bili, 100 protein, trace leuks, small Hgb, many bacteria but many epis as well. UCx pending. UDS negative. She was started on ASA 325mg  daily and Diltiazem SR 60mg  BID. It appears from BP she also likely has had underlying HTN as well. 2D echo pending.  Past Medical History  Diagnosis Date  . Renal stones left  . GERD (gastroesophageal reflux disease)   . H/O hiatal hernia   . Pulmonary nodules BENIGN--  MONITORED BY PCP DR READE--  ASYMPTOMATIC     LAST CHEST CT 08-17-2011  . Heart murmur MILD -- ASYMPTOMATIC  . Urge urinary incontinence   . Sinus drainage   . Neuromuscular disorder     rt hand numbness  . Dysuria     has urethral bump  . Ureteral stent retained   . Arthritis   . OSA (obstructive sleep apnea)     not using cpap  .  Paranoia   . Obesity   . Chest pain     a. Normal stress test 08/2008 and CTA neg for PE at that time.  [facility] ear dysfunction     a. Prior h/o vertigo.      Most Recent Cardiac Studies: Nuc 07/2008 IMPRESSION:  Normal study demonstrating no evidence of inducible ischemia with  administration of adenosine or fixed perfusion defects. Left  ventricular function is normal.  2D echo pending    Surgical History:  Past Surgical History  Procedure Laterality Date  . Total knee arthroplasty  10-17-2009  DR ALUISIO    LEFT  . Right total knee arthroplasty  04-25-2009  . Tonsillectomy    . Cardiovascular stress test  03-13-2007  dr Tressia Miners turner    NORMAL LV SIZE SYSTOLIC FUCTION/ NO ISCHEMIA/ EF 85%  . Transthoracic echocardiogram  08-18-2008    NORMAL LVSF/ EF 55-60%/ MILD MITRAL REGURG .  Marland Kitchen Removal breast cyst, benign  1960'S  . Vaginal hysterectomy  1970  . Cataract extraction w/ intraocular lens  implant, bilateral    . Blepharoplasty      BILATERAL  . Cystoscopy with retrograde pyelogram, ureteroscopy and stent placement  05/14/2012    Procedure: [place], URETEROSCOPY AND STENT PLACEMENT;  Surgeon: Alexis Frock, MD;  Location: [facility];  Service: Urology;  Laterality: Left;  1ST STAGE LEFT URETEROSCOPY, LEFT RETROGRADE, DIGITAL  URETEROSCOPY,  LEFT STONE REMOVAL WITH ESCAPE BASKET,  STENT PLACEMENt   . Holmium laser application  67/11/1948    Procedure: HOLMIUM LASER APPLICATION;  Surgeon: Alexis Frock, MD;  Location: [facility];  Service: Urology;  Laterality: Left;  . Holmium laser application  02/11/2670    Procedure: HOLMIUM LASER APPLICATION;  Surgeon: Alexis Frock, MD;  Location: [facility];  Service: Urology;  Laterality: Left;  . Cystoscopy/retrograde/ureteroscopy/stone extraction with basket  06/13/2012    Procedure: CYSTOSCOPY/RETROGRADE/URETEROSCOPY/STONE EXTRACTION WITH BASKET;  Surgeon:  Alexis Frock, MD;  Location: [facility];  Service: Urology;  Laterality: Left;  . Cystoscopy w/ ureteral stent placement  06/13/2012    Procedure: CYSTOSCOPY WITH STENT REPLACEMENT;  Surgeon: Alexis Frock, MD;  Location: [facility];  Service: Urology;  Laterality: Left;  . Appendectomy      PT STATES PER XRAY SMALL AMOUNT OF APPENDIX LEFT  . Cystoscopy w/ ureteral stent removal  07/11/2012    Procedure: CYSTOSCOPY WITH STENT REMOVAL;  Surgeon: Alexis Frock, MD;  Location: [facility];  Service: Urology;  Laterality: Left;  . Cystoscopy w/ retrogrades  07/11/2012    Procedure: CYSTOSCOPY WITH RETROGRADE PYELOGRAM;  Surgeon: Alexis Frock, MD;  Location: [facility];  Service: Urology;  Laterality: Left;  left third stage ureteroscopystone manipulation  . Ureteroscopy  07/11/2012    Procedure: URETEROSCOPY;  Surgeon: Alexis Frock, MD;  Location: [facility];  Service: Urology;  Laterality: Left;     Home Meds: Prior to Admission medications   Medication Sig Start Date End Date Taking? Authorizing Provider  omeprazole (PRILOSEC) 20 MG capsule Take 20 mg by mouth daily.   Yes Historical Provider, MD  QUEtiapine (SEROQUEL) 25 MG tablet Take 25 mg by mouth at bedtime.   Yes Historical Provider, MD  sennosides-docusate sodium (SENOKOT-S) 8.6-50 MG tablet Take 1 tablet by mouth 2 (two) times daily. While taking pain meds to prevent constipation 07/11/12   Alexis Frock, MD    Inpatient Medications:  . aspirin EC  325 mg Oral Daily  . diltiazem  60 mg Oral Q12H  . folic acid  1 mg Oral Daily  . heparin  5,000 Units Subcutaneous 3 times per day  . multivitamin with minerals  1 tablet Oral Daily  . pantoprazole  40 mg Oral Daily  . QUEtiapine  25 mg Oral BID  . sodium chloride  3 mL Intravenous Q12H  . thiamine  100 mg Oral Daily      Allergies:  Allergies  Allergen Reactions  . Diphenhydramine Other (See  Comments)    UNKNOWN  . Flagyl [Metronidazole] Other (See Comments)    HALLUCINATIONS  . Hydrocodone Other (See Comments)    HALLUCINATIONS  . Meperidine Hcl Nausea And Vomiting  . Penicillins Hives  . Sulfonamide Derivatives Hives    History   Social History  . Marital Status: Divorced    Spouse Name: N/A    Number of Children: N/A  . Years of Education: N/A   Occupational History  . Not on file.   Social History Main Topics  . Smoking status: Never Smoker   . Smokeless tobacco: Never Used  . Alcohol Use: No  . Drug Use: No  . Sexual Activity:    Other Topics Concern  . Not on file   Social History Narrative  . No narrative on file     Family History  Problem Relation Age of Onset  . Other  Pt unaware due to psych condition     Review of Systems: Denies presyncope or current chest pain. H/o intermittent L arm pain. All other systems reviewed and are otherwise negative except as noted above.  Labs:  Recent Labs  02/17/14 1339  TROPONINI <0.30   Lab Results  Component Value Date   WBC 5.9 02/17/2014   HGB 13.7 02/17/2014   HCT 42.6 02/17/2014   MCV 96.2 02/17/2014   PLT 199 02/17/2014    Recent Labs Lab 02/17/14 0430  NA 143  K 3.9  CL 107  CO2 22  BUN 23  CREATININE 0.78  CALCIUM 9.5  PROT 6.6  BILITOT 0.6  ALKPHOS 72  ALT 15  AST 21  GLUCOSE 109*   Radiology/Studies:  Ct Head Wo Contrast 02/16/2014   CLINICAL DATA:  Abnormal behavior.  EXAM: CT HEAD WITHOUT CONTRAST  TECHNIQUE: Contiguous axial images were obtained from the base of the skull through the vertex without intravenous contrast.  COMPARISON:  No prior.  FINDINGS: No intra-axial or extra-axial pathologic fluid or blood collection identified. No mass lesion. No hydrocephalus. No hemorrhage . Orbits are unremarkable. Paranasal sinuses and mastoids are clear. No acute bony abnormality.  IMPRESSION: No acute abnormality.   Electronically Signed   By: Marcello Moores  Register   On: 02/16/2014  21:07   Dg Chest Port 1 View 02/17/2014   CLINICAL DATA:  Altered mental status.  Low saturations.  EXAM: PORTABLE CHEST - 1 VIEW  COMPARISON:  None.  FINDINGS: Shallow inspiration. Borderline heart size and pulmonary vascularity. This is likely normal for technique. No focal airspace disease or consolidation in the lungs. No blunting of costophrenic angles. No pneumothorax. Calcification of aorta. Degenerative changes in the spine.  IMPRESSION: No evidence of active pulmonary disease.   Electronically Signed   By: Lucienne Capers M.D.   On: 02/17/2014 00:05    EKG: atrial fibrillation 107bpm nonspecific ST-T changes  Physical Exam:   Blood pressure 154/78, pulse 106, temperature 98.4 F (36.9 C), temperature source Oral, resp. rate 18, height 5\' 2"  (1.575 m), weight 212 lb 8.4 oz (96.4 kg), SpO2 96.00%. General: Well developed, well nourished, in no acute distress. Head: Normocephalic, atraumatic, sclera non-icteric, no xanthomas, nares are without discharge.  Neck: Negative for carotid bruits. JVD not elevated. Lungs: Clear bilaterally to auscultation without wheezes, rales, or rhonchi. Breathing is unlabored. Heart: RRR with S1 S2. SEM no , rubs, or gallops appreciated. Abdomen: Soft, non-tender, non-distended with normoactive bowel sounds. No hepatomegaly. No rebound/guarding. No obvious abdominal masses. Msk:  Strength and tone appear normal for age. Extremities: No clubbing or cyanosis. No edema.  Distal pedal pulses are 2+ and equal bilaterally. Neuro: Alert and oriented X 3. No facial asymmetry. No focal deficit. Moves all extremities spontaneously. Psych:  Seems more oriented and converses easily at this time     Assessment and Plan:   Afib:  Daughter indicates previous history of afib Don't have [facility] records ECG;s in Epic reveal NSR back to 2013 Given her unstable mental status would not use anticoagulation at this time  Rate control with cardizem change to q 8 hrs.  Echo to  assess atrial size and EF  Mental Status:  A bit worrisome that she is unsupervised at home and daughters check in by phone Also worrisome that she is driving.  Dr Read is handling her antipsychotic meds.  Consider psych consult and f/u.    HTN:  Should be handled with cardizem alone.  Sleep apnea:  Apparently prescribed by Dr Radford Pax  Can initiate afib would see if she can be fitted and supplied with CPAP while in [place]

## 2014-02-17 NOTE — H&P (Addendum)
[facility] History and Physical  AMRI LIEN JHE:[id number] DOB: [birth year] DOA: 02/16/2014  Referring physician: Joseph Berkshire, MD PCP: Vena Austria, MD   Chief Complaint: Confused  HPI: Monica Strong is a 80 y.o. female presents with being paranoid delusional and confused. Patients daughter brought her in the ED as she had been wandering and was not behaving appropriately. Patient was more confused than usual. Patient apparently barricaded herself in her home. Patients daughter had to call the police. At this time she is obviously confused does not answer appropriately. In the ED she was found to be in atrial fibrillation. This is a new finding. Initially her rate was about 105-110 and after she was given cardiazem she is now rate controlled at 75bpm. Her daughter is not here now and the patient is not able to provide any further history. Review of her chart shows no prior history of Atrial Fibrillation and also no thyroid issues   Review of Systems:  Patient is not able to provide a full ROS due to her psychiatric condition  Past Medical History  Diagnosis Date  . Renal stones left  . GERD (gastroesophageal reflux disease)   . H/O hiatal hernia   . Pulmonary nodules BENIGN--  MONITORED BY PCP DR READE--  ASYMPTOMATIC     LAST CHEST CT 08-17-2011  . Heart murmur MILD -- ASYMPTOMATIC  . Urge urinary incontinence   . Sinus drainage   . Neuromuscular disorder     rt hand numbness  . Dysuria     has urethral bump  . Ureteral stent retained   . Arthritis   . OSA on CPAP   . OSA (obstructive sleep apnea)     not using cpap   Past Surgical History  Procedure Laterality Date  . Total knee arthroplasty  10-17-2009  DR ALUISIO    LEFT  . Right total knee arthroplasty  04-25-2009  . Tonsillectomy    . Cardiovascular stress test  03-13-2007  dr Tressia Miners turner    NORMAL LV SIZE SYSTOLIC FUCTION/ NO ISCHEMIA/ EF 85%  . Transthoracic echocardiogram  08-18-2008    NORMAL LVSF/ EF 55-60%/ MILD MITRAL REGURG .  Marland Kitchen Removal breast cyst, benign  1960'S  . Vaginal hysterectomy  1970  . Cataract extraction w/ intraocular lens  implant, bilateral    . Blepharoplasty      BILATERAL  . Cystoscopy with retrograde pyelogram, ureteroscopy and stent placement  05/14/2012    Procedure: [place], URETEROSCOPY AND STENT PLACEMENT;  Surgeon: Alexis Frock, MD;  Location: [facility];  Service: Urology;  Laterality: Left;  1ST STAGE LEFT URETEROSCOPY, LEFT RETROGRADE, DIGITAL URETEROSCOPY,  LEFT STONE REMOVAL WITH ESCAPE BASKET,  STENT PLACEMENt   . Holmium laser application  18/10/6312    Procedure: HOLMIUM LASER APPLICATION;  Surgeon: Alexis Frock, MD;  Location: [facility];  Service: Urology;  Laterality: Left;  . Holmium laser application  02/15/262    Procedure: HOLMIUM LASER APPLICATION;  Surgeon: Alexis Frock, MD;  Location: [facility];  Service: Urology;  Laterality: Left;  . Cystoscopy/retrograde/ureteroscopy/stone extraction with basket  06/13/2012    Procedure: CYSTOSCOPY/RETROGRADE/URETEROSCOPY/STONE EXTRACTION WITH BASKET;  Surgeon: Alexis Frock, MD;  Location: [facility];  Service: Urology;  Laterality: Left;  . Cystoscopy w/ ureteral stent placement  06/13/2012    Procedure: CYSTOSCOPY WITH STENT REPLACEMENT;  Surgeon: Alexis Frock, MD;  Location: [facility];  Service: Urology;  Laterality: Left;  . Appendectomy  PT STATES PER XRAY SMALL AMOUNT OF APPENDIX LEFT  . Cystoscopy w/ ureteral stent removal  07/11/2012    Procedure: CYSTOSCOPY WITH STENT REMOVAL;  Surgeon: Alexis Frock, MD;  Location: [facility];  Service: Urology;  Laterality: Left;  . Cystoscopy w/ retrogrades  07/11/2012    Procedure: CYSTOSCOPY WITH RETROGRADE PYELOGRAM;  Surgeon: Alexis Frock, MD;  Location: [facility];  Service: Urology;   Laterality: Left;  left third stage ureteroscopystone manipulation  . Ureteroscopy  07/11/2012    Procedure: URETEROSCOPY;  Surgeon: Alexis Frock, MD;  Location: [facility];  Service: Urology;  Laterality: Left;   Social History:  reports that she has never smoked. She has never used smokeless tobacco. She reports that she does not drink alcohol or use illicit drugs.  Allergies  Allergen Reactions  . Diphenhydramine Other (See Comments)    UNKNOWN  . Flagyl [Metronidazole] Other (See Comments)    HALLUCINATIONS  . Hydrocodone Other (See Comments)    HALLUCINATIONS  . Meperidine Hcl Nausea And Vomiting  . Penicillins Hives  . Sulfonamide Derivatives Hives    History reviewed. No pertinent family history.   Prior to Admission medications   Medication Sig Start Date End Date Taking? Authorizing Provider  omeprazole (PRILOSEC) 20 MG capsule Take 20 mg by mouth daily.   Yes Historical Provider, MD  QUEtiapine (SEROQUEL) 25 MG tablet Take 25 mg by mouth at bedtime.   Yes Historical Provider, MD  sennosides-docusate sodium (SENOKOT-S) 8.6-50 MG tablet Take 1 tablet by mouth 2 (two) times daily. While taking pain meds to prevent constipation 07/11/12   Alexis Frock, MD   Physical Exam: Filed Vitals:   02/16/14 2138  BP: 161/87  Pulse: 81  Resp: 18  SpO2: 94%    Wt Readings from Last 3 Encounters:  05/21/13 99.837 kg (220 lb 1.6 oz)  02/19/13 97.206 kg (214 lb 4.8 oz)  07/11/12 97.212 kg (214 lb 5 oz)    General:  Appears comfortable somewhat agitated Eyes: PERRL, normal lids, irises & conjunctiva ENT: grossly normal hearing, lips & tongue Neck: no LAD, masses or thyromegaly Cardiovascular: IRR, no m/r/g. No LE edema. Telemetry: Atrial Fibrillation rate controlled Respiratory: CTA bilaterally, no w/r/r. Normal respiratory effort. Abdomen: soft, ntnd Skin: no rash or induration seen on limited exam Musculoskeletal: grossly normal tone BUE/BLE Psychiatric:  delusional and paranoid thoughts Neurologic: unable to assess          Labs on Admission:  Basic Metabolic Panel:  Recent Labs Lab 02/16/14 2041  NA 146  K 3.8  CL 108  CO2 24  GLUCOSE 116*  BUN 25*  CREATININE 0.90  CALCIUM 9.8   Liver Function Tests:  Recent Labs Lab 02/16/14 2041  AST 25  ALT 17  ALKPHOS 74  BILITOT 0.6  PROT 7.0  ALBUMIN 3.9   No results found for this basename: LIPASE, AMYLASE,  in the last 168 hours  Recent Labs Lab 02/16/14 2053  AMMONIA 31   CBC:  Recent Labs Lab 02/16/14 2041  WBC 5.9  HGB 14.5  HCT 44.3  MCV 95.3  PLT 203   Cardiac Enzymes: No results found for this basename: CKTOTAL, CKMB, CKMBINDEX, TROPONINI,  in the last 168 hours  BNP (last 3 results) No results found for this basename: PROBNP,  in the last 8760 hours CBG:  Recent Labs Lab 02/16/14 2026  GLUCAP 108*    Radiological Exams on Admission: Ct Head Wo Contrast  02/16/2014  CLINICAL DATA:  Abnormal behavior.  EXAM: CT HEAD WITHOUT CONTRAST  TECHNIQUE: Contiguous axial images were obtained from the base of the skull through the vertex without intravenous contrast.  COMPARISON:  No prior.  FINDINGS: No intra-axial or extra-axial pathologic fluid or blood collection identified. No mass lesion. No hydrocephalus. No hemorrhage . Orbits are unremarkable. Paranasal sinuses and mastoids are clear. No acute bony abnormality.  IMPRESSION: No acute abnormality.   Electronically Signed   By: Marcello Moores  Register   On: 02/16/2014 21:07   Dg Chest Port 1 View  02/17/2014   CLINICAL DATA:  Altered mental status.  Low saturations.  EXAM: PORTABLE CHEST - 1 VIEW  COMPARISON:  None.  FINDINGS: Shallow inspiration. Borderline heart size and pulmonary vascularity. This is likely normal for technique. No focal airspace disease or consolidation in the lungs. No blunting of costophrenic angles. No pneumothorax. Calcification of aorta. Degenerative changes in the spine.  IMPRESSION: No  evidence of active pulmonary disease.   Electronically Signed   By: Lucienne Capers M.D.   On: 02/17/2014 00:05    EKG: Independently reviewed. Atrial Fibrillation  Assessment/Plan Principal Problem:   Atrial fibrillation Active Problems:   Altered mental status   Paranoia   1. New Onset Atrial Fibrillation -she will be admitted to telemetry -responded nicely to one dose of cardiazem -will start on oral cardiazem -cardiology consult -will get TSH and T4  2. Altered Mental Status -likely due to underlying psych issues -will eventually need psych admit -will require 1:1 as she has been wandering off in the ED  3. Paranoia -will continue with her current meds from home -psych consult    Code Status: Full Code (must indicate code status--if unknown or must be presumed, indicate so) DVT Prophylaxis:Heparin Family Communication: None (indicate person spoken with, if applicable, with phone number if by telephone) Disposition Plan: Psych (indicate anticipated LOS)  Time spent: 21min  Adalyne Lovick A [facility] Pager [phone]  **Disclaimer: This note may have been dictated with voice recognition software. Similar sounding words can inadvertently be transcribed and this note may contain transcription errors which may not have been corrected upon publication of note.**

## 2014-02-17 NOTE — Progress Notes (Signed)
Progress Note   Monica Strong ZOX:[id number] DOB: 1932-01-18 DOA: 02/16/2014 PCP: Vena Austria, MD   Brief Narrative:   Monica Strong is an 80 y.o. female with a PMH of paranoid psychosis and delusions intermittent over the past 5 years, recently started on Seroquel by PCP who was brought to the hospital 02/16/14 after becoming acutely paranoid and agitated necessitating intervention by the police department. Upon initial evaluation in the ED, the patient was noted to be in atrial fibrillation with heart rate 105-110. Psychiatry consultation is pending.  Assessment/Plan:   Principal Problem:   Atrial fibrillation / troponin level elevated  No prior history of atrophic fibrillation. Initial troponin elevated, no subsequent troponins checked, so will check serial troponins now. Elevated troponin likely from demand ischemia, but need to rule out underlying acute coronary syndrome.  Monitored on telemetry where she remained in atrial fibrillation.  Given 10 mg of IV Cardizem and placed on 60 mg every 12 hours. Heart rate 81-106.  TSH WNL.  CHADS2 = 1 (age > 74), no documented history of diabetes, hypertension, prior TIA/stroke, or heart failure. She has a annual 2.8% chance of stroke, but with current underlying psychiatric problems, there may be issues with compliance. We'll start on aspirin for now, and consider long-term therapeutic anticoagulation once her disposition is known.  Check 2-D echocardiogram.  Active Problems:   Altered mental status/Paranoia  The patient has a five-year history of intermittent psychotic events with extreme paranoia including believing that her neighbors are poisoning her and sending her messages from the television. She is currently fairly lucid. PCP recently started her on Seroquel which was effective when she first began this medication, but its effectiveness has waned over time.  Will ask psychiatry to evaluate her for underlying  psychiatric illness. Patient's daughter reports she did have a "nervous breakdown "in the distant past.  While awaiting psychiatry evaluation, increase Seroquel to twice a day dosing.  Continue Air cabin crew.    DVT Prophylaxis  Continue subcutaneous heparin.  Code Status: Full. Family Communication: Daughter at the bedside. Disposition Plan: Home when stable.   IV Access:    Peripheral IV   Procedures and diagnostic studies:   Ct Head Wo Contrast 02/16/2014: No acute abnormality.    Dg Chest Port 1 View 02/17/2014 : No evidence of active pulmonary disease.     Medical Consultants:    Psychiatry  Cardiology   Other Consultants:    None.   Anti-Infectives:    None.  Subjective:   Monica Strong denies palpitations or sensation of racing heart. She denies presyncope or current chest pain, although she does report intermittent left arm pain in the past. Although she is not actively psychotic at the moment, she clearly has paranoid delusions.  Objective:    Filed Vitals:   02/16/14 2138 02/17/14 0218 02/17/14 0600 02/17/14 1358  BP: 161/87 160/74 123/69 154/78  Pulse: 81 85 93 106  Temp:  98.2 F (36.8 C) 97.4 F (36.3 C) 98.4 F (36.9 C)  TempSrc:  Oral Oral Oral  Resp: 18 16 20 18   Height:  5\' 2"  (1.575 m)    Weight:  96.4 kg (212 lb 8.4 oz)    SpO2: 94% 96% 96% 96%    Intake/Output Summary (Last 24 hours) at 02/17/14 1452 Last data filed at 02/17/14 1226  Gross per 24 hour  Intake    840 ml  Output      2 ml  Net  838 ml    Exam: Gen:  NAD Cardiovascular:  Heart sounds irregularly irregular Respiratory:  Lungs CTAB Gastrointestinal:  Abdomen soft, NT/ND, + BS Extremities:  No C/E/C   Data Reviewed:    Labs: Basic Metabolic Panel:  Recent Labs Lab 02/16/14 2041 02/17/14 0430  NA 146 143  K 3.8 3.9  CL 108 107  CO2 24 22  GLUCOSE 116* 109*  BUN 25* 23  CREATININE 0.90 0.78  CALCIUM 9.8 9.5   GFR Estimated Creatinine  Clearance: 60.7 ml/min (by C-G formula based on Cr of 0.78). Liver Function Tests:  Recent Labs Lab 02/16/14 2041 02/17/14 0430  AST 25 21  ALT 17 15  ALKPHOS 74 72  BILITOT 0.6 0.6  PROT 7.0 6.6  ALBUMIN 3.9 3.7    Recent Labs Lab 02/16/14 2053  AMMONIA 31   CBC:  Recent Labs Lab 02/16/14 2041 02/17/14 0430  WBC 5.9 5.9  HGB 14.5 13.7  HCT 44.3 42.6  MCV 95.3 96.2  PLT 203 199   Cardiac Enzymes:  Recent Labs Lab 02/17/14 1339  TROPONINI <0.30   CBG:  Recent Labs Lab 02/16/14 2026 02/17/14 0713  GLUCAP 108* 102*   Hgb A1c:  Recent Labs  02/17/14 0430  HGBA1C 5.6   Thyroid function studies:  Recent Labs  02/17/14 0430  TSH 1.580   Microbiology No results found for this or any previous visit (from the past 240 hour(s)).   Medications:   . aspirin EC  325 mg Oral Daily  . diltiazem  60 mg Oral Q12H  . folic acid  1 mg Oral Daily  . heparin  5,000 Units Subcutaneous 3 times per day  . multivitamin with minerals  1 tablet Oral Daily  . pantoprazole  40 mg Oral Daily  . QUEtiapine  25 mg Oral QHS  . sodium chloride  3 mL Intravenous Q12H  . thiamine  100 mg Oral Daily   Continuous Infusions:   Time spent: 35 minutes with > 50% of time discussing current diagnostic test results, clinical impression and plan of care.   LOS: 1 day   Monica Strong  [facility] Pager [phone]. If unable to reach me by pager, please call my cell phone at [phone].  *Please refer to [email or web address], password [email or web address] to get updated schedule on who will round on this patient, as hospitalists switch teams weekly. If 7PM-7AM, please contact night-coverage at [email or web address], password [email or web address] for any overnight needs.  02/17/2014, 2:52 PM

## 2014-02-18 DIAGNOSIS — F29 Unspecified psychosis not due to a substance or known physiological condition: Secondary | ICD-10-CM

## 2014-02-18 DIAGNOSIS — I319 Disease of pericardium, unspecified: Secondary | ICD-10-CM

## 2014-02-18 DIAGNOSIS — R4182 Altered mental status, unspecified: Secondary | ICD-10-CM

## 2014-02-18 DIAGNOSIS — R443 Hallucinations, unspecified: Secondary | ICD-10-CM

## 2014-02-18 DIAGNOSIS — I10 Essential (primary) hypertension: Secondary | ICD-10-CM

## 2014-02-18 LAB — GLUCOSE, CAPILLARY
GLUCOSE-CAPILLARY: 110 mg/dL — AB (ref 70–99)
Glucose-Capillary: 130 mg/dL — ABNORMAL HIGH (ref 70–99)

## 2014-02-18 LAB — TROPONIN I: Troponin I: <0.3 ng/mL (ref <0.30)

## 2014-02-18 NOTE — Consult Note (Signed)
[facility] Face-to-Face Psychiatry Consult   Reason for Consult:  Psychosis Referring Physician:  Dr. Rowe Clack is an 80 y.o. female. Total Time spent with patient: 45 minutes I have met with patient and have also met with daughter, who is at bedside, and another daughter , who was on the phone. HPI -Patient is an 80 year old female , who lives alone ( daughter lives a mile away). She recently had episode of  Agitation/psychosis. Report from daughter is that patient felt she was in some danger, and barricaded herself at home , to where police had to be involved. There have been several similar, but less severe instances recently. Patient has had visual and auditory hallucinations regarding seeing people in her home that she suspects are her neighbors, and has installed several locks in her doors, and she has also at times placed clothes in different parts of the house " in case I had to escape in a hurry". She has a delusion that she may be the victim of withcraft. As per family and patient, there has also been a gradual decline in cognitive abilty, particularly short term memory loss  Her PCP had prescribed Seroquel, which patient states she takes " but I do forget to take it a lot". Family seems skeptic regarding her medication compliance. Psychiatric history- it appears, as discussed with family and patient, that she may have had episodes of paranoia/low grade psychosis going back many years, but of less severity and not causing compromise of her ability to function independently. She did have one psychiatric hospitalization about 40 years ago, but she does not remember details. Of note, she denies depression, denies anxiety, and currently does not appear depressed. This does not seem to be related to a mood episode with psychotic features. Patient denies any alcohol or drug abuse. ROS is negative for headaches, visual changes, lateralization, (+) for eye cataracts, negative for chest pain,  Shortness of breath, (+)  For abdominal pain related to " hyatal hernia", negative for melenas, negative for dysuria/ urgency, positive for history knee replacement, knee pain. Social History- divorced, retired Network engineer, lives alone, two adult daughters, one of whom lives nearby, Assessment: AXIS I:  Psychosis NOS AXIS II:  Deferred AXIS III:   Past Medical History  Diagnosis Date  . Renal stones left  . GERD (gastroesophageal reflux disease)   . H/O hiatal hernia   . Pulmonary nodules BENIGN--  MONITORED BY PCP DR READE--  ASYMPTOMATIC     LAST CHEST CT 08-17-2011  . Heart murmur MILD -- ASYMPTOMATIC  . Urge urinary incontinence   . Sinus drainage   . Neuromuscular disorder     rt hand numbness  . Dysuria     has urethral bump  . Ureteral stent retained   . Arthritis   . OSA (obstructive sleep apnea)     not using cpap  . Paranoia   . Obesity   . Chest pain     a. Normal stress test 08/2008 and CTA neg for PE at that time.  Jola Baptist ear dysfunction     a. Prior h/o vertigo.   AXIS IV:  difficulty functioning at premorbid level of functioning AXIS V:  31-40 impairment in reality testing  Plan:  No evidence of imminent risk to self or others at present.   See below  Subjective:   Monica Strong is a 80 y.o. female patient admitted with psychotic symptoms, disorganized behavior  HPI:  See Above  Past Psychiatric History: Past Medical History  Diagnosis Date  . Renal stones left  . GERD (gastroesophageal reflux disease)   . H/O hiatal hernia   . Pulmonary nodules BENIGN--  MONITORED BY PCP DR READE--  ASYMPTOMATIC     LAST CHEST CT 08-17-2011  . Heart murmur MILD -- ASYMPTOMATIC  . Urge urinary incontinence   . Sinus drainage   . Neuromuscular disorder     rt hand numbness  . Dysuria     has urethral bump  . Ureteral stent retained   . Arthritis   . OSA (obstructive sleep apnea)     not using cpap  . Paranoia   . Obesity   . Chest pain     a. Normal stress  test 08/2008 and CTA neg for PE at that time.  Jola Baptist ear dysfunction     a. Prior h/o vertigo.    reports that she has never smoked. She has never used smokeless tobacco. She reports that she does not drink alcohol or use illicit drugs. Family History  Problem Relation Age of Onset  . Other      Pt unaware due to psych condition     Living Arrangements: Alone   Abuse/Neglect [facility]) Physical Abuse: Denies Verbal Abuse: Denies Sexual Abuse: Denies Allergies:   Allergies  Allergen Reactions  . Diphenhydramine Other (See Comments)    UNKNOWN  . Flagyl [Metronidazole] Other (See Comments)    HALLUCINATIONS  . Hydrocodone Other (See Comments)    HALLUCINATIONS  . Meperidine Hcl Nausea And Vomiting  . Penicillins Hives  . Sulfonamide Derivatives Hives     Objective: Blood pressure 141/76, pulse 80, temperature 98.2 F (36.8 C), temperature source Oral, resp. rate 18, height $RemoveBe'5\' 2"'NAosTBAqU$  (1.575 m), weight 96.4 kg (212 lb 8.4 oz), SpO2 97.00%.Body mass index is 38.86 kg/(m^2). Results for orders placed during the hospital encounter of 02/16/14 (from the past 72 hour(s))  CBG MONITORING, ED     Status: Abnormal   Collection Time    02/16/14  8:26 PM      Result Value Ref Range   Glucose-Capillary 108 (*) 70 - 99 mg/dL  URINALYSIS, ROUTINE W REFLEX MICROSCOPIC     Status: Abnormal   Collection Time    02/16/14  8:30 PM      Result Value Ref Range   Color, Urine AMBER (*) YELLOW   Comment: BIOCHEMICALS MAY BE AFFECTED BY COLOR   APPearance CLOUDY (*) CLEAR   Specific Gravity, Urine 1.027  1.005 - 1.030   pH 5.0  5.0 - 8.0   Glucose, UA NEGATIVE  NEGATIVE mg/dL   Hgb urine dipstick SMALL (*) NEGATIVE   Bilirubin Urine SMALL (*) NEGATIVE   Ketones, ur NEGATIVE  NEGATIVE mg/dL   Protein, ur 100 (*) NEGATIVE mg/dL   Urobilinogen, UA 0.2  0.0 - 1.0 mg/dL   Nitrite NEGATIVE  NEGATIVE   Leukocytes, UA TRACE (*) NEGATIVE  URINE RAPID DRUG SCREEN (HOSP PERFORMED)     Status: None    Collection Time    02/16/14  8:30 PM      Result Value Ref Range   Opiates NONE DETECTED  NONE DETECTED   Cocaine NONE DETECTED  NONE DETECTED   Benzodiazepines NONE DETECTED  NONE DETECTED   Amphetamines NONE DETECTED  NONE DETECTED   Tetrahydrocannabinol NONE DETECTED  NONE DETECTED   Barbiturates NONE DETECTED  NONE DETECTED   Comment:  DRUG SCREEN FOR MEDICAL PURPOSES     ONLY.  IF CONFIRMATION IS NEEDED     FOR ANY PURPOSE, NOTIFY LAB     WITHIN 5 DAYS.                LOWEST DETECTABLE LIMITS     FOR URINE DRUG SCREEN     Drug Class       Cutoff (ng/mL)     Amphetamine      1000     Barbiturate      200     Benzodiazepine   662     Tricyclics       947     Opiates          300     Cocaine          300     THC              50  URINE MICROSCOPIC-ADD ON     Status: Abnormal   Collection Time    02/16/14  8:30 PM      Result Value Ref Range   Squamous Epithelial / LPF MANY (*) RARE   WBC, UA 3-6  <3 WBC/hpf   RBC / HPF 3-6  <3 RBC/hpf   Bacteria, UA MANY (*) RARE   Casts HYALINE CASTS (*) NEGATIVE   Urine-Other MUCOUS PRESENT     Comment: FEW YEAST     AMORPHOUS URATES/PHOSPHATES  URINE CULTURE     Status: None   Collection Time    02/16/14  8:30 PM      Result Value Ref Range   Specimen Description URINE, RANDOM     Special Requests NONE     Culture  Setup Time       Value: 02/17/2014 04:29     Performed at [facility] Count       Value: 60,000 COLONIES/ML     Performed at [facility]   Culture       Value: [place]     Performed at [facility]   Report Status PENDING    CBC     Status: None   Collection Time    02/16/14  8:41 PM      Result Value Ref Range   WBC 5.9  4.0 - 10.5 K/uL   RBC 4.65  3.87 - 5.11 MIL/uL   Hemoglobin 14.5  12.0 - 15.0 g/dL   HCT 44.3  36.0 - 46.0 %   MCV 95.3  78.0 - 100.0 fL   MCH 31.2  26.0 - 34.0 pg   MCHC 32.7  30.0 - 36.0 g/dL   RDW 13.5  11.5 - 15.5 %   Platelets  203  150 - 400 K/uL  COMPREHENSIVE METABOLIC PANEL     Status: Abnormal   Collection Time    02/16/14  8:41 PM      Result Value Ref Range   Sodium 146  137 - 147 mEq/L   Potassium 3.8  3.7 - 5.3 mEq/L   Chloride 108  96 - 112 mEq/L   CO2 24  19 - 32 mEq/L   Glucose, Bld 116 (*) 70 - 99 mg/dL   BUN 25 (*) 6 - 23 mg/dL   Creatinine, Ser 0.90  0.50 - 1.10 mg/dL   Calcium 9.8  8.4 - 10.5 mg/dL   Total Protein 7.0  6.0 - 8.3 g/dL   Albumin 3.9  3.5 -  5.2 g/dL   AST 25  0 - 37 U/L   ALT 17  0 - 35 U/L   Alkaline Phosphatase 74  39 - 117 U/L   Total Bilirubin 0.6  0.3 - 1.2 mg/dL   GFR calc non Af Amer 59 (*) >90 mL/min   GFR calc Af Amer 68 (*) >90 mL/min   Comment: (NOTE)     The eGFR has been calculated using the CKD EPI equation.     This calculation has not been validated in all clinical situations.     eGFR's persistently <90 mL/min signify possible Chronic Kidney     Disease.   Anion gap 14  5 - 15  AMMONIA     Status: None   Collection Time    02/16/14  8:53 PM      Result Value Ref Range   Ammonia 31  11 - 60 umol/L  I-STAT TROPOININ, ED     Status: Abnormal   Collection Time    02/17/14 12:20 AM      Result Value Ref Range   Troponin i, poc 0.12 (*) 0.00 - 0.08 ng/mL   Comment NOTIFIED PHYSICIAN     Comment 3            Comment: Due to the release kinetics of cTnI,     a negative result within the first hours     of the onset of symptoms does not rule out     myocardial infarction with certainty.     If myocardial infarction is still suspected,     repeat the test at appropriate intervals.  TSH     Status: None   Collection Time    02/17/14  4:30 AM      Result Value Ref Range   TSH 1.580  0.350 - 4.500 uIU/mL   Comment: Performed at [facility]     Status: None   Collection Time    02/17/14  4:30 AM      Result Value Ref Range   Hemoglobin A1C 5.6  <5.7 %   Comment: (NOTE)                                                                                According to the ADA Clinical Practice Recommendations for 2011, when     HbA1c is used as a screening test:      >=6.5%   Diagnostic of Diabetes Mellitus               (if abnormal result is confirmed)     5.7-6.4%   Increased risk of developing Diabetes Mellitus     References:Diagnosis and Classification of Diabetes Mellitus,Diabetes     IONG,2952,84(XLKGM 1):S62-S69 and Standards of Medical Care in             Diabetes - 2011,Diabetes Care,2011,34 (Suppl 1):S11-S61.   Mean Plasma Glucose 114  <117 mg/dL   Comment: Performed at [place]     Status: Abnormal   Collection Time    02/17/14  4:30 AM      Result Value Ref Range   Sodium 143  137 - 147 mEq/L   Potassium 3.9  3.7 - 5.3 mEq/L   Chloride 107  96 - 112 mEq/L   CO2 22  19 - 32 mEq/L   Glucose, Bld 109 (*) 70 - 99 mg/dL   BUN 23  6 - 23 mg/dL   Creatinine, Ser 0.78  0.50 - 1.10 mg/dL   Calcium 9.5  8.4 - 10.5 mg/dL   Total Protein 6.6  6.0 - 8.3 g/dL   Albumin 3.7  3.5 - 5.2 g/dL   AST 21  0 - 37 U/L   ALT 15  0 - 35 U/L   Alkaline Phosphatase 72  39 - 117 U/L   Total Bilirubin 0.6  0.3 - 1.2 mg/dL   GFR calc non Af Amer 77 (*) >90 mL/min   GFR calc Af Amer 89 (*) >90 mL/min   Comment: (NOTE)     The eGFR has been calculated using the CKD EPI equation.     This calculation has not been validated in all clinical situations.     eGFR's persistently <90 mL/min signify possible Chronic Kidney     Disease.   Anion gap 14  5 - 15  CBC     Status: None   Collection Time    02/17/14  4:30 AM      Result Value Ref Range   WBC 5.9  4.0 - 10.5 K/uL   RBC 4.43  3.87 - 5.11 MIL/uL   Hemoglobin 13.7  12.0 - 15.0 g/dL   HCT 42.6  36.0 - 46.0 %   MCV 96.2  78.0 - 100.0 fL   MCH 30.9  26.0 - 34.0 pg   MCHC 32.2  30.0 - 36.0 g/dL   RDW 13.5  11.5 - 15.5 %   Platelets 199  150 - 400 K/uL  T4, FREE     Status: None   Collection Time    02/17/14  4:30 AM      Result  Value Ref Range   Free T4 1.13  0.80 - 1.80 ng/dL   Comment: Performed at [place], [facility]     Status: Abnormal   Collection Time    02/17/14  7:13 AM      Result Value Ref Range   Glucose-Capillary 102 (*) 70 - 99 mg/dL  TROPONIN I     Status: None   Collection Time    02/17/14  1:39 PM      Result Value Ref Range   Troponin I <0.30  <0.30 ng/mL   Comment:            Due to the release kinetics of cTnI,     a negative result within the first hours     of the onset of symptoms does not rule out     myocardial infarction with certainty.     If myocardial infarction is still suspected,     repeat the test at appropriate intervals.  TROPONIN I     Status: None   Collection Time    02/17/14  6:46 PM      Result Value Ref Range   Troponin I <0.30  <0.30 ng/mL   Comment:            Due to the release kinetics of cTnI,     a negative result within the first hours     of the onset of symptoms does not rule out     myocardial infarction with certainty.  If myocardial infarction is still suspected,     repeat the test at appropriate intervals.  GLUCOSE, CAPILL[facility]     Status: Abnormal   Collection Time    02/17/14  9:24 PM      Result Value Ref Range   Glucose-Capillary 127 (*) 70 - 99 mg/dL  TROPONIN I     Status: None   Collection Time    02/18/14  1:10 AM      Result Value Ref Range   Troponin I <0.30  <0.30 ng/mL   Comment:            Due to the release kinetics of cTnI,     a negative result within the first hours     of the onset of symptoms does not rule out     myocardial infarction with certainty.     If myocardial infarction is still suspected,     repeat the test at appropriate intervals.  GLUCOSE, CAPILLARY     Status: Abnormal   Collection Time    02/18/14  7:53 AM      Result Value Ref Range   Glucose-Capillary 110 (*) 70 - 99 mg/dL   Labs are reviewed and are pertinent for unremarkable- Head CT negative  Current  Facility-Administered Medications  Medication Dose Route Frequency Provider Last Rate Last Dose  . acetaminophen (TYLENOL) tablet 650 mg  650 mg Oral Q6H PRN Allyne Gee, MD   650 mg at 02/17/14 2158   Or  . acetaminophen (TYLENOL) suppository 650 mg  650 mg Rectal Q6H PRN Allyne Gee, MD      . aspirin EC tablet 325 mg  325 mg Oral Daily Allyne Gee, MD   325 mg at 02/18/14 9833  . diltiazem (CARDIZEM) tablet 60 mg  60 mg Oral 4 times per day Angela Adam, RPH   60 mg at 02/18/14 1304  . folic acid (FOLVITE) tablet 1 mg  1 mg Oral Daily Allyne Gee, MD   1 mg at 02/18/14 8250  . heparin injection 5,000 Units  5,000 Units Subcutaneous 3 times per day Allyne Gee, MD   5,000 Units at 02/18/14 1348  . multivitamin with minerals tablet 1 tablet  1 tablet Oral Daily Allyne Gee, MD   1 tablet at 02/18/14 [phone]  . pantoprazole (PROTONIX) EC tablet 40 mg  40 mg Oral Daily Allyne Gee, MD   40 mg at 02/18/14 6734  . QUEtiapine (SEROQUEL) tablet 25 mg  25 mg Oral BID Venetia Maxon Rama, MD   25 mg at 02/18/14 0927  . sodium chloride 0.9 % injection 3 mL  3 mL Intravenous Q12H Allyne Gee, MD   3 mL at 02/18/14 0928  . thiamine (VITAMIN B-1) tablet 100 mg  100 mg Oral Daily Allyne Gee, MD   100 mg at 02/18/14 1937    Psychiatric Specialty Exam:     Blood pressure 141/76, pulse 80, temperature 98.2 F (36.8 C), temperature source Oral, resp. rate 18, height $RemoveBe'5\' 2"'cUimClKkK$  (1.575 m), weight 96.4 kg (212 lb 8.4 oz), SpO2 97.00%.Body mass index is 38.86 kg/(m^2).  General Appearance: Fairly Groomed  Engineer, water::  Good  Speech:  Normal Rate  Volume:  Normal  Mood:  Euthymic  Affect:  Appropriate  Thought Process:  Goal Directed  Orientation:  Full (Time, Place, and Person)- she is oriented to person, place, and date/time  Thought Content:  hallucinations as described above, (+) delusions  as described above   Suicidal Thoughts:  No- denies any suicidal or homicidal ideations  Homicidal  Thoughts:  No  Memory:  recall 3/3 immediate, and 1/3 at 5 minutes with cues, 0/3 without cues  Judgement:  Fair  Insight:  Fair  Psychomotor Activity:  Normal  Concentration:  Good  Recall:  [place] of Knowledge:Good  Language: Good  Akathisia:  Negative  Handed:  Right  AIMS (if indicated):     Assets:  Communication Skills Desire for Improvement Social Support  Sleep:      Musculoskeletal: Strength & Muscle Tone: within normal limits- patient in bed, did not examine gait, but family states she ambulates normally Gait & Station: normal Patient leans: N/A  Treatment Plan Summary: 1. Would consider the possibility of a chronic , relatively low grade psychotic illness, recently worsening in the context of advancing age, possible incipient dementia, and possible medication non compliance. 2. At this time would continue Seroquel 25 mgrs BID, as it is likely that she was not taking medication regularly prior to admission, would not titrate up yet. Of note, antipsychotic use in the elderly is associated with black box warnings of increased risk of mortality, and I have explained this and discussed risk/benefit with patient , daughters 3. Would consider obtaining further tests to determine potentially reversible causes of cognitive decline ( ie: RPR, B12) although low likelihood of these conditions. 4. Family members are very concerned about what course to follow , particularly if patient does not improve, and they would benefit from meeting with Social Worker to discuss possible respite options. 5. Regarding psychiatric admission, there are no grounds for involuntary commitment at this time- would continue treatment in this setting and reassess if needed .    Katurah Karapetian, Felicita Gage 02/18/2014 4:50 PM

## 2014-02-18 NOTE — Progress Notes (Signed)
  Echocardiogram 2D Echocardiogram has been performed.  Monica Strong M 02/18/2014, 12:53 PM

## 2014-02-18 NOTE — Progress Notes (Signed)
  Daily Cardiology Progress Note:  80 y/o F - (only previous cardiac evaluation - Neg Nuc ST in 08/2008) presented to [facility] ER with AMS & Paranoia. Noted to be in Afib RVR in the setting of ?UTI. Seen by Dr. Johnsie Cancel in consultation (Primary Cardiologist = T. Turner, MD) - recommended increasing frequency of Diltiazem (ordered 60 mg qid short acting).  POC Troponin mildly abnormal, but follow-up levels normal - NOT MI.  Subjective:    Objective:  Vital Signs in the last 24 hours: Temp:  [97.5 F (36.4 C)-98.4 F (36.9 C)] 97.5 F (36.4 C) (09/10 0452) Pulse Rate:  [74-106] 102 (09/10 0452) Resp:  [16-18] 18 (09/10 0452) BP: (125-170)/(75-86) 152/75 mmHg (09/10 0455) SpO2:  [96 %-99 %] 99 % (09/10 0452)  Intake/Output from previous day: 09/09 0701 - 09/10 0700 In: 840 [P.O.:840] Out: -  Intake/Output from this shift:    Physical Exam: General appearance: alert, cooperative, appears stated age, no distress, moderately obese and Aware of place, day & month. Neck: no adenopathy, no carotid bruit and no JVD Lungs: clear to auscultation bilaterally and normal percussion bilaterally Heart: irregularly irregular rhythm, S1, S2 normal, systolic murmur: early systolic 2/6, crescendo and decrescendo at 2nd right intercostal space and otherwise no R/G, non-displaced PMI Abdomen: soft, non-tender; bowel sounds normal; no masses,  no organomegaly Extremities: extremities normal, atraumatic, no cyanosis or edema Pulses: 2+ and symmetric Neurologic: Mental status: alertness: alert, orientation: date, person, place, city, affect: normal  Lab Results:  Recent Labs  02/16/14 2041 02/17/14 0430  WBC 5.9 5.9  HGB 14.5 13.7  PLT 203 199    Recent Labs  02/16/14 2041 02/17/14 0430  NA 146 143  K 3.8 3.9  CL 108 107  CO2 24 22  GLUCOSE 116* 109*  BUN 25* 23  CREATININE 0.90 0.78    Recent Labs  02/17/14 1846 02/18/14 0110  TROPONINI <0.30 <0.30   Hepatic Function Panel  Recent  Labs  02/17/14 0430  PROT 6.6  ALBUMIN 3.7  AST 21  ALT 15  ALKPHOS 72  BILITOT 0.6   No results found for this basename: CHOL,  in the last 72 hours No results found for this basename: PROTIME,  in the last 72 hours  Imaging: CXR = no acute process  Cardiac Studies:  Echo: Pending  NO EKG today  TELE: Afib rate down from 100-110 to 90s.  Assessment/Plan:  Principal Problem:   Altered mental status Active Problems:   Atrial fibrillation   Morbid obesity   Paranoia   Troponin level elevated  Afib: Now rate controlled with PO CCB -- rate now controlled.  continue with current dosing & interval (Short Acting Diltiazem 60mg  po tid) , prior to d/c would increase home dosing to Diltiazem CR 90 mg bid.  At this point, would be reluctant to anticoagulate despite CHA2DS2VAsc Score (can defer to Dr. Radford Pax in OP setting -- ? Ability to adhere to a strict regimen & fear falling.  2D Echo pending - but no CHF Sx.  AMS: per TRH & Psych  HTN: stable BP  OSA: Once more stable from Neuropsych standpoint, consider restarting CPAP.   LOS: 2 days    HARDING,DAVID W 02/18/2014, 7:48 AM

## 2014-02-18 NOTE — Progress Notes (Signed)
RT placed patient on CPAP per home regimine of 13 cmH2O. Sterile water added to water chamber for humidification. Patient tolerating well. RT will monitor as needed.

## 2014-02-18 NOTE — Progress Notes (Signed)
Progress Note   Monica Strong OTL:[id number] DOB: 09-22-1931 DOA: 02/16/2014 PCP: Vena Austria, MD   Brief Narrative:   Monica Strong is an 80 y.o. female with a PMH of paranoid psychosis and delusions intermittent over the past 5 years, recently started on Seroquel by PCP who was brought to the hospital 02/16/14 after becoming acutely paranoid and agitated necessitating intervention by the police department. Upon initial evaluation in the ED, the patient was noted to be in atrial fibrillation with heart rate 105-110. Psychiatry consultation is pending.  Assessment/Plan:   Principal Problem:   Atrial fibrillation / troponin level elevated  No prior history of atrophic fibrillation. Initial troponin elevated, no subsequent troponins checked, so will check serial troponins now. Elevated troponin likely from demand ischemia, troponins subsequently cycled every 6 hours x3 sets and were negative.  Monitored on telemetry where she remained in atrial fibrillation.  Given 10 mg of IV Cardizem and placed on 60 mg every 12 hours, subsequently increased to 4 times a day dosing per cardiology recommendations. Heart rate 74-106.  TSH WNL.  CHADS2 = 1 (age > 51), no documented history of diabetes, hypertension, prior TIA/stroke, or heart failure. She has a annual 2.8% chance of stroke, but with current underlying psychiatric problems, there may be issues with compliance. We'll start on aspirin for now, and consider long-term therapeutic anticoagulation once her disposition is known.  2-D echocardiogram pending.  Active Problems:   Altered mental status/Paranoia  The patient has a five-year history of intermittent psychotic events with extreme paranoia including believing that her neighbors are poisoning her and sending her messages from the television. She is currently fairly lucid. PCP recently started her on Seroquel which was effective when she first began this medication, but its  effectiveness has waned over time.  Will ask psychiatry to evaluate her for underlying psychiatric illness. Patient's daughter reports she did have a "nervous breakdown "in the distant past.  While awaiting psychiatry evaluation, increase Seroquel to twice a day dosing.  Continue Air cabin crew.    DVT Prophylaxis  Continue subcutaneous heparin.  Code Status: Full. Family Communication: Daughter at the bedside. Disposition Plan: Home when stable.   IV Access:    Peripheral IV   Procedures and diagnostic studies:   Ct Head Wo Contrast 02/16/2014: No acute abnormality.    Dg Chest Port 1 View 02/17/2014 : No evidence of active pulmonary disease.     Medical Consultants:    Psychiatry  Dr. Jenkins Rouge, Cardiology   Other Consultants:    None.   Anti-Infectives:    None.  Subjective:   Monica Strong continues to deny palpitations or sensation of racing heart. She denies presyncope or current chest pain. Mental status appears stable.  Objective:    Filed Vitals:   02/17/14 1358 02/17/14 2128 02/18/14 0452 02/18/14 0455  BP: 154/78 125/86 170/83 152/75  Pulse: 106 74 102   Temp: 98.4 F (36.9 C) 98 F (36.7 C) 97.5 F (36.4 C)   TempSrc: Oral Oral Oral   Resp: 18 16 18    Height:      Weight:      SpO2: 96% 96% 99%     Intake/Output Summary (Last 24 hours) at 02/18/14 0830 Last data filed at 02/17/14 1700  Gross per 24 hour  Intake    480 ml  Output      0 ml  Net    480 ml    Exam: Gen:  NAD Cardiovascular:  Heart sounds irregularly irregular Respiratory:  Lungs CTAB Gastrointestinal:  Abdomen soft, NT/ND, + BS Extremities:  No C/E/C   Data Reviewed:    Labs: Basic Metabolic Panel:  Recent Labs Lab 02/16/14 2041 02/17/14 0430  NA 146 143  K 3.8 3.9  CL 108 107  CO2 24 22  GLUCOSE 116* 109*  BUN 25* 23  CREATININE 0.90 0.78  CALCIUM 9.8 9.5   GFR Estimated Creatinine Clearance: 60.7 ml/min (by C-G formula based on Cr of  0.78). Liver Function Tests:  Recent Labs Lab 02/16/14 2041 02/17/14 0430  AST 25 21  ALT 17 15  ALKPHOS 74 72  BILITOT 0.6 0.6  PROT 7.0 6.6  ALBUMIN 3.9 3.7    Recent Labs Lab 02/16/14 2053  AMMONIA 31   CBC:  Recent Labs Lab 02/16/14 2041 02/17/14 0430  WBC 5.9 5.9  HGB 14.5 13.7  HCT 44.3 42.6  MCV 95.3 96.2  PLT 203 199   Cardiac Enzymes:  Recent Labs Lab 02/17/14 1339 02/17/14 1846 02/18/14 0110  TROPONINI <0.30 <0.30 <0.30   CBG:  Recent Labs Lab 02/16/14 2026 02/17/14 0713 02/17/14 2124 02/18/14 0753  GLUCAP 108* 102* 127* 110*   Hgb A1c:  Recent Labs  02/17/14 0430  HGBA1C 5.6   Thyroid function studies:  Recent Labs  02/17/14 0430  TSH 1.580   Microbiology Recent Results (from the past 240 hour(s))  URINE CULTURE     Status: None   Collection Time    02/16/14  8:30 PM      Result Value Ref Range Status   Specimen Description URINE, RANDOM   Final   Special Requests NONE   Final   Culture  Setup Time     Final   Value: 02/17/2014 04:29     Performed at [place]     Final   Value: 60,000 COLONIES/ML     Performed at [facility]   Culture     Final   Value: GRAM NEGATIVE RODS     Performed at [facility]   Report Status PENDING   Incomplete     Medications:   . aspirin EC  325 mg Oral Daily  . diltiazem  60 mg Oral 4 times per day  . folic acid  1 mg Oral Daily  . heparin  5,000 Units Subcutaneous 3 times per day  . multivitamin with minerals  1 tablet Oral Daily  . pantoprazole  40 mg Oral Daily  . QUEtiapine  25 mg Oral BID  . sodium chloride  3 mL Intravenous Q12H  . thiamine  100 mg Oral Daily   Continuous Infusions:   Time spent: 25 minutes.   LOS: 2 days   RAMA,CHRISTINA  [facility] Pager [phone]. If unable to reach me by pager, please call my cell phone at [phone].  *Please refer to [email or web address], password [email or web address] to get updated schedule on who will  round on this patient, as hospitalists switch teams weekly. If 7PM-7AM, please contact night-coverage at [email or web address], password [email or web address] for any overnight needs.  02/18/2014, 8:30 AM

## 2014-02-19 LAB — RPR

## 2014-02-19 LAB — URINE CULTURE

## 2014-02-19 LAB — GLUCOSE, CAPILLARY
Glucose-Capillary: 97 mg/dL (ref 70–99)
Glucose-Capillary: 114 mg/dL — ABNORMAL HIGH (ref 70–99)

## 2014-02-19 LAB — VITAMIN B12: VITAMIN B 12: 246 pg/mL (ref 211–911)

## 2014-02-19 MED ORDER — DILTIAZEM HCL ER 90 MG PO CP12
90.0000 mg | ORAL_CAPSULE | Freq: Two times a day (BID) | ORAL | Status: DC
  Administered 2014-02-19 – 2014-02-23 (×9): 90 mg via ORAL
  Filled 2014-02-19 (×13): qty 1

## 2014-02-19 MED ORDER — SODIUM CHLORIDE 0.9 % IV SOLN
500.0000 mg | Freq: Three times a day (TID) | INTRAVENOUS | Status: DC
  Administered 2014-02-19 – 2014-02-23 (×13): 500 mg via INTRAVENOUS
  Filled 2014-02-19 (×14): qty 500

## 2014-02-19 NOTE — Progress Notes (Signed)
ANTIBIOTIC CONSULT NOTE - INITIAL  Pharmacy Consult for Primaxin Indication: ESBL UTI  Allergies  Allergen Reactions  . Diphenhydramine Other (See Comments)    UNKNOWN  . Flagyl [Metronidazole] Other (See Comments)    HALLUCINATIONS  . Hydrocodone Other (See Comments)    HALLUCINATIONS  . Meperidine Hcl Nausea And Vomiting  . Penicillins Hives  . Sulfonamide Derivatives Hives    Patient Measurements: Height: 5\' 2"  (157.5 cm) Weight: 212 lb 8.4 oz (96.4 kg) IBW/kg (Calculated) : 50.1   Vital Signs: Temp: 97.8 F (36.6 C) (09/10 2100) Temp src: Oral (09/10 2100) BP: 116/61 mmHg (09/10 2100) Pulse Rate: 70 (09/10 2100) Intake/Output from previous day: 09/10 0701 - 09/11 0700 In: 480 [P.O.:480] Out: -  Intake/Output from this shift:    Labs:  Recent Labs  02/16/14 2041 02/17/14 0430  WBC 5.9 5.9  HGB 14.5 13.7  PLT 203 199  CREATININE 0.90 0.78   Estimated Creatinine Clearance: 60.7 ml/min (by C-G formula based on Cr of 0.78). No results found for this basename: Letta Median, VANCORANDOM, [place], GENTPEAK, GENTRANDOM, TOBRATROUGH, TOBRAPEAK, TOBRARND, AMIKACINPEAK, AMIKACINTROU, AMIKACIN,  in the last 72 hours   Microbiology: Recent Results (from the past 720 hour(s))  URINE CULTURE     Status: None   Collection Time    02/16/14  8:30 PM      Result Value Ref Range Status   Specimen Description URINE, RANDOM   Final   Special Requests NONE   Final   Culture  Setup Time     Final   Value: 02/17/2014 04:29     Performed at [place]     Final   Value: 60,000 COLONIES/ML     Performed at [facility]   Culture     Final   Value: ESCHERICHIA COLI     Note: Confirmed Extended Spectrum Beta-Lactamase Producer (ESBL)     Note: CRITICAL RESULT CALLED TO, READ BACK BY AND VERIFIED WITH: LEANN REINERT AT 4:27 A.M. ON 02/19/14 WARRB     Performed at [facility]   Report Status 02/19/2014 FINAL   Final   Organism ID, Bacteria ESCHERICHIA COLI   Final    Medical History: Past Medical History  Diagnosis Date  . Renal stones left  . GERD (gastroesophageal reflux disease)   . H/O hiatal hernia   . Pulmonary nodules BENIGN--  MONITORED BY PCP DR READE--  ASYMPTOMATIC     LAST CHEST CT 08-17-2011  . Heart murmur MILD -- ASYMPTOMATIC  . Urge urinary incontinence   . Sinus drainage   . Neuromuscular disorder     rt hand numbness  . Dysuria     has urethral bump  . Ureteral stent retained   . Arthritis   . OSA (obstructive sleep apnea)     not using cpap  . Paranoia   . Obesity   . Chest pain     a. Normal stress test 08/2008 and CTA neg for PE at that time.  [facility] ear dysfunction     a. Prior h/o vertigo.    Medications:  Scheduled:  . aspirin EC  325 mg Oral Daily  . diltiazem  60 mg Oral 4 times per day  . folic acid  1 mg Oral Daily  . heparin  5,000 Units Subcutaneous 3 times per day  . imipenem-cilastatin  500 mg Intravenous 3 times per day  . multivitamin with minerals  1 tablet Oral Daily  .  pantoprazole  40 mg Oral Daily  . QUEtiapine  25 mg Oral BID  . sodium chloride  3 mL Intravenous Q12H  . thiamine  100 mg Oral Daily   Assessment: 54 yoF admitted with mental status changes 9/8 now urine culture positive for ESBL UTI.   Goal of Therapy:  Treat infection  Plan:   Primaxin 500mg  IV q8h  F/U SCr/cultures  Lawana Pai R 02/19/2014,5:22 AM

## 2014-02-19 NOTE — Progress Notes (Signed)
Clinical Social Work Department CLINICAL SOCIAL WORK PSYCHIATRY SERVICE LINE ASSESSMENT 02/19/2014  Patient:  Monica Strong  Account:  [id number]  [place] Date:  02/16/2014  Clinical Social Worker:  Sindy Messing, LCSW  Date/Time:  02/19/2014 08:30 AM Referred by:  Physician  Date referred:  02/19/2014 Reason for Referral  Psychosocial assessment   Presenting Symptoms/Problems (In the person's/family's own words):   Psych consulted due to psychosis.   Abuse/Neglect/Trauma History (check all that apply)  Denies history   Abuse/Neglect/Trauma Comments:   None reported   Psychiatric History (check all that apply)  Outpatient treatment  Inpatient/hospitilization   Psychiatric medications:  Seroquel 25 mg   Current Mental Health Hospitalizations/Previous Mental Health History:   Patient has been diagnosed with psychosis NOS. Patient's PCP prescribes medication but patient is not always compliant with medication. Patient has refused to follow up with a psychiatrist in the past.   Current provider:   Dr. Patrick Jupiter and Date:   [place], [place]   Current Medications:   Scheduled Meds:      . aspirin EC  325 mg Oral Daily  . diltiazem  90 mg Oral Q12H  . folic acid  1 mg Oral Daily  . heparin  5,000 Units Subcutaneous 3 times per day  . imipenem-cilastatin  500 mg Intravenous 3 times per day  . multivitamin with minerals  1 tablet Oral Daily  . pantoprazole  40 mg Oral Daily  . QUEtiapine  25 mg Oral BID  . sodium chloride  3 mL Intravenous Q12H  . thiamine  100 mg Oral Daily        Continuous Infusions:      PRN Meds:.acetaminophen, acetaminophen       Previous Impatient Admission/Date/Reason:   Inpatient psych placement over 40 years ago.   Emotional Health / Current Symptoms    Suicide/Self Harm  None reported   Suicide attempt in the past:   Patient denies any SI or HI.   Other harmful behavior:   None reported   Psychotic/Dissociative Symptoms  Paranoia   Confusion   Other Psychotic/Dissociative Symptoms:   Patient believes that neighbors are trying to harm her and that [employer] lives behind her.    Attention/Behavioral Symptoms  Other - See comment   Other Attention / Behavioral Symptoms:   Patient drowsy and unable to fully participate at this time.    Cognitive Impairment  Unable to accurately assess   Other Cognitive Impairment:   Patient drowsy and unable to fully participate at this time.    Mood and Adjustment  Unable to accurately assess    Stress, Anxiety, Trauma, Any Recent Loss/Stressor  None reported   Anxiety (frequency):   N/A   Phobia (specify):   N/A   Compulsive behavior (specify):   N/A   Obsessive behavior (specify):   N/A   Other:   N/A   Substance Abuse/Use  None   SBIRT completed (please refer for detailed history):  N  Self-reported substance use:   Patient denies substance use.   Urinary Drug Screen Completed:  Y Alcohol level:   Negative screen    Environmental/Housing/Living Arrangement  Stable housing   Who is in the home:   Alone   Emergency contact:  Monica Strong   Financial  Medicare   Patient's Strengths and Goals (patient's own words):   Patient has supportive family.   Clinical Social Worker's Interpretive Summary:   CSW received referral to complete psychosocial assessment. CSW reviewed chart which stated  that psych MD does not feel that patient requires inpatient psych placement and does not meet criteria for IVC.    CSW attempted to meet with patient but patient drowsy and laying in bed. Patient does not open her eyes and will only answer with one word responses. RN reports patient has been drowsy all morning and MD is adjusting medications.    CSW received a call from dtr Monica Strong) who lives in [place]. Dtr reports that she is concerned about patient's wellbeing and spoke with psych MD yesterday. Dtr reports she is understanding of psych MD recommendations but  does feel that patient requires LT placement. CSW spoke with dtr re: Medicare coverage for ST SNF and that patient would need to qualify for stay. Dtr reports they have done some research about ALF but cannot afford $6000 a month for ALF. CSW explained that different facilities have different rates. Dtr reports that patient will not qualify for Medicaid but has never actually applied and has not spoke with Department of Social Services. Dtr reports that her sister that lives locally could complete application. Dtr upset with CSW and reports that if patient cannot be placed LT then they would consider placing patient as a ward of the state and allowing a guardian to be patient's representative so that patient would get placed. CSW explained that the state would still have to evaluate patient to determine needs. Dtr reports she was just upset and that she and sister do want to be involved with care but is frustrated with "the way the [place] handles the elderly." CSW suggested that family use patient's assess such as her house, car, savings, etc. to pay for LT placement at ALF or in-home care. Dtr reports she will talk with sister re: placement.    CSW received a call from dtr Monica Strong) who lives in [place]. Dtr reports she is a Pharmacist, hospital and checks on patient often but cannot be with her 24/7. Dtr understanding of ST SNF if patient qualifies and reports she could research ALF options for LT placement. Dtr agreeable to contact Department of Social Services to discuss Medicaid and reports it would be an option for them to use patient's assess if needed. CSW explained PT is ordered for patient and dtr agreeable to [facility] search if PT recommends SNF. CSW agreeable to gather information on ALF placement as well and plans to meet with dtr at 4pm in patients room.    CSW will continue to follow.   Disposition:  Recommend Psych CSW continuing to support while in hospital  [place], [place] [phone]

## 2014-02-19 NOTE — Progress Notes (Signed)
Progress Note   Monica Strong MEQ:[id number] DOB: [birth year] DOA: 02/16/2014 PCP: Vena Austria, MD   Brief Narrative:   Monica Strong is an 80 y.o. female with a PMH of paranoid psychosis and delusions intermittent over the past 5 years, recently started on Seroquel by PCP who was brought to the hospital 02/16/14 after becoming acutely paranoid and agitated necessitating intervention by the police department. Upon initial evaluation in the ED, the patient was noted to be in atrial fibrillation with heart rate 105-110. Psychiatry consultation is pending.  Assessment/Plan:   Principal Problem:   Atrial fibrillation / troponin level elevated  No prior history of atrophic fibrillation. Initial troponin elevated, subsequent troponins negative. Elevated troponin likely from demand ischemia.  Monitored on telemetry where she has remained in atrial fibrillation.  Continue Cardizem for rate control.  TSH WNL.  CHADS2 = 1 (age > 78), no documented history of diabetes, hypertension, prior TIA/stroke, or heart failure. She has a annual 2.8% chance of stroke, but with current underlying psychiatric problems, there may be issues with compliance. Continue aspirin for now, further outpatient followup with cardiologist to determine if therapeutic anticoagulation is warranted.  2-D echocardiogram shows EF 65-70%. No regional wall motion abnormalities.  Active Problems:   Altered mental status/Paranoia  The patient has a five-year history of intermittent psychotic events with extreme paranoia including believing that her neighbors are poisoning her and sending her messages from the television. She is currently fairly lucid. PCP recently started her on Seroquel which was effective when she first began this medication, but its effectiveness has waned over time.  Seen by psychiatrist 02/18/14. B12/RPR ordered per recommendations.  Continue twice a day Seroquel.  Continue Air cabin crew.   ESBL Escherichia coli UTI  Continue Primaxin.     DVT Prophylaxis  Continue subcutaneous heparin.  Code Status: Full. Family Communication: Daughter at the bedside 02/18/14. Disposition Plan: Home when stable.   IV Access:    Peripheral IV   Procedures and diagnostic studies:   Ct Head Wo Contrast 02/16/2014: No acute abnormality.    Dg Chest Port 1 View 02/17/2014 : No evidence of active pulmonary disease.     Medical Consultants:    Psychiatry  Dr. Jenkins Rouge, Cardiology   Other Consultants:    None.   Anti-Infectives:    Primaxin 02/19/14--->  Subjective:   Monica Strong is a bit more sedated today, continues to deny physical symptoms.  Objective:    Filed Vitals:   02/18/14 1457 02/18/14 2100 02/19/14 0525 02/19/14 0815  BP:  116/61 92/48 138/79  Pulse: 80 70 60   Temp: 98.2 F (36.8 C) 97.8 F (36.6 C) 97.8 F (36.6 C)   TempSrc: Oral Oral Oral   Resp: 18 20 18    Height:      Weight:   98.113 kg (216 lb 4.8 oz)   SpO2: 97% 94% 95%     Intake/Output Summary (Last 24 hours) at 02/19/14 0836 Last data filed at 02/19/14 0600  Gross per 24 hour  Intake    580 ml  Output      0 ml  Net    580 ml    Exam: Gen:  NAD Cardiovascular:  Heart sounds irregularly irregular Respiratory:  Lungs CTAB Gastrointestinal:  Abdomen soft, NT/ND, + BS Extremities:  No C/E/C   Data Reviewed:    Labs: Basic Metabolic Panel:  Recent Labs Lab 02/16/14 2041 02/17/14 0430  NA 146 143  K 3.8 3.9  CL 108 107  CO2 24 22  GLUCOSE 116* 109*  BUN 25* 23  CREATININE 0.90 0.78  CALCIUM 9.8 9.5   GFR Estimated Creatinine Clearance: 61.4 ml/min (by C-G formula based on Cr of 0.78). Liver Function Tests:  Recent Labs Lab 02/16/14 2041 02/17/14 0430  AST 25 21  ALT 17 15  ALKPHOS 74 72  BILITOT 0.6 0.6  PROT 7.0 6.6  ALBUMIN 3.9 3.7    Recent Labs Lab 02/16/14 2053  AMMONIA 31   CBC:  Recent Labs Lab 02/16/14 2041 02/17/14 0430    WBC 5.9 5.9  HGB 14.5 13.7  HCT 44.3 42.6  MCV 95.3 96.2  PLT 203 199   Cardiac Enzymes:  Recent Labs Lab 02/17/14 1339 02/17/14 1846 02/18/14 0110  TROPONINI <0.30 <0.30 <0.30   CBG:  Recent Labs Lab 02/17/14 0713 02/17/14 2124 02/18/14 0753 02/18/14 1710 02/19/14 0733  GLUCAP 102* 127* 110* 130* 97   Hgb A1c:  Recent Labs  02/17/14 0430  HGBA1C 5.6   Thyroid function studies:  Recent Labs  02/17/14 0430  TSH 1.580   Microbiology Recent Results (from the past 240 hour(s))  URINE CULTURE     Status: None   Collection Time    02/16/14  8:30 PM      Result Value Ref Range Status   Specimen Description URINE, RANDOM   Final   Special Requests NONE   Final   Culture  Setup Time     Final   Value: 02/17/2014 04:29     Performed at [place]     Final   Value: 60,000 COLONIES/ML     Performed at [facility]   Culture     Final   Value: ESCHERICHIA COLI     Note: Confirmed Extended Spectrum Beta-Lactamase Producer (ESBL)     Note: CRITICAL RESULT CALLED TO, READ BACK BY AND VERIFIED WITH: LEANN REINERT AT 4:27 A.M. ON 02/19/14 WARRB     Performed at [facility]   Report Status 02/19/2014 FINAL   Final   Organism ID, Bacteria ESCHERICHIA COLI   Final     Medications:   . aspirin EC  325 mg Oral Daily  . diltiazem  90 mg Oral Q12H  . folic acid  1 mg Oral Daily  . heparin  5,000 Units Subcutaneous 3 times per day  . imipenem-cilastatin  500 mg Intravenous 3 times per day  . multivitamin with minerals  1 tablet Oral Daily  . pantoprazole  40 mg Oral Daily  . QUEtiapine  25 mg Oral BID  . sodium chloride  3 mL Intravenous Q12H  . thiamine  100 mg Oral Daily   Continuous Infusions:   Time spent: 25 minutes.   LOS: 3 days   [facility] Pager [phone]. If unable to reach me by pager, please call my cell phone at [phone].  *Please refer to [email or web address], password [email or web address] to get  updated schedule on who will round on this patient, as hospitalists switch teams weekly. If 7PM-7AM, please contact night-coverage at [email or web address], password [email or web address] for any overnight needs.  02/19/2014, 8:36 AM

## 2014-02-19 NOTE — Evaluation (Signed)
Physical Therapy Evaluation Patient Details Name: Monica Strong MRN: [id number] DOB: 01-Mar-1932 Today's Date: 02/19/2014   History of Present Illness  Monica Strong is a 80 y.o. female presents  02/16/14 with being paranoid delusional and confused. Patients daughter brought her in the ED as she had been wandering and was not behaving appropriately. Patient was more confused than usual. Patient apparently barricaded herself in her home. Patients daughter had to call the police. At this time she is obviously confused does not answer appropriately. In the ED she was found to be in atrial fibrillation.  Clinical Impression  Nursing reported that patient had just wandered into hall and was walking , holding onto rail./ At the time PT  Entered room, patient sitting on EOB, CNA with her. Patient  catatonic, minimal verbaliztions and very unsteady to walk in the room with a cane as patient would not use the RW. Pt will benefit from PT to address problems listed in note below.    Follow Up Recommendations SNF;Supervision/Assistance - 24 hour    Equipment Recommendations  None recommended by PT    Recommendations for Other Services       Precautions / Restrictions Precautions Precautions: Fall Restrictions Weight Bearing Restrictions: No      Mobility  Bed Mobility Overal bed mobility: Needs Assistance Bed Mobility: Sit to Supine       Sit to supine: Mod assist   General bed mobility comments: for legs onto bed  Transfers Overall transfer level: Needs assistance Equipment used: Straight cane Transfers: Sit to/from Stand Sit to Stand: Mod assist         General transfer comment: from bed and toilet , tactile cues to use rail beside the toilet.  Ambulation/Gait Ambulation/Gait assistance: Mod assist Ambulation Distance (Feet): 15 Feet (x3) Assistive device: Straight cane Gait Pattern/deviations: Step-to pattern;Decreased step length - right;Decreased step length -  left;Shuffle;Staggering left;Staggering right   Gait velocity interpretation: <1.8 ft/sec, indicative of risk for recurrent falls General Gait Details: pt is very slow to initiate  activity.  Stairs            Wheelchair Mobility    Modified Rankin (Stroke Patients Only)       Balance Overall balance assessment: Needs assistance;History of Falls Sitting-balance support: Feet supported;No upper extremity supported Sitting balance-Leahy Scale: Poor Sitting balance - Comments: listing to sides, ? due to meds, required steady assist to  help patient with pericare after voiding./   Standing balance support: During functional activity;Single extremity supported Standing balance-Leahy Scale: Poor                               Pertinent Vitals/Pain Pain Assessment: Faces Faces Pain Scale: Hurts little more Pain Location: rectum, pt.  feels she is impacted when questioning Pain Descriptors / Indicators: [place] expects to be discharged to:: Private residence Living Arrangements: Alone             Home Equipment: [name] - single point Additional Comments: no family present for info and patient is unable to provide. Patient did state she still drives. ? accuracy. pt is very flat and catatonic.    Prior Function           Comments: info not  known, reportedly lives alone     Hand Dominance        Extremity/Trunk Assessment   Upper Extremity Assessment: Generalized weakness  Lower Extremity Assessment: Generalized weakness      Cervical / Trunk Assessment: Kyphotic  Communication      Cognition Arousal/Alertness: Lethargic Behavior During Therapy:  (catatonic-like,) Overall Cognitive Status: No family/caregiver present to determine baseline cognitive functioning Area of Impairment: Orientation;Attention;Following commands;Safety/judgement;Awareness;Problem solving       Following Commands:  Follows one step commands inconsistently     Problem Solving: Slow processing General Comments: pt is very slow to respond, no eye contact. did not respond to orientation Q's except "hospital" response.    General Comments      Exercises        Assessment/Plan    PT Assessment Patient needs continued PT services  PT Diagnosis Difficulty walking;Generalized weakness;Altered mental status   PT Problem List Decreased strength;Decreased activity tolerance;Decreased balance;Decreased mobility;Decreased safety awareness;Decreased knowledge of precautions;Decreased knowledge of use of DME;Decreased cognition  PT Treatment Interventions DME instruction;Gait training;Functional mobility training;Therapeutic activities;Therapeutic exercise;Patient/family education   PT Goals (Current goals can be found in the Care Plan section) Acute Rehab PT Goals Patient Stated Goal: Patient unable to state except that she needed the bathroom after sitting on bed after her short walk. PT Goal Formulation: Patient unable to participate in goal setting Time For Goal Achievement: 03/05/14 Potential to Achieve Goals: Good    Frequency Min 3X/week   Barriers to discharge Decreased caregiver support      Co-evaluation               End of Session Equipment Utilized During Treatment: Gait belt Activity Tolerance: Patient limited by fatigue Patient left: in bed;with call bell/phone within reach;with bed alarm set Nurse Communication: Mobility status         Time: 1037-1100 PT Time Calculation (min): 23 min   Charges:   PT Evaluation $Initial PT Evaluation Tier I: 1 Procedure PT Treatments $Gait Training: 8-22 mins $Self Care/Home Management: 8-22   PT G Codes:          Claretha Cooper 02/19/2014, 12:14 PM Tresa Endo PT [phone]

## 2014-02-19 NOTE — Progress Notes (Signed)
Daily Cardiology Progress Note:  80 y/o F - (only previous cardiac evaluation - Neg Nuc ST in 08/2008) presented to [facility] ER with AMS & Paranoia. Noted to be in Afib RVR in the setting of ?UTI. Seen by Dr. Johnsie Cancel in consultation (Primary Cardiologist = T. Turner, MD) - recommended increasing frequency of Diltiazem (ordered 60 mg qid short acting).  POC Troponin mildly abnormal, but follow-up levels normal - NOT MI.  Subjective:  Feels better, seems more alert - but resting soundly upon my arrival.. Seen by Psychiatry yesterday.  Objective:  Vital Signs in the last 24 hours: Temp:  [97.8 F (36.6 C)-98.2 F (36.8 C)] 97.8 F (36.6 C) (09/11 0525) Pulse Rate:  [60-85] 60 (09/11 0525) Resp:  [18-20] 18 (09/11 0525) BP: (92-141)/(48-76) 92/48 mmHg (09/11 0525) SpO2:  [94 %-97 %] 95 % (09/11 0525) Weight:  [216 lb 4.8 oz (98.113 kg)] 216 lb 4.8 oz (98.113 kg) (09/11 0525)  Intake/Output from previous day: 09/10 0701 - 09/11 0700 In: 580 [P.O.:480; IV Piggyback:100] Out: -  Intake/Output from this shift:    Physical Exam: General appearance: alert, cooperative, appears stated age,NAD. Moderately obese. Neck: no adenopathy, no carotid bruit and no JVD Lungs: clear to auscultation bilaterally and normal percussion bilaterally Heart: irregularly irregular rhythm, S1, S2 normal, systolic murmur: early systolic 2/6, crescendo and decrescendo at 2nd right intercostal space and otherwise no R/G, non-displaced PMI Abdomen: soft, non-tender; bowel sounds normal; no masses,  no organomegaly Extremities: extremities normal, atraumatic, no cyanosis or edema Pulses: 2+ and symmetric Neurologic: Mental status: alertness: alert, orientation: date, person, place, city, affect: normal  Lab Results:  Recent Labs  02/16/14 2041 02/17/14 0430  WBC 5.9 5.9  HGB 14.5 13.7  PLT 203 199    Recent Labs  02/16/14 2041 02/17/14 0430  NA 146 143  K 3.8 3.9  CL 108 107  CO2 24 22  GLUCOSE 116*  109*  BUN 25* 23  CREATININE 0.90 0.78    Recent Labs  02/17/14 1846 02/18/14 0110  TROPONINI <0.30 <0.30   Hepatic Function Panel  Recent Labs  02/17/14 0430  PROT 6.6  ALBUMIN 3.7  AST 21  ALT 15  ALKPHOS 72  BILITOT 0.6   No results found for this basename: CHOL,  in the last 72 hours No results found for this basename: PROTIME,  in the last 72 hours  Imaging: CXR = no acute process  Cardiac Studies:  Echo: Normal LV size & thickness (~ mild basal septal hypertrophy); EF ~65-70% with normal wall motion, Severe LA dilation  NO EKG today  TELE: Afib rate down to 60s   Assessment/Plan:  Principal Problem:   Altered mental status Active Problems:   Atrial fibrillation   Morbid obesity   Paranoia   Troponin level elevated  Afib: Now rate controlled with PO CCB -- rate now controlled. (wth severe LA dilation, I doubt that Afib is truly a "new" finding).  With adequate rate control - will convert to Diltiazem CR (12 hr tab) 90 mg bid. -- this is a slightly lower dose to avoid hypotension & bradycardia.  At this point, would be reluctant to anticoagulate despite CHA2DS2VAsc Score (can defer to Dr. Radford Pax in OP setting -- ? Ability to adhere to a strict regimen & fear falling & underlying psychiatric concerns.  AMS: per TRH & Psych  HTN: stable BP  OSA: Once more stable from Neuropsych standpoint, consider restarting CPAP.  As Afib rate is adequately controlled, no  active cardiac issues remain.  We will be available for assistance & will assist with arranging OP f/u with Dr. Radford Pax, but will sign off for now.  Please do not hesitate to contact [facility] with any potential ?s/concerns.   LOS: 3 days    HARDING,DAVID W 02/19/2014, 7:29 AM

## 2014-02-19 NOTE — Progress Notes (Signed)
Clinical Social Work  CSW met with patient's dtr and dtr from [place] called in via phone. CSW explained SNF offers and family agreeable to [facility]. Patient has PASRR and [facility] is agreeable to accept if stable over the weekend. CSW explained that patient could go to [place] [facility] for rehab and family should start searching for ALF during this time. CSW provided ALF and had called several facilities re: prices. Dtr agreeable to research options and feel that she and sister could supplement ALF depending on final prices. Dtr asked CSW to call [facility] to explain patient's case to fax information. CSW faxed information to [facility] and spoke with Larene Beach who will review information and follow up with dtr. Dtr understanding if [facility] cannot accept then she should contact other facilities on list for placement. Dtrs thanked CSW for information and agreeable to plans. Dtrs report they feel relieved knowing that that they have a plan they can work on and thanked CSW for assistance.  CSW will continue to follow and made weekend CSW aware of possible DC.  [place], [place] [phone]

## 2014-02-19 NOTE — Progress Notes (Signed)
Central Tele alerted this RN to a 2.15 second pause. Pt HR currently Afib in 50s with occasional drops to upper 30s. Pt refuses CPAP.

## 2014-02-19 NOTE — Progress Notes (Addendum)
Clinical Social Work Department CLINICAL SOCIAL WORK PLACEMENT NOTE 02/19/2014  Patient:  Monica Strong, Monica Strong  Account Number:  [id number] Admit date:  02/16/2014  Clinical Social Worker:  Sindy Messing, LCSW  Date/time:  02/19/2014 08:30 AM  Clinical Social Work is seeking post-discharge placement for this patient at the following level of care:   [place]   (*CSW will update this form in Epic as items are completed)   02/19/2014  Patient/family provided with [place] Department of Clinical Social Work's list of facilities offering this level of care within the geographic area requested by the patient (or if unable, by the patient's family).  02/19/2014  Patient/family informed of their freedom to choose among providers that offer the needed level of care, that participate in Medicare, Medicaid or managed care program needed by the patient, have an available bed and are willing to accept the patient.  02/19/2014  Patient/family informed of [facility]' ownership interest in [facility], as well as of the fact that they are under no obligation to receive care at this facility.  PASARR submitted to EDS on 02/19/2014 PASARR number received on 02/19/2014  FL2 transmitted to all facilities in geographic area requested by pt/family on  02/19/2014 FL2 transmitted to all facilities within larger geographic area on   Patient informed that his/her managed care company has contracts with or will negotiate with  certain facilities, including the following:     Patient/family informed of bed offers received:  02/19/2014 Patient chooses bed at [facility] Physician recommends and patient chooses bed at    Patient to be transferred to [facility] on  02/23/14 Patient to be transferred to facility by [facility] Patient and family notified of transfer on 02/23/14 Name of family member notified:  Dtr-[name]  The following physician request were entered in  Epic:   Additional Comments:

## 2014-02-19 NOTE — Progress Notes (Signed)
[facility] lab called to report that patient has 60.000 colonies of E. Coli in her urine and is positive for ESBL. Will page hospitalist.  Will continue to monitor.

## 2014-02-20 LAB — GLUCOSE, CAPILLARY
Glucose-Capillary: 121 mg/dL — ABNORMAL HIGH (ref 70–99)
Glucose-Capillary: 112 mg/dL — ABNORMAL HIGH (ref 70–99)

## 2014-02-20 MED ORDER — PRAMOXINE-ZINC OXIDE IN MO 1-12.5 % RE OINT
TOPICAL_OINTMENT | Freq: Three times a day (TID) | RECTAL | Status: DC
  Administered 2014-02-20 – 2014-02-21 (×4): via RECTAL
  Administered 2014-02-22: 1 via RECTAL
  Administered 2014-02-22: 16:00:00 via RECTAL
  Filled 2014-02-20: qty 28.3

## 2014-02-20 MED ORDER — DOCUSATE SODIUM 100 MG PO CAPS
100.0000 mg | ORAL_CAPSULE | Freq: Two times a day (BID) | ORAL | Status: DC | PRN
  Administered 2014-02-20: 100 mg via ORAL
  Filled 2014-02-20: qty 1

## 2014-02-20 NOTE — Progress Notes (Signed)
RT placed patient on home CPAP with home settings of 13cmH20. Patient tolerating well.

## 2014-02-20 NOTE — Progress Notes (Signed)
Progress Note   Monica Strong KDX:[id number] DOB: 1932/04/20 DOA: 02/16/2014 PCP: Vena Austria, MD   Brief Narrative:   Monica Strong is an 80 y.o. female with a PMH of paranoid psychosis and delusions intermittent over the past 5 years, recently started on Seroquel by PCP who was brought to the hospital 02/16/14 after becoming acutely paranoid and agitated necessitating intervention by the police department. Upon initial evaluation in the ED, the patient was noted to be in atrial fibrillation with heart rate 105-110. Psychiatry consultation is pending.  Assessment/Plan:   Principal Problem:   Atrial fibrillation / troponin level elevated  No prior history of atrophic fibrillation. Initial troponin elevated, subsequent troponins negative. Elevated troponin likely from demand ischemia.  Monitored on telemetry where she has remained in atrial fibrillation.  Continue Cardizem for rate control.  TSH WNL.  CHADS2 = 1 (age > 26), no documented history of diabetes, hypertension, prior TIA/stroke, or heart failure. She has a annual 2.8% chance of stroke, but with current underlying psychiatric problems, there may be issues with compliance. Continue aspirin for now, further outpatient followup with cardiologist to determine if therapeutic anticoagulation is warranted.  2-D echocardiogram shows EF 65-70%. No regional wall motion abnormalities.  Active Problems:   OSA  Continue nocturnal CPAP.    Altered mental status/Paranoia  The patient has a five-year history of intermittent psychotic events with extreme paranoia including believing that her neighbors are poisoning her and sending her messages from the television. She is currently fairly lucid. PCP recently started her on Seroquel which was effective when she first began this medication, but its effectiveness has waned over time.  Seen by psychiatrist 02/18/14. B12/RPR (B12 WNL, RPR non-reactive) ordered per  recommendations.  Continue twice a day Seroquel.   ESBL Escherichia coli UTI  Continue Primaxin.     DVT Prophylaxis  Continue subcutaneous heparin.  Code Status: Full. Family Communication: Daughter, Shirlean Mylar at the bedside 02/18/14.  Called at [phone], message left. Disposition Plan: SNF when stable.   IV Access:    Peripheral IV   Procedures and diagnostic studies:   Ct Head Wo Contrast 02/16/2014: No acute abnormality.    Dg Chest Port 1 View 02/17/2014 : No evidence of active pulmonary disease.     Medical Consultants:    Dr. Neita Garnet, Psychiatry  Dr. Jenkins Rouge, Cardiology   Other Consultants:    None.   Anti-Infectives:    Primaxin 02/19/14--->  Subjective:   Monica Strong is denies any physical symptoms.  Awake, alert, affect normal.  No evidence of paranoia.  Eating well.  Objective:    Filed Vitals:   02/19/14 0815 02/19/14 1525 02/19/14 2100 02/20/14 0439  BP: 138/79 124/75 138/98 128/94  Pulse:  64 81 113  Temp:  97.4 F (36.3 C) 98.9 F (37.2 C) 97.8 F (36.6 C)  TempSrc:  Axillary Oral Oral  Resp:  16 22 20   Height:      Weight:      SpO2:  98% 95% 96%    Intake/Output Summary (Last 24 hours) at 02/20/14 0843 Last data filed at 02/20/14 0300  Gross per 24 hour  Intake    183 ml  Output      0 ml  Net    183 ml    Exam: Gen:  NAD Cardiovascular:  Heart sounds irregularly irregular Respiratory:  Lungs CTAB Gastrointestinal:  Abdomen soft, NT/ND, + BS Extremities:  No C/E/C   Data Reviewed:  Labs: Basic Metabolic Panel:  Recent Labs Lab 02/16/14 2041 02/17/14 0430  NA 146 143  K 3.8 3.9  CL 108 107  CO2 24 22  GLUCOSE 116* 109*  BUN 25* 23  CREATININE 0.90 0.78  CALCIUM 9.8 9.5   GFR Estimated Creatinine Clearance: 61.4 ml/min (by C-G formula based on Cr of 0.78). Liver Function Tests:  Recent Labs Lab 02/16/14 2041 02/17/14 0430  AST 25 21  ALT 17 15  ALKPHOS 74 72  BILITOT 0.6 0.6   PROT 7.0 6.6  ALBUMIN 3.9 3.7    Recent Labs Lab 02/16/14 2053  AMMONIA 31   CBC:  Recent Labs Lab 02/16/14 2041 02/17/14 0430  WBC 5.9 5.9  HGB 14.5 13.7  HCT 44.3 42.6  MCV 95.3 96.2  PLT 203 199   Cardiac Enzymes:  Recent Labs Lab 02/17/14 1339 02/17/14 1846 02/18/14 0110  TROPONINI <0.30 <0.30 <0.30   CBG:  Recent Labs Lab 02/18/14 0753 02/18/14 1710 02/19/14 0733 02/19/14 2138 02/20/14 0802  GLUCAP 110* 130* 97 114* 121*   Hgb A1c: No results found for this basename: HGBA1C,  in the last 72 hours Thyroid function studies: No results found for this basename: TSH, T4TOTAL, FREET3, T3FREE, THYROIDAB,  in the last 72 hours Microbiology Recent Results (from the past 240 hour(s))  URINE CULTURE     Status: None   Collection Time    02/16/14  8:30 PM      Result Value Ref Range Status   Specimen Description URINE, RANDOM   Final   Special Requests NONE   Final   Culture  Setup Time     Final   Value: 02/17/2014 04:29     Performed at [facility]     Final   Value: 60,000 COLONIES/ML     Performed at [facility]   Culture     Final   Value: ESCHERICHIA COLI     Note: Confirmed Extended Spectrum Beta-Lactamase Producer (ESBL)     Note: CRITICAL RESULT CALLED TO, READ BACK BY AND VERIFIED WITH: LEANN REINERT AT 4:27 A.M. ON 02/19/14 WARRB     Performed at [facility]   Report Status 02/19/2014 FINAL   Final   Organism ID, Bacteria ESCHERICHIA COLI   Final     Medications:   . aspirin EC  325 mg Oral Daily  . diltiazem  90 mg Oral Q12H  . folic acid  1 mg Oral Daily  . heparin  5,000 Units Subcutaneous 3 times per day  . imipenem-cilastatin  500 mg Intravenous 3 times per day  . multivitamin with minerals  1 tablet Oral Daily  . pantoprazole  40 mg Oral Daily  . QUEtiapine  25 mg Oral BID  . sodium chloride  3 mL Intravenous Q12H  . thiamine  100 mg Oral Daily   Continuous Infusions:   Time spent:  25 minutes.   LOS: 4 days   [facility] Pager [phone]. If unable to reach me by pager, please call my cell phone at [phone].  *Please refer to [email or web address], password [email or web address] to get updated schedule on who will round on this patient, as hospitalists switch teams weekly. If 7PM-7AM, please contact night-coverage at [email or web address], password [email or web address] for any overnight needs.  02/20/2014, 8:43 AM

## 2014-02-21 ENCOUNTER — Inpatient Hospital Stay (HOSPITAL_COMMUNITY): Payer: Medicare Other

## 2014-02-21 DIAGNOSIS — G4733 Obstructive sleep apnea (adult) (pediatric): Secondary | ICD-10-CM

## 2014-02-21 LAB — BASIC METABOLIC PANEL
Anion gap: 13 (ref 5–15)
BUN: 18 mg/dL (ref 6–23)
CHLORIDE: 107 meq/L (ref 96–112)
CO2: 24 mEq/L (ref 19–32)
Calcium: 9.6 mg/dL (ref 8.4–10.5)
Creatinine, Ser: 0.87 mg/dL (ref 0.50–1.10)
GFR calc Af Amer: 71 mL/min — ABNORMAL LOW (ref >90)
GFR calc non Af Amer: 61 mL/min — ABNORMAL LOW (ref >90)
GLUCOSE: 107 mg/dL — AB (ref 70–99)
POTASSIUM: 3.8 meq/L (ref 3.7–5.3)
SODIUM: 144 meq/L (ref 137–147)

## 2014-02-21 LAB — CBC
HEMATOCRIT: 41.9 % (ref 36.0–46.0)
HEMOGLOBIN: 13.5 g/dL (ref 12.0–15.0)
MCH: 31 pg (ref 26.0–34.0)
MCHC: 32.2 g/dL (ref 30.0–36.0)
MCV: 96.3 fL (ref 78.0–100.0)
Platelets: 161 10*3/uL (ref 150–400)
RBC: 4.35 MIL/uL (ref 3.87–5.11)
RDW: 13.9 % (ref 11.5–15.5)
WBC: 5.8 10*3/uL (ref 4.0–10.5)

## 2014-02-21 LAB — GLUCOSE, CAPILLARY
GLUCOSE-CAPILLARY: 99 mg/dL (ref 70–99)
Glucose-Capillary: 97 mg/dL (ref 70–99)

## 2014-02-21 MED ORDER — QUETIAPINE FUMARATE 50 MG PO TABS
50.0000 mg | ORAL_TABLET | Freq: Every day | ORAL | Status: DC
  Administered 2014-02-21 – 2014-02-22 (×2): 50 mg via ORAL
  Filled 2014-02-21 (×2): qty 1

## 2014-02-21 NOTE — Progress Notes (Signed)
Progress Note   Monica Strong:[id number] DOB: [birth year] DOA: 02/16/2014 PCP: Vena Austria, MD   Brief Narrative:   Monica Strong is an 80 y.o. female with a PMH of paranoid psychosis and delusions intermittent over the past 5 years, recently started on Seroquel by PCP who was brought to the hospital 02/16/14 after becoming acutely paranoid and agitated necessitating intervention by the police department. Upon initial evaluation in the ED, the patient was noted to be in atrial fibrillation with heart rate 105-110. Psychiatry consultation is pending.  Assessment/Plan:   Principal Problem:   Atrial fibrillation / troponin level elevated  No prior history of atrophic fibrillation. Initial troponin elevated, subsequent troponins negative. Elevated troponin likely from demand ischemia.  Monitored on telemetry where she has remained in atrial fibrillation.  Continue Cardizem for rate control.  TSH WNL.  CHADS2 = 1 (age > 79), no documented history of diabetes, hypertension, prior TIA/stroke, or heart failure. She has a annual 2.8% chance of stroke, but with current underlying psychiatric problems, there may be issues with compliance. Continue aspirin for now, further outpatient followup with cardiologist to determine if therapeutic anticoagulation is warranted.  2-D echocardiogram shows EF 65-70%. No regional wall motion abnormalities.  Active Problems:   OSA  Continue nocturnal CPAP.    Altered mental status/Paranoia  The patient has a five-year history of intermittent psychotic events with extreme paranoia including believing that her neighbors are poisoning her and sending her messages from the television. She is currently fairly lucid. PCP recently started her on Seroquel which was effective when she first began this medication, but its effectiveness has waned over time.  Seen by psychiatrist 02/18/14. B12/RPR (B12 WNL, RPR non-reactive) ordered per  recommendations.  Change Seroquel from 25 mg twice a day to 50 mg each bedtime due to daytime sleepiness.  Monitor in the hospital another 24 hours to evaluate if further psychiatric evaluation needed for ongoing hallucinations.   ESBL Escherichia coli UTI  Continue Primaxin.     DVT Prophylaxis  Continue subcutaneous heparin.  Code Status: Full. Family Communication: Daughter, Shirlean Mylar at the bedside 02/18/14.  Shirlean Mylar can be reached at [phone] (home) or [phone] (cell), updated by telephone today. Disposition Plan: SNF when stable.   IV Access:    Peripheral IV   Procedures and diagnostic studies:   Ct Head Wo Contrast 02/16/2014: No acute abnormality.    Dg Chest Port 1 View 02/17/2014 : No evidence of active pulmonary disease.     Ct Head Wo Contrast 02/21/2014: *No acute intracranial abnormalities. *The appearance of the brain is normal.    Medical Consultants:    Dr. Neita Garnet, Psychiatry  Dr. Jenkins Rouge, Cardiology   Other Consultants:    None.   Anti-Infectives:    Primaxin 02/19/14--->  Subjective:   Monica Strong had decreased responsiveness in the night prompting her RN to call the cross covering M.D. CT scan was done of her head, which was normal. The patient has elaborate delusions/visual hallucinations of being asked to act in a play with her nephews. When asked if she thought she just might have had a vivid dream, patient responded "it didn't feel like a dream ". She has had daytime sleepiness.  Objective:    Filed Vitals:   02/20/14 1518 02/20/14 2034 02/21/14 0339 02/21/14 0439  BP: 118/68 145/87 146/68 139/76  Pulse: 74 73 66 70  Temp: 98.8 F (37.1 C) 97.5 F (36.4 C) 97.9 F (36.6 C) 98.2  F (36.8 C)  TempSrc: Oral Oral Oral Oral  Resp: 18 18 20 18   Height:      Weight:    96.48 kg (212 lb 11.2 oz)  SpO2: 97% 97% 98% 97%    Intake/Output Summary (Last 24 hours) at 02/21/14 0824 Last data filed at 02/21/14 0028  Gross per 24  hour  Intake    660 ml  Output      0 ml  Net    660 ml    Exam: Gen:  NAD Cardiovascular:  Heart sounds irregularly irregular Respiratory:  Lungs CTAB Gastrointestinal:  Abdomen soft, NT/ND, + BS Extremities:  No C/E/C   Data Reviewed:    Labs: Basic Metabolic Panel:  Recent Labs Lab 02/16/14 2041 02/17/14 0430 02/21/14 0555  NA 146 143 144  K 3.8 3.9 3.8  CL 108 107 107  CO2 24 22 24   GLUCOSE 116* 109* 107*  BUN 25* 23 18  CREATININE 0.90 0.78 0.87  CALCIUM 9.8 9.5 9.6   GFR Estimated Creatinine Clearance: 55.9 ml/min (by C-G formula based on Cr of 0.87). Liver Function Tests:  Recent Labs Lab 02/16/14 2041 02/17/14 0430  AST 25 21  ALT 17 15  ALKPHOS 74 72  BILITOT 0.6 0.6  PROT 7.0 6.6  ALBUMIN 3.9 3.7    Recent Labs Lab 02/16/14 2053  AMMONIA 31   CBC:  Recent Labs Lab 02/16/14 2041 02/17/14 0430 02/21/14 0555  WBC 5.9 5.9 5.8  HGB 14.5 13.7 13.5  HCT 44.3 42.6 41.9  MCV 95.3 96.2 96.3  PLT 203 199 161   Cardiac Enzymes:  Recent Labs Lab 02/17/14 1339 02/17/14 1846 02/18/14 0110  TROPONINI <0.30 <0.30 <0.30   CBG:  Recent Labs Lab 02/19/14 2138 02/20/14 0802 02/20/14 1202 02/21/14 0354 02/21/14 0721  GLUCAP 114* 121* 112* 97 99   Microbiology Recent Results (from the past 240 hour(s))  URINE CULTURE     Status: None   Collection Time    02/16/14  8:30 PM      Result Value Ref Range Status   Specimen Description URINE, RANDOM   Final   Special Requests NONE   Final   Culture  Setup Time     Final   Value: 02/17/2014 04:29     Performed at [place]     Final   Value: 60,000 COLONIES/ML     Performed at [facility]   Culture     Final   Value: ESCHERICHIA COLI     Note: Confirmed Extended Spectrum Beta-Lactamase Producer (ESBL)     Note: CRITICAL RESULT CALLED TO, READ BACK BY AND VERIFIED WITH: LEANN REINERT AT 4:27 A.M. ON 02/19/14 WARRB     Performed at [facility]   Report Status 02/19/2014 FINAL   Final   Organism ID, Bacteria ESCHERICHIA COLI   Final     Medications:   . aspirin EC  325 mg Oral Daily  . diltiazem  90 mg Oral Q12H  . folic acid  1 mg Oral Daily  . heparin  5,000 Units Subcutaneous 3 times per day  . imipenem-cilastatin  500 mg Intravenous 3 times per day  . multivitamin with minerals  1 tablet Oral Daily  . pantoprazole  40 mg Oral Daily  . pramoxine-mineral oil-zinc   Rectal TID  . QUEtiapine  50 mg Oral QHS  . sodium chloride  3 mL Intravenous Q12H  . thiamine  100 mg  Oral Daily   Continuous Infusions:   Time spent: 25 minutes.   LOS: 5 days   [facility] Pager [phone]. If unable to reach me by pager, please call my cell phone at [phone].  *Please refer to [email or web address], password [email or web address] to get updated schedule on who will round on this patient, as hospitalists switch teams weekly. If 7PM-7AM, please contact night-coverage at [email or web address], password [email or web address] for any overnight needs.  02/21/2014, 8:24 AM

## 2014-02-21 NOTE — Progress Notes (Signed)
CSW discussed patient with Dr. Rockne Menghini. She stated that patient is not yet stable for d/c to the SNF; she wants Psychiatrist to assess patient again as she is having delusional thoughts again today.  Patient had an episode overnight per nursing where she was less responsive but is more alert. CSW services to monitor and assist patient with placement at [facility] when medically stable per MD. Butch Penny T. Pauline Good, [place]

## 2014-02-21 NOTE — Progress Notes (Signed)
Pt less responsive than beginning of shift. Able to follow commands to some degree. Not speaking when questions asked. Not able to fully grip hands, pupils equal and reactive to light.VSS, CBG 97 M. Donnal Debar, NP notified. New order given for CT scan of head.  Will continue to monitor. Wendee Beavers [place]

## 2014-02-21 NOTE — Progress Notes (Signed)
Pt stated that she didn't want to wear her CPAP tonight.  RT to monitor and assess as needed.

## 2014-02-22 DIAGNOSIS — R6889 Other general symptoms and signs: Secondary | ICD-10-CM

## 2014-02-22 DIAGNOSIS — F29 Unspecified psychosis not due to a substance or known physiological condition: Secondary | ICD-10-CM | POA: Diagnosis present

## 2014-02-22 LAB — GLUCOSE, CAPILLARY: GLUCOSE-CAPILLARY: 89 mg/dL (ref 70–99)

## 2014-02-22 MED ORDER — LORAZEPAM 2 MG/ML IJ SOLN
0.5000 mg | Freq: Once | INTRAMUSCULAR | Status: AC
  Administered 2014-02-22: 0.5 mg via INTRAVENOUS
  Filled 2014-02-22: qty 1

## 2014-02-22 MED ORDER — QUETIAPINE FUMARATE 50 MG PO TABS
75.0000 mg | ORAL_TABLET | Freq: Every day | ORAL | Status: DC
  Filled 2014-02-22: qty 1

## 2014-02-22 NOTE — Progress Notes (Signed)
Pt refuses to qhs CPAP.  Complains of sinus pain and thinks it will be "too much."

## 2014-02-22 NOTE — Progress Notes (Addendum)
Pt called out to go to bathroom. She was very spaced out it felt like she was looking straight though [place]. Would not acknowledge the tech or nurse. We called her name she just ignored and was talking non sense. We got her back in the bed. Then she started talking about Erick Alley. She had a bible verse wrote on a piece of paper and she read the verse and then asked about his vows and if he took them serious.  I asked him who he was and she would not answer me. I asked her if Octavia Bruckner was in the room and to point him out but she said he had left about 2 minutes ago. During this time she is trying to fix the bed and the covers had to be straight. Then she told me to take the cane and put it between her legs and make a cross with the comb on her side table. She then sit straight up in the bed and yelled something (nonsense) and lay back down. She told me that there were witches and harlots. I asked her if she believed in them and she said they were in the house, if not in house they were below [place]. I asked her who Octavia Bruckner was and she wouldn't tell me other than she said "do i look like someone who is married to a Tim." I asked her if she was scared and she said no and told me to "Fear Not." She then pulled the covers over her head and went back to talking non sense about Tim and all the mean things he has done. 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 She said that Octavia Bruckner was going to [place] for all the hurt and stuff he has done to her family. Tim would never commit sodomy anymore to his son. And wouldn't molest Joy. She said that he wouldn't hit [name] or her again. During this time, I asked her where she was and she knew [facility].   0318-Pt swinging cane in air while laying in bed and not responding to RN's voice. She laid the cane down beside her and turned over.   Paged NP at [facility]

## 2014-02-22 NOTE — Consult Note (Signed)
  Patient known to me from recent consultation. Duration of visit 25 minutes. Asked to reassess patient.  Patient states she is doing OK and currently not endorsing any new symptoms or concerns.  MSE- Patient  Is alert and attentive, well related, pleasant, cooperative, and remembers me from recent  Meeting. She denies depression, does describe some emotional lability, which she attributes to recently getting message that her ex husband had finally apologized for being a bad husband in the past. She denies, however, any depression, and appears euthymic, with full range of affect. She denies any suicidal or homicidal ideations . She denies hallucinations, and does not appear internally preoccupied at this time. She is calm and pleasant. She does continue to have paranoid ideations, although compared to my previous visit, she seems less intensely focused on these issues, and less fearful.  States " I know something was going on , and I do think there are people in the neighborhood who have been stealing, not just from me, and I did hear from someone that they practiced witchcraft"  She denies symptoms of depression.  She is currently on Seroquel 50 mgrs QHS ,and denies side effects.  9/13 Head CT negative  As reviewed in notes, patient may be going to SNF after discharge once medically cleared.  Assessment- remains psychotic, but intensity of these symptoms and fear regarding these seem to be subsiding. She also presents with more stable mood and today seems euthymic.  Recommendations 1. There are no grounds for psychiatric involuntary commitment at this time. 2. No contraindication for discharge to SNF once medically cleared from psychiatric perspective. 3. Would consider increasing Seroquel to 75 mgrs QHS in order to further address ongoing psychotic symptoms.

## 2014-02-22 NOTE — Progress Notes (Signed)
Patient is starting to become agitated. Patient has been toileted twice. RN and nurse tech have been in the room continuously since the beginning of the shift. PCP on call was notified.

## 2014-02-22 NOTE — Progress Notes (Signed)
ANTIBIOTIC CONSULT NOTE - FOLLOW UP  Pharmacy Consult for Primaxin Indication: ESBL UTI  Allergies  Allergen Reactions  . Diphenhydramine Other (See Comments)    UNKNOWN  . Flagyl [Metronidazole] Other (See Comments)    HALLUCINATIONS  . Hydrocodone Other (See Comments)    HALLUCINATIONS  . Meperidine Hcl Nausea And Vomiting  . Penicillins Hives  . Sulfonamide Derivatives Hives    Patient Measurements: Height: 5\' 2"  (157.5 cm) Weight: 212 lb 11.2 oz (96.48 kg) IBW/kg (Calculated) : 50.1  Vital Signs: Temp: 98 F (36.7 C) (09/14 0428) Temp src: Oral (09/14 0428) BP: 157/74 mmHg (09/14 0428) Pulse Rate: 89 (09/14 0428) Intake/Output from previous day: 09/13 0701 - 09/14 0700 In: 243 [P.O.:240; I.V.:3] Out: -  Intake/Output from this shift: Total I/O In: 240 [P.O.:240] Out: -   Labs:  Recent Labs  02/21/14 0555  WBC 5.8  HGB 13.5  PLT 161  CREATININE 0.87   Estimated Creatinine Clearance: 55.9 ml/min (by C-G formula based on Cr of 0.87). 58 ml/min/1.46m2 (normalized)  No results found for this basename: VANCOTROUGH, VANCOPEAK, VANCORANDOM, [place], GENTPEAK, GENTRANDOM, TOBRATROUGH, TOBRAPEAK, TOBRARND, AMIKACINPEAK, AMIKACINTROU, AMIKACIN,  in the last 72 hours   Microbiology: Recent Results (from the past 720 hour(s))  URINE CULTURE     Status: None   Collection Time    02/16/14  8:30 PM      Result Value Ref Range Status   Specimen Description URINE, RANDOM   Final   Special Requests NONE   Final   Culture  Setup Time     Final   Value: 02/17/2014 04:29     Performed at [place]     Final   Value: 60,000 COLONIES/ML     Performed at [facility]   Culture     Final   Value: ESCHERICHIA COLI     Note: Confirmed Extended Spectrum Beta-Lactamase Producer (ESBL)     Note: CRITICAL RESULT CALLED TO, READ BACK BY AND VERIFIED WITH: LEANN REINERT AT 4:27 A.M. ON 02/19/14 WARRB     Performed at [facility]   Report Status 02/19/2014 FINAL   Final   Organism ID, Bacteria ESCHERICHIA COLI   Final    Anti-infectives   Start     Dose/Rate Route Frequency Ordered Stop   02/19/14 0600  imipenem-cilastatin (PRIMAXIN) 500 mg in sodium chloride 0.9 % 100 mL IVPB     500 mg 200 mL/hr over 30 Minutes Intravenous 3 times per day 02/19/14 0514        Assessment: 33F admitted 9/8 with AMS, psych issues found to be in A-fib now urine culture + for ESBL E-coli.  9/11 >>primaxin >>  Tmax: Afebrile WBCs: WNL Renal: SCr 0.87, CrCl~60 ml/minN  9/8 urine: ESBL Ecoli (S- Gent/Primaxin/NTF/Zosyn(would avoid)/Septra, R-Amp/Ancef/CTX/Cipro/LVQ) 9/11 RPR: non reactive  Goal of Therapy:  Eradication of infection Dose per weight and renal function  Plan:   Continue Primaxin 500mg  IV q8h  F/u duration of therapy; PO option would be limited to fosfomycin given sulfa allergy (hives)   Peggyann Juba, PharmD, BCPS Pager: [phone] 02/22/2014,10:04 AM

## 2014-02-22 NOTE — Progress Notes (Signed)
Clinical Social Work  CSW discussed case with attending MD who reports patient remains psychotic and she requested psych MD to evaluate patient again. CSW spoke with [employer] and updated them on DC plans. SNF remains agreeable to accept patient once she is medically stable. CSW will continue to follow.  [place], [place] [phone]

## 2014-02-22 NOTE — Progress Notes (Signed)
Progress Note   Monica Strong DXI:[id number] DOB: 1931-07-25 DOA: 02/16/2014 PCP: Vena Austria, MD   Brief Narrative:   Monica Strong is an 80 y.o. female with a PMH of paranoid psychosis and delusions intermittent over the past 5 years, recently started on Seroquel by PCP who was brought to the hospital 02/16/14 after becoming acutely paranoid and agitated necessitating intervention by the police department. Upon initial evaluation in the ED, the patient was noted to be in atrial fibrillation with heart rate 105-110. Psychiatry consulting.  Assessment/Plan:   Principal Problem:   Atrial fibrillation / troponin level elevated  No prior history of atrophic fibrillation. Initial troponin elevated, subsequent troponins negative. Elevated troponin likely from demand ischemia.  Monitored on telemetry where she has remained in atrial fibrillation.  Continue Cardizem for rate control.  TSH WNL.  CHADS2 = 1 (age > 17), no documented history of diabetes, hypertension, prior TIA/stroke, or heart failure. She has a annual 2.8% chance of stroke, but with current underlying psychiatric problems, there may be issues with compliance. Continue aspirin for now, further outpatient followup with cardiologist to determine if therapeutic anticoagulation is warranted.  2-D echocardiogram shows EF 65-70%. No regional wall motion abnormalities.  Active Problems:   OSA  Continue nocturnal CPAP.    Altered mental status/Paranoia/psychosis  The patient has a five-year history of intermittent psychotic events with extreme paranoia including believing that her neighbors are poisoning her and sending her messages from the television. She is currently fairly lucid. PCP recently started her on Seroquel which was effective when she first began this medication, but its effectiveness has waned over time.  Seen by psychiatrist 02/18/14. B12/RPR (B12 WNL, RPR non-reactive) ordered per  recommendations.  Seroquel changed from 25 mg twice a day to 50 mg each bedtime 02/21/14 however the patient continues to have nocturnal psychosis/delusions.  Ongoing nocturnal agitation delusions and paranoia noted. Psychiatry re-consulted.   ESBL Escherichia coli UTI  Continue Primaxin.     DVT Prophylaxis  Continue subcutaneous heparin.  Code Status: Full. Family Communication: Daughter, Shirlean Mylar can be reached at [phone] (home) or [phone] (cell), message left today. Disposition Plan: SNF when stable.   IV Access:    Peripheral IV   Procedures and diagnostic studies:   Ct Head Wo Contrast 02/16/2014: No acute abnormality.    Dg Chest Port 1 View 02/17/2014 : No evidence of active pulmonary disease.     Ct Head Wo Contrast 02/21/2014: *No acute intracranial abnormalities. *The appearance of the brain is normal.    Medical Consultants:    Dr. Neita Garnet, Psychiatry  Dr. Jenkins Rouge, Cardiology   Other Consultants:    None.   Anti-Infectives:    Primaxin 02/19/14--->  Subjective:   Monica Strong had agitation and paranoia/psychosis.  She believes that witchcraft is being practiced to upset her and asserts her strong [place] faith.  She is not actively hallucinating but rather has delusions about irrational ideas and seems to think these things are quite real.  RN reports that she was in an altered state of awareness in the night.  Objective:    Filed Vitals:   02/21/14 1359 02/21/14 2001 02/21/14 2139 02/22/14 0428  BP: 138/87 129/67 139/92 157/74  Pulse: 70 85 87 89  Temp: 97.4 F (36.3 C) 98.8 F (37.1 C) 98.3 F (36.8 C) 98 F (36.7 C)  TempSrc: Oral Oral Oral Oral  Resp: 18 18 18 18   Height:      Weight:  SpO2: 96% 98% 98% 94%    Intake/Output Summary (Last 24 hours) at 02/22/14 0828 Last data filed at 02/21/14 2319  Gross per 24 hour  Intake    243 ml  Output      0 ml  Net    243 ml    Exam: Gen:  NAD, calm Cardiovascular:   Heart sounds irregularly irregular Respiratory:  Lungs CTAB Gastrointestinal:  Abdomen soft, NT/ND, + BS Extremities:  No C/E/C   Data Reviewed:    Labs: Basic Metabolic Panel:  Recent Labs Lab 02/16/14 2041 02/17/14 0430 02/21/14 0555  NA 146 143 144  K 3.8 3.9 3.8  CL 108 107 107  CO2 24 22 24   GLUCOSE 116* 109* 107*  BUN 25* 23 18  CREATININE 0.90 0.78 0.87  CALCIUM 9.8 9.5 9.6   GFR Estimated Creatinine Clearance: 55.9 ml/min (by C-G formula based on Cr of 0.87). Liver Function Tests:  Recent Labs Lab 02/16/14 2041 02/17/14 0430  AST 25 21  ALT 17 15  ALKPHOS 74 72  BILITOT 0.6 0.6  PROT 7.0 6.6  ALBUMIN 3.9 3.7    Recent Labs Lab 02/16/14 2053  AMMONIA 31   CBC:  Recent Labs Lab 02/16/14 2041 02/17/14 0430 02/21/14 0555  WBC 5.9 5.9 5.8  HGB 14.5 13.7 13.5  HCT 44.3 42.6 41.9  MCV 95.3 96.2 96.3  PLT 203 199 161   Cardiac Enzymes:  Recent Labs Lab 02/17/14 1339 02/17/14 1846 02/18/14 0110  TROPONINI <0.30 <0.30 <0.30   CBG:  Recent Labs Lab 02/20/14 0802 02/20/14 1202 02/21/14 0354 02/21/14 0721 02/22/14 0745  GLUCAP 121* 112* 97 99 38   Microbiology Recent Results (from the past 240 hour(s))  URINE CULTURE     Status: None   Collection Time    02/16/14  8:30 PM      Result Value Ref Range Status   Specimen Description URINE, RANDOM   Final   Special Requests NONE   Final   Culture  Setup Time     Final   Value: 02/17/2014 04:29     Performed at [place]     Final   Value: 60,000 COLONIES/ML     Performed at [facility]   Culture     Final   Value: ESCHERICHIA COLI     Note: Confirmed Extended Spectrum Beta-Lactamase Producer (ESBL)     Note: CRITICAL RESULT CALLED TO, READ BACK BY AND VERIFIED WITH: LEANN REINERT AT 4:27 A.M. ON 02/19/14 WARRB     Performed at [facility]   Report Status 02/19/2014 FINAL   Final   Organism ID, Bacteria ESCHERICHIA COLI   Final      Medications:   . aspirin EC  325 mg Oral Daily  . diltiazem  90 mg Oral Q12H  . folic acid  1 mg Oral Daily  . heparin  5,000 Units Subcutaneous 3 times per day  . imipenem-cilastatin  500 mg Intravenous 3 times per day  . multivitamin with minerals  1 tablet Oral Daily  . pantoprazole  40 mg Oral Daily  . pramoxine-mineral oil-zinc   Rectal TID  . QUEtiapine  50 mg Oral QHS  . sodium chloride  3 mL Intravenous Q12H  . thiamine  100 mg Oral Daily   Continuous Infusions:   Time spent: 25 minutes.   LOS: 6 days   [facility] Pager [phone]. If unable to reach me by pager,  please call my cell phone at [phone].  *Please refer to [email or web address], password [email or web address] to get updated schedule on who will round on this patient, as hospitalists switch teams weekly. If 7PM-7AM, please contact night-coverage at [email or web address], password [email or web address] for any overnight needs.  02/22/2014, 8:28 AM

## 2014-02-22 NOTE — Progress Notes (Signed)
Pt called out for bath room assistance. Took toothbrush and drink into bathroom. Left drink in bathroom after she used it. Put toothbrush in her pocket. She would not sit down for about 15 minutes. She had a starring standoff with the tech, lasting 10 minutes. She told the tech that she had already trapped her twice. She said the pillow is full of crap - bad stuff. She told the tech to sit in her spot and she would sit in her spot. I asked her where she was and she said [place]. I asked her to please sit down because her heart rate was up, she asked what her heart rate was and I told her 126. Finally convinced her to sit down on bed and lay down. She was very paranoid and spaced out. We got her covered with blankets, check her pupils they were reactive. The whole time she was standing up she had a strong grip on her cane. We will continue to monitor the patient.

## 2014-02-23 ENCOUNTER — Encounter (HOSPITAL_COMMUNITY): Payer: Self-pay | Admitting: Internal Medicine

## 2014-02-23 DIAGNOSIS — I248 Other forms of acute ischemic heart disease: Secondary | ICD-10-CM

## 2014-02-23 DIAGNOSIS — Z8619 Personal history of other infectious and parasitic diseases: Secondary | ICD-10-CM

## 2014-02-23 HISTORY — DX: Personal history of other infectious and parasitic diseases: Z86.19

## 2014-02-23 LAB — GLUCOSE, CAPILLARY: GLUCOSE-CAPILLARY: 102 mg/dL — AB (ref 70–99)

## 2014-02-23 MED ORDER — MENTHOL 3 MG MT LOZG
1.0000 | LOZENGE | OROMUCOSAL | Status: DC | PRN
  Administered 2014-02-23: 3 mg via ORAL
  Filled 2014-02-23: qty 9

## 2014-02-23 MED ORDER — DILTIAZEM HCL ER 90 MG PO CP12: 90.0000 mg | ORAL_CAPSULE | Freq: Two times a day (BID) | ORAL | Status: DC

## 2014-02-23 MED ORDER — ALUM & MAG HYDROXIDE-SIMETH 200-200-20 MG/5ML PO SUSP: 30.0000 mL | ORAL | Status: DC | PRN

## 2014-02-23 MED ORDER — ASPIRIN 325 MG PO TBEC: 325.0000 mg | DELAYED_RELEASE_TABLET | Freq: Every day | ORAL | Status: DC

## 2014-02-23 MED ORDER — ADULT MULTIVITAMIN W/MINERALS CH: 1.0000 | ORAL_TABLET | Freq: Every day | ORAL | Status: DC

## 2014-02-23 MED ORDER — QUETIAPINE FUMARATE 25 MG PO TABS: 75.0000 mg | ORAL_TABLET | Freq: Every day | ORAL | Status: DC

## 2014-02-23 MED ORDER — PRAMOXINE-ZINC OXIDE IN MO 1-12.5 % RE OINT: TOPICAL_OINTMENT | Freq: Three times a day (TID) | RECTAL | Status: DC

## 2014-02-23 MED ORDER — DSS 100 MG PO CAPS: 100.0000 mg | ORAL_CAPSULE | Freq: Two times a day (BID) | ORAL | Status: DC | PRN

## 2014-02-23 MED ORDER — BISACODYL 10 MG RE SUPP: 10.0000 mg | Freq: Once | RECTAL | Status: DC

## 2014-02-23 NOTE — Progress Notes (Signed)
PT Cancellation Note  Patient Details Name: Monica Strong MRN: [id number] DOB: [birth year]   Cancelled Treatment:    Reason Eval/Treat Not Completed: Other (comment) (patient is not mentally able to participate. )   Claretha Cooper 02/23/2014, 12:57 PM Tresa Endo PT [phone]

## 2014-02-23 NOTE — Progress Notes (Deleted)
Clinical Social Work Department CLINICAL SOCIAL WORK PLACEMENT NOTE 02/23/2014  Patient:  Monica Strong  Account Number:  [id number] Admit date:  02/21/2014  Clinical Social Worker:  Renold Genta  Date/time:  02/23/2014 03:30 PM  Clinical Social Work is seeking post-discharge placement for this patient at the following level of care:   SKILLED NURSING   (*CSW will update this form in Epic as items are completed)   02/23/2014  Patient/family provided with [place] Department of Clinical Social Work's list of facilities offering this level of care within the geographic area requested by the patient (or if unable, by the patient's family).  02/23/2014  Patient/family informed of their freedom to choose among providers that offer the needed level of care, that participate in Medicare, Medicaid or managed care program needed by the patient, have an available bed and are willing to accept the patient.  02/23/2014  Patient/family informed of [facility]' ownership interest in [facility], as well as of the fact that they are under no obligation to receive care at this facility.  PASARR submitted to EDS on 02/23/2014 PASARR number received on 02/23/2014  FL2 transmitted to all facilities in geographic area requested by pt/family on  02/23/2014 FL2 transmitted to all facilities within larger geographic area on   Patient informed that his/her managed care company has contracts with or will negotiate with  certain facilities, including the following:     Patient/family informed of bed offers received:   Patient chooses bed at  Physician recommends and patient chooses bed at    Patient to be transferred to  on   Patient to be transferred to facility by  Patient and family notified of transfer on  Name of family member notified:    The following physician request were entered in Epic:   Additional Comments:   Raynaldo Opitz, [place] Social Worker cell #: [phone]

## 2014-02-23 NOTE — Discharge Summary (Signed)
Physician Discharge Summary  Monica Strong WEX:[id number] DOB: 12-22-1931 DOA: 02/16/2014  PCP: Vena Austria, MD  Admit date: 02/16/2014 Discharge date: 02/23/2014   Recommendations for Outpatient Follow-Up:   1. Recommend outpatient referral to psychiatrist for ongoing symptom control. 2. Recommend outpatient referral to cardiology to re-assess whether the patient would benefit from chronic anti-coagulation. 3. The patient was treated for ESBL E. Coli UTI. 4. Continue nocturnal CPAP.   Discharge Diagnosis:   Principal Problem:    Altered mental status with paranoid delusions and psychosis Active Problems:    Morbid obesity    SLEEP APNEA, OBSTRUCTIVE    Atrial fibrillation    Paranoia    Troponin level elevated    Psychosis    ESBL E. Coli UTI   Discharge Condition: Improved.  Diet recommendation: Low sodium, heart healthy.     History of Present Illness:   Monica Strong is an 80 y.o. female with a PMH of paranoid psychosis and delusions intermittent over the past 5 years, recently started on Seroquel by PCP who was brought to the hospital 02/16/14 after becoming acutely paranoid and agitated necessitating intervention by the police department. Upon initial evaluation in the ED, the patient was noted to be in atrial fibrillation with heart rate 105-110. Psychiatry consulted for assessment of paranoid delusions/hallucinations.  Hospital Course by Problem:   Principal Problem:  Altered mental status/Paranoia/psychosis  The patient has a five-year history of intermittent psychotic events with extreme paranoia including believing that her neighbors are poisoning her and sending her messages from the television. She is currently fairly lucid. PCP recently started her on Seroquel which was effective when she first began this medication, but its effectiveness has waned over time.  Seen by psychiatrist 02/18/14. B12/RPR (B12 WNL, RPR non-reactive) ordered per  recommendations.  Seroquel changed from 25 mg twice a day to 50 mg each bedtime 02/21/14 however the patient continues to have nocturnal psychosis/delusions and after psychiatric re-evaluation 02/22/14, Seroquel increased to 75mg  Q HS.  Recommend close outpatient follow up with a psychiatrist to titrate medications for relief of symptoms of psychosis, which seems to manifest in the night time.  Active Problems:  Atrial fibrillation / troponin level elevated / demand ischemia  No prior history of atrial fibrillation. Initial troponin elevated, subsequent troponins negative. Elevated troponin likely from demand ischemia.  Monitored on telemetry where she has remained in atrial fibrillation.  Continue Cardizem for rate control.  TSH WNL.  CHADS2 = 1 (age > 47), no documented history of diabetes, hypertension, prior TIA/stroke, or heart failure. She has a annual 2.8% chance of stroke, but with current underlying psychiatric problems, there may be issues with compliance. Continue aspirin for now, further outpatient followup with cardiologist to determine if therapeutic anticoagulation is warranted.  2-D echocardiogram shows EF 65-70%. No regional wall motion abnormalities.  OSA  Continue nocturnal CPAP.  ESBL Escherichia coli UTI  Treated with 5 days of Primaxin.  Medical Consultants:   Dr. Neita Garnet, Psychiatry Dr. Jenkins Rouge, Cardiology   Discharge Exam:   Filed Vitals:   02/23/14 0642  BP: 152/84  Pulse: 77  Temp: 98 F (36.7 C)  Resp: 16   Filed Vitals:   02/22/14 0428 02/22/14 1450 02/22/14 2022 02/23/14 0642  BP: 157/74 120/50 137/74 152/84  Pulse: 89 75 72 77  Temp: 98 F (36.7 C) 98.2 F (36.8 C) 98.3 F (36.8 C) 98 F (36.7 C)  TempSrc: Oral Oral Oral Oral  Resp: 18 18 18  16  Height:      Weight:      SpO2: 94% 98% 97% 96%    Gen:  NAD, calm Cardiovascular:  RRR, No M/R/G Respiratory: Lungs CTAB Gastrointestinal: Abdomen soft, NT/ND with normal active  bowel sounds. Extremities: No C/E/C   The results of significant diagnostics from this hospitalization (including imaging, microbiology, ancillary and laboratory) are listed below for reference.     Procedures and Diagnostic Studies:   Ct Head Wo Contrast 02/16/2014: No acute abnormality.   Dg Chest Port 1 View 02/17/2014 : No evidence of active pulmonary disease.   Ct Head Wo Contrast 02/21/2014: *No acute intracranial abnormalities. *The appearance of the brain is normal.   Labs:   Basic Metabolic Panel:  Recent Labs Lab 02/16/14 2041 02/17/14 0430 02/21/14 0555  NA 146 143 144  K 3.8 3.9 3.8  CL 108 107 107  CO2 24 22 24   GLUCOSE 116* 109* 107*  BUN 25* 23 18  CREATININE 0.90 0.78 0.87  CALCIUM 9.8 9.5 9.6   GFR Estimated Creatinine Clearance: 55.9 ml/min (by C-G formula based on Cr of 0.87). Liver Function Tests:  Recent Labs Lab 02/16/14 2041 02/17/14 0430  AST 25 21  ALT 17 15  ALKPHOS 74 72  BILITOT 0.6 0.6  PROT 7.0 6.6  ALBUMIN 3.9 3.7    Recent Labs Lab 02/16/14 2053  AMMONIA 31   CBC:  Recent Labs Lab 02/16/14 2041 02/17/14 0430 02/21/14 0555  WBC 5.9 5.9 5.8  HGB 14.5 13.7 13.5  HCT 44.3 42.6 41.9  MCV 95.3 96.2 96.3  PLT 203 199 161   Cardiac Enzymes:  Recent Labs Lab 02/17/14 1339 02/17/14 1846 02/18/14 0110  TROPONINI <0.30 <0.30 <0.30   CBG:  Recent Labs Lab 02/20/14 1202 02/21/14 0354 02/21/14 0721 02/22/14 0745 02/23/14 0801  GLUCAP 112* 97 99 18 102*   Microbiology Recent Results (from the past 240 hour(s))  URINE CULTURE     Status: None   Collection Time    02/16/14  8:30 PM      Result Value Ref Range Status   Specimen Description URINE, RANDOM   Final   Special Requests NONE   Final   Culture  Setup Time     Final   Value: 02/17/2014 04:29     Performed at [facility]     Final   Value: 60,000 COLONIES/ML     Performed at [facility]   Culture     Final   Value:  ESCHERICHIA COLI     Note: Confirmed Extended Spectrum Beta-Lactamase Producer (ESBL)     Note: CRITICAL RESULT CALLED TO, READ BACK BY AND VERIFIED WITH: LEANN REINERT AT 4:27 A.M. ON 02/19/14 WARRB     Performed at [facility]   Report Status 02/19/2014 FINAL   Final   Organism ID, Bacteria ESCHERICHIA COLI   Final     Discharge Instructions:   Discharge Instructions   Call MD for:    Complete by:  As directed   Chest pain, agitation, hallucinations.     Diet - low sodium heart healthy    Complete by:  As directed      Discharge instructions    Complete by:  As directed   You were cared for by Dr. Jacquelynn Cree  (a hospitalist) during your hospital stay. If you have any questions about your discharge medications or the care you received while you were in the hospital after you  are discharged, you can call the unit and ask to speak with the hospitalist on call if the hospitalist that took care of you is not available. Once you are discharged, your primary care physician will handle any further medical issues. Please note that NO REFILLS for any discharge medications will be authorized once you are discharged, as it is imperative that you return to your primary care physician (or establish a relationship with a primary care physician if you do not have one) for your aftercare needs so that they can reassess your need for medications and monitor your lab values.  Any outstanding tests can be reviewed by your PCP at your follow up visit.  It is also important to review any medicine changes with your PCP.  Please bring these d/c instructions with you to your next visit so your physician can review these changes with you.  If you do not have a primary care physician, you can call [phone] for a physician referral.  It is highly recommended that you obtain a PCP for hospital follow up.     Increase activity slowly    Complete by:  As directed             Medication List          aspirin 325 MG EC tablet  Take 1 tablet (325 mg total) by mouth daily.     diltiazem 90 MG 12 hr capsule  Commonly known as:  CARDIZEM SR  Take 1 capsule (90 mg total) by mouth every 12 (twelve) hours.     DSS 100 MG Caps  Take 100 mg by mouth 2 (two) times daily as needed for mild constipation.     multivitamin with minerals Tabs tablet  Take 1 tablet by mouth daily.     omeprazole 20 MG capsule  Commonly known as:  PRILOSEC  Take 20 mg by mouth daily.     pramoxine-mineral oil-zinc 1-12.5 % rectal ointment  Commonly known as:  TUCKS  Place rectally 3 (three) times daily.     QUEtiapine 25 MG tablet  Commonly known as:  SEROQUEL  Take 3 tablets (75 mg total) by mouth at bedtime.     sennosides-docusate sodium 8.6-50 MG tablet  Commonly known as:  SENOKOT-S  Take 1 tablet by mouth 2 (two) times daily. While taking pain meds to prevent constipation          Time coordinating discharge: 35 minutes.  Signed:  RAMA,CHRISTINA  Pager [phone] [facility] 02/23/2014, 12:57 PM

## 2014-02-23 NOTE — Progress Notes (Deleted)
Clinical Social Work Department BRIEF PSYCHOSOCIAL ASSESSMENT 02/23/2014  Patient:  Carolanne Grumbling     Account Number:  [id number]     Admit date:  02/21/2014  Clinical Social Worker:  Renold Genta  Date/Time:  02/23/2014 03:24 PM  Referred by:  Physician  Date Referred:  02/23/2014 Referred for  Other - See comment   Other Referral:   Admitted from: [place] type:  Family Other interview type:   patient's daughter, Perrin Smack at bedside    PSYCHOSOCIAL DATA Living Status:  FACILITY Admitted from facility:  [facility] Level of care:  Independent Living Primary support name:  Charma Igo (dtr) c#: [phone] Primary support relationship to patient:  CHILD, ADULT Degree of support available:   good    CURRENT CONCERNS Current Concerns  Post-Acute Placement   Other Concerns:    SOCIAL WORK ASSESSMENT / PLAN CSW received consult that patient was admitted from [facility] with 24 hour caregivers.   Assessment/plan status:  Information/Referral to [employer] Other assessment/ plan:   Information/referral to community resources:   CSW completed FL2 and faxed information to [facility] - provided list of facilities to daughter.    PATIENT'S/FAMILY'S RESPONSE TO PLAN OF CARE: Patient & daughter are known to this CSW from previous hospitalization 8/10 - 8/12 when patient was admitted from [facility] and moved to [facility] with 24 hour caregivers. Daughter states that [facility] and the caregivers have been working out really well. Daughter to discuss with [facility] whether going to SNF or returning to [facility] living with caregivers would be best for the patient due to dx: dementia.       Raynaldo Opitz, [facility] Clinical Social Worker cell #: [phone]

## 2014-02-23 NOTE — Care Management Note (Signed)
    Page 1 of 1   02/23/2014     2:00:26 PM CARE MANAGEMENT NOTE 02/23/2014  Patient:  Monica Strong, Monica Strong   Account Number:  [id number]  Date Initiated:  02/18/2014  Documentation initiated by:  Gabriel Earing  Subjective/Objective Assessment:   Pt admitted with AMS, Afib     Action/Plan:   from home   Anticipated DC Date:  02/20/2014   Anticipated DC Plan:  HOME/SELF CARE  In-house referral  Clinical Social Worker      DC Planning Services  CM consult      Choice offered to / List presented to:             Status of service:  In process, will continue to follow Medicare Important Message given?  YES (If response is "NO", the following Medicare IM given date fields will be blank) Date Medicare IM given:  02/19/2014 Medicare IM given by:  Gabriel Earing Date Additional Medicare IM given:  02/22/2014 Additional Medicare IM given by:  Gabriel Earing  Discharge Disposition:  [place]  Per UR Regulation:  Reviewed for med. necessity/level of care/duration of stay  If discussed at [place] of Stay Meetings, dates discussed:   02/23/2014    Comments:  02/23/14 [facility] RN,BSN NCM [phone] D/C SNF.  02/22/14 MMcGibboney, RN, BSN Psychiatry, following pt.  02/18/14 MMcGibboney, RN, BSN Chart reviewed.

## 2014-02-23 NOTE — Progress Notes (Signed)
Picked up patient from [place], [place] at [phone]. Patient to be discharged to [facility], report called to SNF by Daine Floras, RN. Agree with previous RN's assessment. Patient to be transported to SNF at around 1630 by ambulance per [facility], [place].

## 2014-02-23 NOTE — Progress Notes (Signed)
Clinical Social Work  Dtr returned [place] phone call and is aware and agreeable to DC plans. Dtr reports that she is almost to the hospital and will talk with patient about DC to SNF. Dtr reports that she feels overwhelmed but knows that patient needs SNF prior to returning home. Dtr agreeable to [facility] and is aware of no guarantee of payment. PTAR request #: [id number].  CSW is signing off but available if needed.  [place], [place] [phone]

## 2014-02-23 NOTE — Progress Notes (Addendum)
Clinical Social Work  CSW faxed DC summary to [facility] who is ready to accept. CSW shared patient's behaviors re: paranoia and delusion and explained that psych MD had cleared patient. SNF agreeable to accept. IVC paperwork expires today as well. CSW prepared DC packet with FL2 and DC summary included. RN to call report. CSW has called and left a message with dtr Shirlean Mylar) re: DC plans. CSW will await a return call from dtr and then will arrange transportation for DC to SNF.  RN aware of DC plans.  [place], [place] [phone]

## 2014-04-05 ENCOUNTER — Observation Stay (HOSPITAL_COMMUNITY)
Admission: EM | Admit: 2014-04-05 | Discharge: 2014-04-06 | Payer: Medicare Other | Attending: Internal Medicine | Admitting: Internal Medicine

## 2014-04-05 ENCOUNTER — Encounter (HOSPITAL_COMMUNITY): Payer: Self-pay | Admitting: Emergency Medicine

## 2014-04-05 DIAGNOSIS — E669 Obesity, unspecified: Secondary | ICD-10-CM | POA: Diagnosis not present

## 2014-04-05 DIAGNOSIS — Z79899 Other long term (current) drug therapy: Secondary | ICD-10-CM | POA: Diagnosis not present

## 2014-04-05 DIAGNOSIS — Z6839 Body mass index (BMI) 39.0-39.9, adult: Secondary | ICD-10-CM | POA: Diagnosis not present

## 2014-04-05 DIAGNOSIS — R55 Syncope and collapse: Secondary | ICD-10-CM | POA: Diagnosis not present

## 2014-04-05 DIAGNOSIS — I4891 Unspecified atrial fibrillation: Secondary | ICD-10-CM | POA: Diagnosis present

## 2014-04-05 DIAGNOSIS — Z7982 Long term (current) use of aspirin: Secondary | ICD-10-CM | POA: Insufficient documentation

## 2014-04-05 DIAGNOSIS — I482 Chronic atrial fibrillation, unspecified: Secondary | ICD-10-CM

## 2014-04-05 LAB — I-STAT CHEM 8, ED
BUN: 20 mg/dL (ref 6–23)
CALCIUM ION: 1.21 mmol/L (ref 1.13–1.30)
CHLORIDE: 107 meq/L (ref 96–112)
Creatinine, Ser: 0.8 mg/dL (ref 0.50–1.10)
Glucose, Bld: 92 mg/dL (ref 70–99)
HCT: 41 % (ref 36.0–46.0)
Hemoglobin: 13.9 g/dL (ref 12.0–15.0)
POTASSIUM: 4 meq/L (ref 3.7–5.3)
Sodium: 142 mEq/L (ref 137–147)
TCO2: 25 mmol/L (ref 0–100)

## 2014-04-05 LAB — URINE MICROSCOPIC-ADD ON

## 2014-04-05 LAB — BASIC METABOLIC PANEL
Anion gap: 9 (ref 5–15)
BUN: 20 mg/dL (ref 6–23)
CHLORIDE: 108 meq/L (ref 96–112)
CO2: 26 mEq/L (ref 19–32)
Calcium: 9.2 mg/dL (ref 8.4–10.5)
Creatinine, Ser: 0.8 mg/dL (ref 0.50–1.10)
GFR calc Af Amer: 79 mL/min — ABNORMAL LOW (ref >90)
GFR, EST NON AFRICAN AMERICAN: 68 mL/min — AB (ref >90)
Glucose, Bld: 95 mg/dL (ref 70–99)
Potassium: 4.2 mEq/L (ref 3.7–5.3)
SODIUM: 143 meq/L (ref 137–147)

## 2014-04-05 LAB — CBC
HEMATOCRIT: 40.4 % (ref 36.0–46.0)
HEMOGLOBIN: 12.8 g/dL (ref 12.0–15.0)
MCH: 30.8 pg (ref 26.0–34.0)
MCHC: 31.7 g/dL (ref 30.0–36.0)
MCV: 97.3 fL (ref 78.0–100.0)
Platelets: 151 10*3/uL (ref 150–400)
RBC: 4.15 MIL/uL (ref 3.87–5.11)
RDW: 13.8 % (ref 11.5–15.5)
WBC: 4.4 10*3/uL (ref 4.0–10.5)

## 2014-04-05 LAB — URINALYSIS, ROUTINE W REFLEX MICROSCOPIC
Bilirubin Urine: NEGATIVE
GLUCOSE, UA: NEGATIVE mg/dL
Hgb urine dipstick: NEGATIVE
Ketones, ur: NEGATIVE mg/dL
Nitrite: NEGATIVE
PH: 5 (ref 5.0–8.0)
Protein, ur: NEGATIVE mg/dL
SPECIFIC GRAVITY, URINE: 1.025 (ref 1.005–1.030)
Urobilinogen, UA: 0.2 mg/dL (ref 0.0–1.0)

## 2014-04-05 LAB — TROPONIN I

## 2014-04-05 MED ORDER — SENNOSIDES-DOCUSATE SODIUM 8.6-50 MG PO TABS
1.0000 | ORAL_TABLET | Freq: Two times a day (BID) | ORAL | Status: DC
  Administered 2014-04-05 – 2014-04-06 (×2): 1 via ORAL
  Filled 2014-04-05 (×3): qty 1

## 2014-04-05 MED ORDER — PANTOPRAZOLE SODIUM 40 MG PO TBEC
40.0000 mg | DELAYED_RELEASE_TABLET | Freq: Every day | ORAL | Status: DC
  Administered 2014-04-06: 40 mg via ORAL
  Filled 2014-04-05: qty 1

## 2014-04-05 MED ORDER — ONDANSETRON HCL 4 MG/2ML IJ SOLN
4.0000 mg | Freq: Four times a day (QID) | INTRAMUSCULAR | Status: DC | PRN
Start: 2014-04-05 — End: 2014-04-06

## 2014-04-05 MED ORDER — QUETIAPINE FUMARATE ER 200 MG PO TB24
200.0000 mg | ORAL_TABLET | Freq: Every evening | ORAL | Status: DC
  Administered 2014-04-05: 200 mg via ORAL
  Filled 2014-04-05 (×2): qty 1

## 2014-04-05 MED ORDER — DOCUSATE SODIUM 100 MG PO CAPS: 100.0000 mg | ORAL_CAPSULE | Freq: Two times a day (BID) | ORAL | Status: DC

## 2014-04-05 MED ORDER — ASPIRIN EC 325 MG PO TBEC
325.0000 mg | DELAYED_RELEASE_TABLET | Freq: Every day | ORAL | Status: DC
  Administered 2014-04-06: 325 mg via ORAL
  Filled 2014-04-05: qty 1

## 2014-04-05 MED ORDER — SODIUM CHLORIDE 0.9 % IV SOLN
INTRAVENOUS | Status: AC
  Administered 2014-04-05: 02:00:00 via INTRAVENOUS

## 2014-04-05 MED ORDER — ACETAMINOPHEN 325 MG PO TABS: 650.0000 mg | ORAL_TABLET | Freq: Four times a day (QID) | ORAL | Status: DC | PRN

## 2014-04-05 MED ORDER — ONDANSETRON HCL 4 MG PO TABS: 4.0000 mg | ORAL_TABLET | Freq: Four times a day (QID) | ORAL | Status: DC | PRN

## 2014-04-05 MED ORDER — MAGNESIUM HYDROXIDE 400 MG/5ML PO SUSP
30.0000 mL | Freq: Every day | ORAL | Status: DC | PRN
Start: 2014-04-05 — End: 2014-04-06

## 2014-04-05 MED ORDER — SODIUM CHLORIDE 0.9 % IJ SOLN: 3.0000 mL | Freq: Two times a day (BID) | INTRAMUSCULAR | Status: DC

## 2014-04-05 MED ORDER — ENOXAPARIN SODIUM 40 MG/0.4ML ~~LOC~~ SOLN
40.0000 mg | SUBCUTANEOUS | Status: DC
  Filled 2014-04-05 (×2): qty 0.4

## 2014-04-05 MED ORDER — DILTIAZEM HCL 60 MG PO TABS
60.0000 mg | ORAL_TABLET | Freq: Two times a day (BID) | ORAL | Status: DC
  Administered 2014-04-05 – 2014-04-06 (×2): 60 mg via ORAL
  Filled 2014-04-05 (×3): qty 1

## 2014-04-05 NOTE — ED Provider Notes (Addendum)
CSN: [id number]     Arrival date & time 04/05/14  1035 History   First MD Initiated Contact with Patient 04/05/14 1119    History is obtained fromJamie Lanae Boast, nurse at assisted living facility via telephone Chief Complaint  Patient presents with  . Altered Mental Status     (Consider location/radiation/quality/duration/timing/severity/associated sxs/prior Treatment)   HPI patient stated "my head felt funny"and she became unresponsive today 9:40 AM while seated at the breakfast table. She was unresponsive for approximately 10 minutes during which time her pulse was 40 blood pressure 106/69 patient complain of left-sided chest pain Past Medical History  Diagnosis Date  . Renal stones left  . GERD (gastroesophageal reflux disease)   . H/O hiatal hernia   . Pulmonary nodules BENIGN--  MONITORED BY PCP DR READE--  ASYMPTOMATIC     LAST CHEST CT 08-17-2011  . Heart murmur MILD -- ASYMPTOMATIC  . Urge urinary incontinence   . Sinus drainage   . Neuromuscular disorder     rt hand numbness  . Dysuria     has urethral bump  . Ureteral stent retained   . Arthritis   . OSA (obstructive sleep apnea)     not using cpap  . Paranoia   . Obesity   . Chest pain     a. Normal stress test 08/2008 and CTA neg for PE at that time.  Jola Baptist ear dysfunction     a. Prior h/o vertigo.  Marland Kitchen History of infection due to ESBL Escherichia coli 02/23/2014   history of present illness continued, left chest pain is presently in mild. No other associated symptoms. No treatment prior to coming here. Past Surgical History  Procedure Laterality Date  . Total knee arthroplasty  10-17-2009  DR ALUISIO    LEFT  . Right total knee arthroplasty  04-25-2009  . Tonsillectomy    . Cardiovascular stress test  03-13-2007  dr Tressia Miners turner    NORMAL LV SIZE SYSTOLIC FUCTION/ NO ISCHEMIA/ EF 85%  . Transthoracic echocardiogram  08-18-2008    NORMAL LVSF/ EF 55-60%/ MILD MITRAL REGURG .  Marland Kitchen Removal breast cyst, benign  1960'S   . Vaginal hysterectomy  1970  . Cataract extraction w/ intraocular lens  implant, bilateral    . Blepharoplasty      BILATERAL  . Cystoscopy with retrograde pyelogram, ureteroscopy and stent placement  05/14/2012    Procedure: [place], URETEROSCOPY AND STENT PLACEMENT;  Surgeon: Alexis Frock, MD;  Location: [facility];  Service: Urology;  Laterality: Left;  1ST STAGE LEFT URETEROSCOPY, LEFT RETROGRADE, DIGITAL URETEROSCOPY,  LEFT STONE REMOVAL WITH ESCAPE BASKET,  STENT PLACEMENt   . Holmium laser application  38/06/8297    Procedure: HOLMIUM LASER APPLICATION;  Surgeon: Alexis Frock, MD;  Location: [facility];  Service: Urology;  Laterality: Left;  . Holmium laser application  08/16/1694    Procedure: HOLMIUM LASER APPLICATION;  Surgeon: Alexis Frock, MD;  [facility];  Service: Urology;  Laterality: Left;  . Cystoscopy/retrograde/ureteroscopy/stone extraction with basket  06/13/2012    Procedure: CYSTOSCOPY/RETROGRADE/URETEROSCOPY/STONE EXTRACTION WITH BASKET;  Surgeon: Alexis Frock, MD;  Location: [facility];  Service: Urology;  Laterality: Left;  . Cystoscopy w/ ureteral stent placement  06/13/2012    Procedure: CYSTOSCOPY WITH STENT REPLACEMENT;  Surgeon: Alexis Frock, MD;  [facility];  Service: Urology;  Laterality: Left;  . Appendectomy      PT STATES PER XRAY SMALL AMOUNT  OF APPENDIX LEFT  . Cystoscopy w/ ureteral stent removal  07/11/2012    Procedure: CYSTOSCOPY WITH STENT REMOVAL;  Surgeon: Alexis Frock, MD;  Location: [facility];  Service: Urology;  Laterality: Left;  . Cystoscopy w/ retrogrades  07/11/2012    Procedure: CYSTOSCOPY WITH RETROGRADE PYELOGRAM;  Surgeon: Alexis Frock, MD;  Location: [facility];  Service: Urology;  Laterality: Left;  left third stage ureteroscopystone manipulation  . Ureteroscopy  07/11/2012     Procedure: URETEROSCOPY;  Surgeon: Alexis Frock, MD;  Location: [facility];  Service: Urology;  Laterality: Left;   Family History  Problem Relation Age of Onset  . Other      Pt unaware due to psych condition   History  Substance Use Topics  . Smoking status: Never Smoker   . Smokeless tobacco: Never Used  . Alcohol Use: No   OB History   Grav Para Term Preterm Abortions TAB SAB Ect Mult Living                 Review of Systems  Constitutional: Negative.   HENT: Negative.   Respiratory: Negative.   Cardiovascular: Positive for chest pain.  Gastrointestinal: Negative.   Musculoskeletal: Negative.   Skin: Negative.   Neurological: Negative.   Psychiatric/Behavioral: Negative.   All other systems reviewed and are negative.     Allergies  Diphenhydramine; Flagyl; Hydrocodone; Meperidine hcl; Penicillins; and Sulfonamide derivatives  Home Medications   Prior to Admission medications   Medication Sig Start Date End Date Taking? Authorizing Provider  aspirin EC 325 MG EC tablet Take 1 tablet (325 mg total) by mouth daily. 02/23/14   Venetia Maxon Rama, MD  diltiazem (CARDIZEM SR) 90 MG 12 hr capsule Take 1 capsule (90 mg total) by mouth every 12 (twelve) hours. 02/23/14   Venetia Maxon Rama, MD  docusate sodium 100 MG CAPS Take 100 mg by mouth 2 (two) times daily as needed for mild constipation. 02/23/14   Venetia Maxon Rama, MD  Multiple Vitamin (MULTIVITAMIN WITH MINERALS) TABS tablet Take 1 tablet by mouth daily. 02/23/14   Venetia Maxon Rama, MD  omeprazole (PRILOSEC) 20 MG capsule Take 20 mg by mouth daily.    Historical Provider, MD  pramoxine-mineral oil-zinc (TUCKS) 1-12.5 % rectal ointment Place rectally 3 (three) times daily. 02/23/14   Venetia Maxon Rama, MD  QUEtiapine (SEROQUEL) 25 MG tablet Take 3 tablets (75 mg total) by mouth at bedtime. 02/23/14   Venetia Maxon Rama, MD  sennosides-docusate sodium (SENOKOT-S) 8.6-50 MG tablet Take 1 tablet by mouth 2  (two) times daily. While taking pain meds to prevent constipation 07/11/12   Alexis Frock, MD   BP 156/75  Pulse 78  Temp(Src) 98 F (36.7 C) (Oral)  Resp 12  SpO2 100% Physical Exam  Nursing note and vitals reviewed. Constitutional: She appears well-developed and well-nourished.  HENT:  Head: Normocephalic and atraumatic.  Eyes: Conjunctivae are normal. Pupils are equal, round, and reactive to light.  Neck: Neck supple. No tracheal deviation present. No thyromegaly present.  Cardiovascular: Normal rate.   No murmur heard. Irregularly irregular  Pulmonary/Chest: Effort normal and breath sounds normal.  Abdominal: Soft. Bowel sounds are normal. She exhibits no distension. There is no tenderness.  Musculoskeletal: Normal range of motion. She exhibits no edema and no tenderness.  Neurological: She is alert. Coordination normal.  Skin: Skin is warm and dry. No rash noted.  Psychiatric: She has a normal mood and affect.  ED Course  Procedures (including critical care time) Labs Review Labs Reviewed - No data to display  Imaging Review No results found.   EKG Interpretation   Date/Time:  Monday April 05 2014 10:48:42 EDT Ventricular Rate:  87 PR Interval:    QRS Duration: 95 QT Interval:  365 QTC Calculation: 439 R Axis:   38 Text Interpretation:  Atrial fibrillation Nonspecific repol abnormality,  diffuse leads No significant change since last tracing Confirmed by  Winfred Leeds  MD, Oryan Winterton [phone]) on 04/05/2014 11:26:07 AM      Results for orders placed during the hospital encounter of 04/05/14  TROPONIN I      Result Value Ref Range   Troponin I <0.30  <0.30 ng/mL  CBC      Result Value Ref Range   WBC 4.4  4.0 - 10.5 K/uL   RBC 4.15  3.87 - 5.11 MIL/uL   Hemoglobin 12.8  12.0 - 15.0 g/dL   HCT 40.4  36.0 - 46.0 %   MCV 97.3  78.0 - 100.0 fL   MCH 30.8  26.0 - 34.0 pg   MCHC 31.7  30.0 - 36.0 g/dL   RDW 13.8  11.5 - 15.5 %   Platelets 151  150 - 400 K/uL   I-STAT CHEM 8, ED      Result Value Ref Range   Sodium 142  137 - 147 mEq/L   Potassium 4.0  3.7 - 5.3 mEq/L   Chloride 107  96 - 112 mEq/L   BUN 20  6 - 23 mg/dL   Creatinine, Ser 0.80  0.50 - 1.10 mg/dL   Glucose, Bld 92  70 - 99 mg/dL   Calcium, Ion 1.21  1.13 - 1.30 mmol/L   TCO2 25  0 - 100 mmol/L   Hemoglobin 13.9  12.0 - 15.0 g/dL   HCT 41.0  36.0 - 46.0 %   No results found.  3:20 PM patient's daughter was here, available for interview and states that patient appears at baseline mental status MDM  Concern for bradycardic dysrhythmia given history. Spoke with Dr. Conley Canal plan 23 hour observation telemetry Final diagnoses:  None   diagnosis transient loss of consciousness      Orlie Dakin, MD 04/05/14 [place], MD 04/05/14 1729

## 2014-04-05 NOTE — ED Notes (Signed)
Pt states that she was eating breakfast this morning and started feeling "weak" and just "put my head down" -- staff stated that heart rate dropped to 40-- then called 911. Pt remains oriented-- no further episodes of speaking in another language.

## 2014-04-05 NOTE — ED Notes (Signed)
TO ED via [facility] from [facility] with staff stating that pt became unresponsive at breakfast-- pt became awake and alert and oriented when placed in back of ambulance-- pt states "my heart hurts-- it is emotional pain" pt speaking at intervals in another language-- stating "God is talking to you. I talk in what ever language He does" states that she is afraid that the staff here is going to kill her.

## 2014-04-05 NOTE — ED Notes (Signed)
Dr Winfred Leeds in to see pt

## 2014-04-05 NOTE — H&P (Signed)
[facility] History and Physical  DEBANY VANTOL VHQ:[id number] DOB: 24-Jan-1932 DOA: 04/05/2014  Referring physician: Colin Broach PCP: Vena Austria, MD   Chief Complaint: unresponsive episode  HPI: SAPIR LAVEY is a 80 y.o. female  With h/o atrial fibrillation, htn presents to ED from ALF with unresponsive episode. No definate LOC. Patient reports she felt weak and tired and laid her head on a table. ALF staff report patient was unresponsive, and had HR of 40 with normal blood pressure. Patient  Feels tired and weak currently. No f/c, dysuria, dyspnea, CP.   Review of Systems:  Systems reviewed. As above. Otherwise negative   Past Medical History  Diagnosis Date  . Renal stones left  . GERD (gastroesophageal reflux disease)   . H/O hiatal hernia   . Pulmonary nodules BENIGN--  MONITORED BY PCP DR READE--  ASYMPTOMATIC     LAST CHEST CT 08-17-2011  . Heart murmur MILD -- ASYMPTOMATIC  . Urge urinary incontinence   . Sinus drainage   . Neuromuscular disorder     rt hand numbness  . Dysuria     has urethral bump  . Ureteral stent retained   . Arthritis   . OSA (obstructive sleep apnea)     not using cpap  . Paranoia   . Obesity   . Chest pain     a. Normal stress test 08/2008 and CTA neg for PE at that time.  Jola Baptist ear dysfunction     a. Prior h/o vertigo.  Marland Kitchen History of infection due to ESBL Escherichia coli 02/23/2014   Past Surgical History  Procedure Laterality Date  . Total knee arthroplasty  10-17-2009  DR ALUISIO    LEFT  . Right total knee arthroplasty  04-25-2009  . Tonsillectomy    . Cardiovascular stress test  03-13-2007  dr Tressia Miners turner    NORMAL LV SIZE SYSTOLIC FUCTION/ NO ISCHEMIA/ EF 85%  . Transthoracic echocardiogram  08-18-2008    NORMAL LVSF/ EF 55-60%/ MILD MITRAL REGURG .  Marland Kitchen Removal breast cyst, benign  1960'S  . Vaginal hysterectomy  1970  . Cataract extraction w/ intraocular lens  implant, bilateral    . Blepharoplasty     BILATERAL  . Cystoscopy with retrograde pyelogram, ureteroscopy and stent placement  05/14/2012    Procedure: [place], URETEROSCOPY AND STENT PLACEMENT;  Surgeon: Alexis Frock, MD;  Location: [facility];  Service: Urology;  Laterality: Left;  1ST STAGE LEFT URETEROSCOPY, LEFT RETROGRADE, DIGITAL URETEROSCOPY,  LEFT STONE REMOVAL WITH ESCAPE BASKET,  STENT PLACEMENt   . Holmium laser application  41/08/2438    Procedure: HOLMIUM LASER APPLICATION;  Surgeon: Alexis Frock, MD;  Location: [facility];  Service: Urology;  Laterality: Left;  . Holmium laser application  1/0/2725    Procedure: HOLMIUM LASER APPLICATION;  Surgeon: Alexis Frock, MD;  Location: [facility];  Service: Urology;  Laterality: Left;  . Cystoscopy/retrograde/ureteroscopy/stone extraction with basket  06/13/2012    Procedure: CYSTOSCOPY/RETROGRADE/URETEROSCOPY/STONE EXTRACTION WITH BASKET;  Surgeon: Alexis Frock, MD;  Location: [facility];  Service: Urology;  Laterality: Left;  . Cystoscopy w/ ureteral stent placement  06/13/2012    Procedure: CYSTOSCOPY WITH STENT REPLACEMENT;  Surgeon: Alexis Frock, MD;  Location: [facility];  Service: Urology;  Laterality: Left;  . Appendectomy      PT STATES PER XRAY SMALL AMOUNT OF APPENDIX LEFT  . Cystoscopy w/ ureteral stent removal  07/11/2012    Procedure:  CYSTOSCOPY WITH STENT REMOVAL;  Surgeon: Alexis Frock, MD;  Location: [facility];  Service: Urology;  Laterality: Left;  . Cystoscopy w/ retrogrades  07/11/2012    Procedure: CYSTOSCOPY WITH RETROGRADE PYELOGRAM;  Surgeon: Alexis Frock, MD;  Location: [facility];  Service: Urology;  Laterality: Left;  left third stage ureteroscopystone manipulation  . Ureteroscopy  07/11/2012    Procedure: URETEROSCOPY;  Surgeon: Alexis Frock, MD;  Location: [facility];  Service: Urology;   Laterality: Left;   Social History:  reports that she has never smoked. She has never used smokeless tobacco. She reports that she does not drink alcohol or use illicit drugs.  Allergies  Allergen Reactions  . Diphenhydramine Other (See Comments)    UNKNOWN  . Flagyl [Metronidazole] Other (See Comments)    HALLUCINATIONS  . Hydrocodone Other (See Comments)    HALLUCINATIONS  . Meperidine Hcl Nausea And Vomiting  . Penicillins Hives  . Sulfonamide Derivatives Hives    Family History  Problem Relation Age of Onset  . Other      Pt unaware due to psych condition     Prior to Admission medications   Medication Sig Start Date End Date Taking? Authorizing Provider  acetaminophen (TYLENOL) 325 MG tablet Take 650 mg by mouth every 6 (six) hours as needed for mild pain.   Yes Historical Provider, MD  aspirin EC 325 MG tablet Take 325 mg by mouth daily.   Yes Historical Provider, MD  diltiazem (CARDIZEM) 90 MG tablet Take 90 mg by mouth 2 (two) times daily.   Yes Historical Provider, MD  Multiple Vitamins-Minerals (MULTIVITAMIN PO) Take 1 tablet by mouth daily.   Yes Historical Provider, MD  omeprazole (PRILOSEC) 20 MG capsule Take 20 mg by mouth daily.   Yes Historical Provider, MD  pramoxine-mineral oil-zinc (TUCKS) 1-12.5 % rectal ointment Place 1 application rectally every 4 (four) hours as needed for itching or irritation.   Yes Historical Provider, MD  QUEtiapine (SEROQUEL XR) 300 MG 24 hr tablet Take 300 mg by mouth every evening.   Yes Historical Provider, MD  senna-docusate (SENOKOT-S) 8.6-50 MG per tablet Take 1 tablet by mouth 2 (two) times daily.   Yes Historical Provider, MD   Physical Exam: Filed Vitals:   04/05/14 1513 04/05/14 1600 04/05/14 1638 04/05/14 1700  BP: 148/91 130/75 140/85 138/71  Pulse: 67 72 74 78  Temp:      TempSrc:      Resp: 14 14 20 15   SpO2: 97% 96% 98% 97%    Wt Readings from Last 3 Encounters:  02/21/14 96.48 kg (212 lb 11.2 oz)  05/21/13  99.837 kg (220 lb 1.6 oz)  02/19/13 97.206 kg (214 lb 4.8 oz)    BP 116/65  Pulse 74  Temp(Src) 97.8 F (36.6 C) (Oral)  Resp 17  Ht 5\' 1"  (1.549 m)  Wt 95.664 kg (210 lb 14.4 oz)  BMI 39.87 kg/m2  SpO2 96%  General Appearance:    Alert, cooperative, no distress, appears stated age  Head:    Normocephalic, without obvious abnormality, atraumatic  Eyes:    PERRL, conjunctiva/corneas clear, EOM's intact, fundi    benign, both eyes  Ears:    Normal TM's and external ear canals, both ears  Nose:   Nares normal, septum midline, mucosa normal, no drainage    or sinus tenderness  Throat:   Lips, mucosa, and tongue normal; teeth and gums normal  Neck:   Supple, symmetrical, trachea  midline, no adenopathy;    thyroid:  no enlargement/tenderness/nodules; no carotid   bruit or JVD  Back:     Symmetric, no curvature, ROM normal, no CVA tenderness  Lungs:     Clear to auscultation bilaterally, respirations unlabored  Chest Wall:    No tenderness or deformity   Heart:    irreg irreg  Abdomen:     Soft, non-tender, bowel sounds active all four quadrants,    no masses, no organomegaly  Genitalia:   deferred  Rectal:   deferred  Extremities:   Extremities normal, atraumatic, no cyanosis or edema  Pulses:   2+ and symmetric all extremities  Skin:   Skin color, texture, turgor normal, no rashes or lesions  Lymph nodes:   Cervical, supraclavicular, and axillary nodes normal  Neurologic:   CNII-XII intact, normal strength, sensation and reflexes    throughout            Labs on Admission:  Basic Metabolic Panel:  Recent Labs Lab 04/05/14 1151 04/05/14 1437  NA 143 142  K 4.2 4.0  CL 108 107  CO2 26  --   GLUCOSE 95 92  BUN 20 20  CREATININE 0.80 0.80  CALCIUM 9.2  --    Liver Function Tests: No results found for this basename: AST, ALT, ALKPHOS, BILITOT, PROT, ALBUMIN,  in the last 168 hours No results found for this basename: LIPASE, AMYLASE,  in the last 168 hours No  results found for this basename: AMMONIA,  in the last 168 hours CBC:  Recent Labs Lab 04/05/14 1151 04/05/14 1437  WBC 4.4  --   HGB 12.8 13.9  HCT 40.4 41.0  MCV 97.3  --   PLT 151  --    Cardiac Enzymes:  Recent Labs Lab 04/05/14 1151  TROPONINI <0.30    BNP (last 3 results) No results found for this basename: PROBNP,  in the last 8760 hours CBG: No results found for this basename: GLUCAP,  in the last 168 hours  Radiological Exams on Admission: No results found.  EKG: atrial fibrillation  Assessment/Plan Principal Problem:   Syncope: related to bradycardia? Observe on tele. Decrease cardizem to 60 bid. Check ua. PT eval. Check orthostatics. Home in am if stable Active Problems:   Atrial fibrillation, chronic. Not on anticoagulation  Time spent: 60 min  [facility] Pager [phone]

## 2014-04-06 ENCOUNTER — Ambulatory Visit: Payer: Self-pay | Admitting: Nurse Practitioner

## 2014-04-06 LAB — BASIC METABOLIC PANEL
ANION GAP: 12 (ref 5–15)
BUN: 20 mg/dL (ref 6–23)
CALCIUM: 9.5 mg/dL (ref 8.4–10.5)
CHLORIDE: 107 meq/L (ref 96–112)
CO2: 23 mEq/L (ref 19–32)
CREATININE: 0.76 mg/dL (ref 0.50–1.10)
GFR, EST AFRICAN AMERICAN: 90 mL/min — AB (ref >90)
GFR, EST NON AFRICAN AMERICAN: 78 mL/min — AB (ref >90)
Glucose, Bld: 103 mg/dL — ABNORMAL HIGH (ref 70–99)
Potassium: 4.7 mEq/L (ref 3.7–5.3)
Sodium: 142 mEq/L (ref 137–147)

## 2014-04-06 MED ORDER — DILTIAZEM HCL 30 MG PO TABS: ORAL_TABLET | ORAL | Status: DC

## 2014-04-06 NOTE — Discharge Summary (Signed)
[facility]  Physician Discharge Summary   Patient ID: Monica Strong MRN: [id number] DOB/AGE: 78-Jul-1933 78 y.o.  Admit date: 04/05/2014 Discharge date: 04/06/2014  PCP: Vena Austria, MD  DISCHARGE DIAGNOSES:  Principal Problem:   Syncope Active Problems:   Atrial fibrillation   RECOMMENDATIONS FOR OUTPATIENT FOLLOW UP: 1. Home health PT/OT to be arranged.  2. Needs close follow up with PCP.  DISCHARGE CONDITION: fair  Diet recommendation: Heart healthy  Filed Weights   04/05/14 1805  Weight: 95.664 kg (210 lb 14.4 oz)    INITIAL HISTORY: Monica Strong is a 80 y.o. female with h/o atrial fibrillation, htn who presented to the ED from ALF with unresponsive episode. No definate LOC. Patient reported she felt weak and tired and laid her head on a table. ALF staff report patient was unresponsive, and had HR of 40 with normal blood pressure.   Consultations:  None  Procedures:  None. Recent ECHO report from September was reviewed.   HOSPITAL COURSE:   Syncope Possibly related to bradycardia which could have occurred to vagal stimuli as this occurred while patient was at breakfast. No further episodes. Patient feels well this morning. Tele shows Afib with mild RVR Blood work is unremarkable. UA was a contaminated sample. She does not have symptoms of UTI. Orthostatics were normal. She was seen by PT and home health was recommended.   Atrial fibrillation, chronic She is not on anticoagulation. HR has been around 100-115 here. After she received her Am dose it improved to 90-100. Will recommend a dose of 90mg  in Am and 60mg  in PM.   All other issues are stable. She can be discharged back to ALF. Discussed with her daughter as well.    PERTINENT LABS:  The results of significant diagnostics from this hospitalization (including imaging, microbiology, ancillary and laboratory) are listed below for reference.    Microbiology: No results found for  this or any previous visit (from the past 240 hour(s)).   Labs: Basic Metabolic Panel:  Recent Labs Lab 04/05/14 1151 04/05/14 1437 04/06/14 1126  NA 143 142 142  K 4.2 4.0 4.7  CL 108 107 107  CO2 26  --  23  GLUCOSE 95 92 103*  BUN 20 20 20   CREATININE 0.80 0.80 0.76  CALCIUM 9.2  --  9.5   CBC:  Recent Labs Lab 04/05/14 1151 04/05/14 1437  WBC 4.4  --   HGB 12.8 13.9  HCT 40.4 41.0  MCV 97.3  --   PLT 151  --    Cardiac Enzymes:  Recent Labs Lab 04/05/14 1151  TROPONINI <0.30    IMAGING STUDIES No results found.  DISCHARGE EXAMINATION: Filed Vitals:   04/05/14 2214 04/06/14 0500 04/06/14 1123 04/06/14 1218  BP: 116/65 103/66 121/72 117/74  Pulse: 74 75  94  Temp: 97.8 F (36.6 C) 97.7 F (36.5 C)    TempSrc: Oral Oral    Resp: 17 17    Height:      Weight:      SpO2: 96% 98%     General appearance: alert, cooperative, appears stated age and no distress Head: Normocephalic, without obvious abnormality, atraumatic Resp: clear to auscultation bilaterally Cardio: irregular rhythm, no murmur, click, rub or gallop GI: soft, non-tender; bowel sounds normal; no masses,  no organomegaly  DISPOSITION: ALF  Discharge Instructions   Diet - low sodium heart healthy    Complete by:  As directed      Discharge instructions  Complete by:  As directed   Follow up with your PCP within a week.     Increase activity slowly    Complete by:  As directed            ALLERGIES:  Allergies  Allergen Reactions  . Diphenhydramine Other (See Comments)    UNKNOWN  . Flagyl [Metronidazole] Other (See Comments)    HALLUCINATIONS  . Hydrocodone Other (See Comments)    HALLUCINATIONS  . Meperidine Hcl Nausea And Vomiting  . Penicillins Hives  . Sulfonamide Derivatives Hives    Current Discharge Medication List    CONTINUE these medications which have CHANGED   Details  diltiazem (CARDIZEM) 30 MG tablet Take 3 tablets (90 Mg) in the morning and 2  tablets (60 Mg) in the evening. Qty: 150 tablet, Refills: 2      CONTINUE these medications which have NOT CHANGED   Details  acetaminophen (TYLENOL) 325 MG tablet Take 650 mg by mouth every 6 (six) hours as needed for mild pain.    aspirin EC 325 MG tablet Take 325 mg by mouth daily.    Multiple Vitamins-Minerals (MULTIVITAMIN PO) Take 1 tablet by mouth daily.    omeprazole (PRILOSEC) 20 MG capsule Take 20 mg by mouth daily.    pramoxine-mineral oil-zinc (TUCKS) 1-12.5 % rectal ointment Place 1 application rectally every 4 (four) hours as needed for itching or irritation.    QUEtiapine (SEROQUEL XR) 300 MG 24 hr tablet Take 300 mg by mouth every evening.    senna-docusate (SENOKOT-S) 8.6-50 MG per tablet Take 1 tablet by mouth 2 (two) times daily.       Follow-up Information   Follow up with Vena Austria, MD. Schedule an appointment as soon as possible for a visit in 1 week. (post hospitalization follow up)    Specialty:  Family Medicine   Contact information:   [place] [address] [phone]       TOTAL DISCHARGE TIME: 21 mins  [facility] Pager [phone]  04/06/2014, 12:47 PM

## 2014-04-06 NOTE — Care Management Note (Signed)
    Page 1 of 1   04/06/2014     3:31:25 PM CARE MANAGEMENT NOTE 04/06/2014  Patient:  Monica Strong, Monica Strong   Account Number:  [id number]  Date Initiated:  04/06/2014  Documentation initiated by:  Rie Mcneil  Subjective/Objective Assessment:   Pt adm on 04/05/14 with unresponsiveness, bradycardia. PTA, pt resided at [facility] in [place].  She has supportive dtr at bedside.     Action/Plan:   CSW consulted to facilitate return to ALF.  She will need HH follow up at facility.  Referral to [facility] care, per pt/dtr choice.  Start of care 24-48h post dc date.   Anticipated DC Date:  04/06/2014   Anticipated DC Plan:  [place]  CM consult      [facility] Choice  HOME HEALTH   Choice offered to / List presented to:  C-1 Patient        [place] arranged  [place]   Status of service:  Completed, signed off Medicare Important Message given?  NA - LOS <3 / Initial given by admissions (If response is "NO", the following Medicare IM given date fields will be blank) Date Medicare IM given:   Medicare IM given by:   Date Additional Medicare IM given:   Additional Medicare IM given by:    Discharge Disposition:  [place]  Per UR Regulation:  Reviewed for med. necessity/level of care/duration of stay  If discussed at [place] of Stay Meetings, dates discussed:    Comments:  DC address:  [facility] [address] [place], [place] phone # [phone];     pt room # [phone]; daughter cell# [phone]

## 2014-04-06 NOTE — Discharge Instructions (Signed)
Syncope °Syncope is a medical term for fainting or passing out. This means you lose consciousness and drop to the ground. People are generally unconscious for less than 5 minutes. You may have some muscle twitches for up to 15 seconds before waking up and returning to normal. Syncope occurs more often in older adults, but it can happen to anyone. While most causes of syncope are not dangerous, syncope can be a sign of a serious medical problem. It is important to seek medical care.  °CAUSES  °Syncope is caused by a sudden drop in blood flow to the brain. The specific cause is often not determined. Factors that can bring on syncope include: °· Taking medicines that lower blood pressure. °· Sudden changes in posture, such as standing up quickly. °· Taking more medicine than prescribed. °· Standing in one place for too long. °· Seizure disorders. °· Dehydration and excessive exposure to heat. °· Low blood sugar (hypoglycemia). °· Straining to have a bowel movement. °· Heart disease, irregular heartbeat, or other circulatory problems. °· Fear, emotional distress, seeing blood, or severe pain. °SYMPTOMS  °Right before fainting, you may: °· Feel dizzy or light-headed. °· Feel nauseous. °· See all white or all black in your field of vision. °· Have cold, clammy skin. °DIAGNOSIS  °Your health care provider will ask about your symptoms, perform a physical exam, and perform an electrocardiogram (ECG) to record the electrical activity of your heart. Your health care provider may also perform other heart or blood tests to determine the cause of your syncope which may include: °· Transthoracic echocardiogram (TTE). During echocardiography, sound waves are used to evaluate how blood flows through your heart. °· Transesophageal echocardiogram (TEE). °· Cardiac monitoring. This allows your health care provider to monitor your heart rate and rhythm in real time. °· Holter monitor. This is a portable device that records your  heartbeat and can help diagnose heart arrhythmias. It allows your health care provider to track your heart activity for several days, if needed. °· Stress tests by exercise or by giving medicine that makes the heart beat faster. °TREATMENT  °In most cases, no treatment is needed. Depending on the cause of your syncope, your health care provider may recommend changing or stopping some of your medicines. °HOME CARE INSTRUCTIONS °· Have someone stay with you until you feel stable. °· Do not drive, use machinery, or play sports until your health care provider says it is okay. °· Keep all follow-up appointments as directed by your health care provider. °· Lie down right away if you start feeling like you might faint. Breathe deeply and steadily. Wait until all the symptoms have passed. °· Drink enough fluids to keep your urine clear or pale yellow. °· If you are taking blood pressure or heart medicine, get up slowly and take several minutes to sit and then stand. This can reduce dizziness. °SEEK IMMEDIATE MEDICAL CARE IF:  °· You have a severe headache. °· You have unusual pain in the chest, abdomen, or back. °· You are bleeding from your mouth or rectum, or you have black or tarry stool. °· You have an irregular or very fast heartbeat. °· You have pain with breathing. °· You have repeated fainting or seizure-like jerking during an episode. °· You faint when sitting or lying down. °· You have confusion. °· You have trouble walking. °· You have severe weakness. °· You have vision problems. °If you fainted, call your local emergency services (911 in [place] Do not drive   yourself to the hospital.  °MAKE SURE YOU: °· Understand these instructions. °· Will watch your condition. °· Will get help right away if you are not doing well or get worse. °Document Released: 05/28/2005 Document Revised: 06/02/2013 Document Reviewed: 07/27/2011 °[employer]® Patient Information ©2015 [employer]. This information is not intended to replace  advice given to you by your health care provider. Make sure you discuss any questions you have with your health care provider. ° °

## 2014-04-06 NOTE — Evaluation (Signed)
Physical Therapy Evaluation Patient Details Name: Monica Strong MRN: [id number] DOB: [birth year] Today's Date: 04/06/2014   History of Present Illness  With h/o atrial fibrillation, htn presents to ED from ALF with unresponsive episode. No definate LOC. Patient reports she felt weak and tired and laid her head on a table. ALF staff report patient was unresponsive, and had HR of 40 with normal blood pressure. Patient  Feels tired and weak currently. No f/c, dysuria, dyspnea, CP  Clinical Impression  Pt admitted with the above. Currently with functional limitations due to the deficits listed below (see PT Problem List). Ambulates generally well up to 100 feet with supervision; HR up to 129 during ambulatory bout no complaints of SOB or dizziness. Feel she would benefit HHPT at d/c due to generalized weakness. Will continue to follow acutely until medically ready for d/c.       Follow Up Recommendations Home health PT    Equipment Recommendations  None recommended by PT    Recommendations for Other Services       Precautions / Restrictions Precautions Precautions: None Restrictions Weight Bearing Restrictions: No      Mobility  Bed Mobility Overal bed mobility: Modified Independent             General bed mobility comments: Requires extra time  Transfers Overall transfer level: Needs assistance Equipment used: Rolling walker (2 wheeled) Transfers: Sit to/from Stand Sit to Stand: Supervision         General transfer comment: Supervision for safety, no loss of balance with RW for support and no dizziness reported.  Ambulation/Gait Ambulation/Gait assistance: Supervision Ambulation Distance (Feet): 100 Feet Assistive device:  (Held IV pole - did not wish to use RW) Gait Pattern/deviations: Step-through pattern;Decreased stride length Gait velocity: Decreased   General Gait Details: Slow, small steps but with good balance while holding IV pole; declined to use  rolling walker. HR up to 129 while ambulating per monitor.  No complaints of chest pain, SOB, or dizziness however did state her legs felt weak. No buckling noted during bout.  Stairs            Wheelchair Mobility    Modified Rankin (Stroke Patients Only)       Balance Overall balance assessment: Needs assistance Sitting-balance support: No upper extremity supported;Feet supported Sitting balance-Leahy Scale: Good     Standing balance support: No upper extremity supported Standing balance-Leahy Scale: Fair                               Pertinent Vitals/Pain Pain Assessment: No/denies pain Sitting: HR 123 SpO2 99% on room air             HR 109 after sitting for 1-2 minutes Ambulating: HR to 129 Sitting after ambulating: HR 106 SpO2 97% on room air    Home Living Family/patient expects to be discharged to:: Assisted living Living Arrangements: Alone Available Help at Discharge: Available 24 hours/day Type of Home: Assisted living Home Access: Level entry     Home Layout: One level Home Equipment: Cane - single point;Walker - 4 wheels      Prior Function Level of Independence: Independent with assistive device(s)         Comments: uses cane for ambulation     Hand Dominance   Dominant Hand: Right    Extremity/Trunk Assessment   Upper Extremity Assessment: Defer to OT evaluation  Lower Extremity Assessment: Generalized weakness         Communication   Communication: No difficulties  Cognition Arousal/Alertness: Awake/alert Behavior During Therapy: WFL for tasks assessed/performed Overall Cognitive Status: Within Functional Limits for tasks assessed                      General Comments      Exercises General Exercises - Lower Extremity Ankle Circles/Pumps: AROM;Both;10 reps;Seated      Assessment/Plan    PT Assessment Patient needs continued PT services  PT Diagnosis Generalized weakness   PT  Problem List Decreased strength;Decreased activity tolerance;Decreased balance;Decreased mobility;Decreased knowledge of use of DME;Cardiopulmonary status limiting activity  PT Treatment Interventions DME instruction;Gait training;Functional mobility training;Therapeutic activities;Therapeutic exercise;Balance training;Neuromuscular re-education;Patient/family education   PT Goals (Current goals can be found in the Care Plan section) Acute Rehab PT Goals Patient Stated Goal: Go back to ALF PT Goal Formulation: With patient Time For Goal Achievement: 04/13/14 Potential to Achieve Goals: Good    Frequency Min 3X/week   Barriers to discharge        Co-evaluation               End of Session Equipment Utilized During Treatment: Gait belt Activity Tolerance: Patient tolerated treatment well Patient left: in chair;with call bell/phone within reach Nurse Communication: Mobility status    Functional Assessment Tool Used: clinical observation Functional Limitation: Mobility: Walking and moving around Mobility: Walking and Moving Around Current Status (Q3009): At least 1 percent but less than 20 percent impaired, limited or restricted Mobility: Walking and Moving Around Goal Status [phone]): At least 1 percent but less than 20 percent impaired, limited or restricted    Time: 0932-0957 PT Time Calculation (min): 25 min   Charges:   PT Evaluation $Initial PT Evaluation Tier I: 1 Procedure PT Treatments $Therapeutic Activity: 8-22 mins   PT G Codes:   Functional Assessment Tool Used: clinical observation Functional Limitation: Mobility: Walking and moving around    [place], [place]  Ellouise Newer 04/06/2014, 10:24 AM

## 2014-04-06 NOTE — Progress Notes (Signed)
Pt assessment unchanged.  Pt and daughter received written and verbal discharge instructions.  Pt and daughter verbalized that they understand the discharge instructions.  Pt has possession of all personal belongings. Pt transported back to facility ([facility]) via EMS.    Johnson,Donovin Kraemer A  04/06/2014

## 2014-04-06 NOTE — Progress Notes (Signed)
UR completed 

## 2014-04-15 ENCOUNTER — Emergency Department (HOSPITAL_COMMUNITY): Payer: Medicare Other

## 2014-04-15 ENCOUNTER — Inpatient Hospital Stay (HOSPITAL_COMMUNITY)
Admission: EM | Admit: 2014-04-15 | Discharge: 2014-04-20 | DRG: 176 | Disposition: A | Discharge status: Nursing Home (Includes ICF) | Payer: Medicare Other | Attending: Family Medicine | Admitting: Family Medicine

## 2014-04-15 ENCOUNTER — Encounter (HOSPITAL_COMMUNITY): Payer: Self-pay | Admitting: Emergency Medicine

## 2014-04-15 DIAGNOSIS — R1012 Left upper quadrant pain: Secondary | ICD-10-CM | POA: Diagnosis not present

## 2014-04-15 DIAGNOSIS — F29 Unspecified psychosis not due to a substance or known physiological condition: Secondary | ICD-10-CM | POA: Diagnosis present

## 2014-04-15 DIAGNOSIS — I4891 Unspecified atrial fibrillation: Secondary | ICD-10-CM | POA: Diagnosis present

## 2014-04-15 DIAGNOSIS — I482 Chronic atrial fibrillation: Secondary | ICD-10-CM

## 2014-04-15 DIAGNOSIS — R918 Other nonspecific abnormal finding of lung field: Secondary | ICD-10-CM | POA: Diagnosis present

## 2014-04-15 DIAGNOSIS — R4020 Unspecified coma: Secondary | ICD-10-CM | POA: Diagnosis present

## 2014-04-15 DIAGNOSIS — Z7982 Long term (current) use of aspirin: Secondary | ICD-10-CM | POA: Diagnosis not present

## 2014-04-15 DIAGNOSIS — Z6839 Body mass index (BMI) 39.0-39.9, adult: Secondary | ICD-10-CM

## 2014-04-15 DIAGNOSIS — R109 Unspecified abdominal pain: Secondary | ICD-10-CM

## 2014-04-15 DIAGNOSIS — Z9109 Other allergy status, other than to drugs and biological substances: Secondary | ICD-10-CM | POA: Diagnosis not present

## 2014-04-15 DIAGNOSIS — D696 Thrombocytopenia, unspecified: Secondary | ICD-10-CM | POA: Diagnosis present

## 2014-04-15 DIAGNOSIS — K219 Gastro-esophageal reflux disease without esophagitis: Secondary | ICD-10-CM | POA: Diagnosis present

## 2014-04-15 DIAGNOSIS — R413 Other amnesia: Secondary | ICD-10-CM | POA: Diagnosis present

## 2014-04-15 DIAGNOSIS — I10 Essential (primary) hypertension: Secondary | ICD-10-CM | POA: Diagnosis present

## 2014-04-15 DIAGNOSIS — Z961 Presence of intraocular lens: Secondary | ICD-10-CM | POA: Diagnosis present

## 2014-04-15 DIAGNOSIS — Z882 Allergy status to sulfonamides status: Secondary | ICD-10-CM

## 2014-04-15 DIAGNOSIS — F0390 Unspecified dementia without behavioral disturbance: Secondary | ICD-10-CM | POA: Diagnosis present

## 2014-04-15 DIAGNOSIS — I2699 Other pulmonary embolism without acute cor pulmonale: Secondary | ICD-10-CM | POA: Diagnosis present

## 2014-04-15 DIAGNOSIS — Z880 Allergy status to penicillin: Secondary | ICD-10-CM

## 2014-04-15 DIAGNOSIS — R404 Transient alteration of awareness: Secondary | ICD-10-CM

## 2014-04-15 DIAGNOSIS — E669 Obesity, unspecified: Secondary | ICD-10-CM | POA: Diagnosis present

## 2014-04-15 DIAGNOSIS — Z96653 Presence of artificial knee joint, bilateral: Secondary | ICD-10-CM | POA: Diagnosis present

## 2014-04-15 DIAGNOSIS — G4733 Obstructive sleep apnea (adult) (pediatric): Secondary | ICD-10-CM | POA: Diagnosis present

## 2014-04-15 DIAGNOSIS — K449 Diaphragmatic hernia without obstruction or gangrene: Secondary | ICD-10-CM | POA: Diagnosis present

## 2014-04-15 DIAGNOSIS — R55 Syncope and collapse: Secondary | ICD-10-CM | POA: Diagnosis present

## 2014-04-15 HISTORY — DX: Essential (primary) hypertension: I10

## 2014-04-15 LAB — I-STAT TROPONIN, ED: Troponin i, poc: 0.01 ng/mL (ref 0.00–0.08)

## 2014-04-15 LAB — CREATININE, SERUM
Creatinine, Ser: 0.91 mg/dL (ref 0.50–1.10)
GFR calc Af Amer: 67 mL/min — ABNORMAL LOW (ref >90)
GFR calc non Af Amer: 58 mL/min — ABNORMAL LOW (ref >90)

## 2014-04-15 LAB — CBC
HEMATOCRIT: 38.7 % (ref 36.0–46.0)
Hemoglobin: 12.5 g/dL (ref 12.0–15.0)
MCH: 30.9 pg (ref 26.0–34.0)
MCHC: 32.3 g/dL (ref 30.0–36.0)
MCV: 95.6 fL (ref 78.0–100.0)
Platelets: 159 10*3/uL (ref 150–400)
RBC: 4.05 MIL/uL (ref 3.87–5.11)
RDW: 14 % (ref 11.5–15.5)
WBC: 4.1 10*3/uL (ref 4.0–10.5)

## 2014-04-15 LAB — URINALYSIS, ROUTINE W REFLEX MICROSCOPIC
Bilirubin Urine: NEGATIVE
GLUCOSE, UA: NEGATIVE mg/dL
Hgb urine dipstick: NEGATIVE
Ketones, ur: NEGATIVE mg/dL
LEUKOCYTES UA: NEGATIVE
Nitrite: NEGATIVE
PROTEIN: NEGATIVE mg/dL
SPECIFIC GRAVITY, URINE: 1.026 (ref 1.005–1.030)
UROBILINOGEN UA: 0.2 mg/dL (ref 0.0–1.0)
pH: 6.5 (ref 5.0–8.0)

## 2014-04-15 LAB — COMPREHENSIVE METABOLIC PANEL
ALBUMIN: 3.4 g/dL — AB (ref 3.5–5.2)
ALK PHOS: 76 U/L (ref 39–117)
ALT: 10 U/L (ref 0–35)
AST: 15 U/L (ref 0–37)
Anion gap: 11 (ref 5–15)
BILIRUBIN TOTAL: 0.4 mg/dL (ref 0.3–1.2)
BUN: 17 mg/dL (ref 6–23)
CHLORIDE: 108 meq/L (ref 96–112)
CO2: 27 mEq/L (ref 19–32)
Calcium: 9.4 mg/dL (ref 8.4–10.5)
Creatinine, Ser: 0.88 mg/dL (ref 0.50–1.10)
GFR calc Af Amer: 70 mL/min — ABNORMAL LOW (ref >90)
GFR calc non Af Amer: 60 mL/min — ABNORMAL LOW (ref >90)
Glucose, Bld: 104 mg/dL — ABNORMAL HIGH (ref 70–99)
POTASSIUM: 4.1 meq/L (ref 3.7–5.3)
Sodium: 146 mEq/L (ref 137–147)
Total Protein: 6.2 g/dL (ref 6.0–8.3)

## 2014-04-15 LAB — CBC WITH DIFFERENTIAL/PLATELET
BASOS ABS: 0 10*3/uL (ref 0.0–0.1)
BASOS PCT: 1 % (ref 0–1)
Eosinophils Absolute: 0.1 10*3/uL (ref 0.0–0.7)
Eosinophils Relative: 2 % (ref 0–5)
HCT: 42.1 % (ref 36.0–46.0)
Hemoglobin: 13.7 g/dL (ref 12.0–15.0)
Lymphocytes Relative: 30 % (ref 12–46)
Lymphs Abs: 1.3 10*3/uL (ref 0.7–4.0)
MCH: 31.1 pg (ref 26.0–34.0)
MCHC: 32.5 g/dL (ref 30.0–36.0)
MCV: 95.7 fL (ref 78.0–100.0)
Monocytes Absolute: 0.4 10*3/uL (ref 0.1–1.0)
Monocytes Relative: 9 % (ref 3–12)
NEUTROS ABS: 2.5 10*3/uL (ref 1.7–7.7)
NEUTROS PCT: 58 % (ref 43–77)
PLATELETS: 145 10*3/uL — AB (ref 150–400)
RBC: 4.4 MIL/uL (ref 3.87–5.11)
RDW: 13.9 % (ref 11.5–15.5)
WBC: 4.3 10*3/uL (ref 4.0–10.5)

## 2014-04-15 LAB — CBG MONITORING, ED: GLUCOSE-CAPILLARY: 84 mg/dL (ref 70–99)

## 2014-04-15 LAB — TROPONIN I: Troponin I: <0.3 ng/mL (ref <0.30)

## 2014-04-15 MED ORDER — ONDANSETRON HCL 4 MG/2ML IJ SOLN: 4.0000 mg | Freq: Four times a day (QID) | INTRAMUSCULAR | Status: DC | PRN

## 2014-04-15 MED ORDER — DILTIAZEM HCL 90 MG PO TABS
90.0000 mg | ORAL_TABLET | Freq: Every morning | ORAL | Status: DC
  Administered 2014-04-16 – 2014-04-17 (×2): 90 mg via ORAL
  Filled 2014-04-15 (×2): qty 1

## 2014-04-15 MED ORDER — IOHEXOL 300 MG/ML  SOLN
100.0000 mL | Freq: Once | INTRAMUSCULAR | Status: AC | PRN
  Administered 2014-04-15: 100 mL via INTRAVENOUS

## 2014-04-15 MED ORDER — SODIUM CHLORIDE 0.9 % IJ SOLN
3.0000 mL | Freq: Two times a day (BID) | INTRAMUSCULAR | Status: DC
  Administered 2014-04-15 – 2014-04-20 (×7): 3 mL via INTRAVENOUS

## 2014-04-15 MED ORDER — SODIUM CHLORIDE 0.9 % IV SOLN: 250.0000 mL | INTRAVENOUS | Status: DC | PRN

## 2014-04-15 MED ORDER — IOHEXOL 300 MG/ML  SOLN
25.0000 mL | Freq: Once | INTRAMUSCULAR | Status: AC | PRN
  Administered 2014-04-15: 25 mL via ORAL

## 2014-04-15 MED ORDER — PANTOPRAZOLE SODIUM 40 MG PO TBEC
40.0000 mg | DELAYED_RELEASE_TABLET | Freq: Every day | ORAL | Status: DC
  Administered 2014-04-15 – 2014-04-20 (×6): 40 mg via ORAL
  Filled 2014-04-15 (×6): qty 1

## 2014-04-15 MED ORDER — DILTIAZEM HCL 60 MG PO TABS
60.0000 mg | ORAL_TABLET | Freq: Every evening | ORAL | Status: DC
  Administered 2014-04-15 – 2014-04-16 (×2): 60 mg via ORAL
  Filled 2014-04-15 (×4): qty 1

## 2014-04-15 MED ORDER — ONDANSETRON HCL 4 MG PO TABS: 4.0000 mg | ORAL_TABLET | Freq: Four times a day (QID) | ORAL | Status: DC | PRN

## 2014-04-15 MED ORDER — ACETAMINOPHEN 325 MG PO TABS: 650.0000 mg | ORAL_TABLET | Freq: Four times a day (QID) | ORAL | Status: DC | PRN

## 2014-04-15 MED ORDER — ENOXAPARIN SODIUM 40 MG/0.4ML ~~LOC~~ SOLN
40.0000 mg | SUBCUTANEOUS | Status: DC
  Administered 2014-04-15: 40 mg via SUBCUTANEOUS
  Filled 2014-04-15 (×2): qty 0.4

## 2014-04-15 MED ORDER — LORAZEPAM 2 MG/ML IJ SOLN: 2.0000 mg | INTRAMUSCULAR | Status: DC | PRN

## 2014-04-15 MED ORDER — ASPIRIN EC 325 MG PO TBEC
325.0000 mg | DELAYED_RELEASE_TABLET | Freq: Every day | ORAL | Status: DC
Start: 2014-04-15 — End: 2014-04-16
  Administered 2014-04-15 – 2014-04-16 (×2): 325 mg via ORAL
  Filled 2014-04-15 (×2): qty 1

## 2014-04-15 MED ORDER — ACETAMINOPHEN 650 MG RE SUPP: 650.0000 mg | Freq: Four times a day (QID) | RECTAL | Status: DC | PRN

## 2014-04-15 MED ORDER — QUETIAPINE FUMARATE ER 400 MG PO TB24
400.0000 mg | ORAL_TABLET | Freq: Every evening | ORAL | Status: DC
  Administered 2014-04-15 – 2014-04-17 (×3): 400 mg via ORAL
  Filled 2014-04-15 (×5): qty 1

## 2014-04-15 MED ORDER — FENTANYL CITRATE 0.05 MG/ML IJ SOLN
50.0000 ug | Freq: Once | INTRAMUSCULAR | Status: AC
  Administered 2014-04-15: 50 ug via INTRAVENOUS
  Filled 2014-04-15: qty 2

## 2014-04-15 MED ORDER — SODIUM CHLORIDE 0.9 % IJ SOLN: 3.0000 mL | INTRAMUSCULAR | Status: DC | PRN

## 2014-04-15 NOTE — Consult Note (Signed)
Reason for Consult: recurrence of loss of consciousness  HPI:                                                                                                                                          Monica Strong is an 80 y.o. female with recent admission following syncopal episode, as well as a history of atrial fibrillation, obstructive sleep apnea, obesity, chronic paranoia, and dementia, who was brought to the emergency room following an episode of loss of consciousness earlier today. Patient was noted to be slumped over in a chair at the assisted living facility where she lives. She was also noted to be confused on awakening. No seizure activity reported. Patient reportedly had bradycardia with heart rate of 40. Patient is not on anticoagulation and takes aspirin 325 mg per day. She has remained alert and in a sponsor since EMTs arrived on the scene. Routine laboratory studies today were unremarkable, including urinalysis. Patient has been afebrile.  Past Medical History  Diagnosis Date  . Renal stones left  . GERD (gastroesophageal reflux disease)   . H/O hiatal hernia   . Pulmonary nodules BENIGN--  MONITORED BY PCP DR READE--  ASYMPTOMATIC     LAST CHEST CT 08-17-2011  . Heart murmur MILD -- ASYMPTOMATIC  . Urge urinary incontinence   . Sinus drainage   . Neuromuscular disorder     rt hand numbness  . Dysuria     has urethral bump  . Ureteral stent retained   . Arthritis   . OSA (obstructive sleep apnea)     not using cpap  . Paranoia   . Obesity   . Chest pain     a. Normal stress test 08/2008 and CTA neg for PE at that time.  Jola [facility] ear dysfunction     a. Prior h/o vertigo.  Marland Kitchen History of infection due to ESBL Escherichia coli 02/23/2014    Past Surgical History  Procedure Laterality Date  . Total knee arthroplasty  10-17-2009  DR ALUISIO    LEFT  . Right total knee arthroplasty  04-25-2009  . Tonsillectomy    . Cardiovascular stress test  03-13-2007  dr Tressia Miners turner     NORMAL LV SIZE SYSTOLIC FUCTION/ NO ISCHEMIA/ EF 85%  . Transthoracic echocardiogram  08-18-2008    NORMAL LVSF/ EF 55-60%/ MILD MITRAL REGURG .  Marland Kitchen Removal breast cyst, benign  1960'S  . Vaginal hysterectomy  1970  . Cataract extraction w/ intraocular lens  implant, bilateral    . Blepharoplasty      BILATERAL  . Cystoscopy with retrograde pyelogram, ureteroscopy and stent placement  05/14/2012    Procedure: [place], URETEROSCOPY AND STENT PLACEMENT;  Surgeon: Alexis Frock, MD;  Location: [facility];  Service: Urology;  Laterality: Left;  1ST STAGE LEFT URETEROSCOPY, LEFT RETROGRADE, DIGITAL URETEROSCOPY,  LEFT STONE REMOVAL WITH  ESCAPE BASKET,  STENT PLACEMENt   . Holmium laser application  08/13/7423    Procedure: HOLMIUM LASER APPLICATION;  Surgeon: Alexis Frock, MD;  Location: [facility];  Service: Urology;  Laterality: Left;  . Holmium laser application  02/13/6386    Procedure: HOLMIUM LASER APPLICATION;  Surgeon: Alexis Frock, MD;  Location: [facility];  Service: Urology;  Laterality: Left;  . Cystoscopy/retrograde/ureteroscopy/stone extraction with basket  06/13/2012    Procedure: CYSTOSCOPY/RETROGRADE/URETEROSCOPY/STONE EXTRACTION WITH BASKET;  Surgeon: Alexis Frock, MD;  Location[facility];  Service: Urology;  Laterality: Left;  . Cystoscopy w/ ureteral stent placement  06/13/2012    Procedure: CYSTOSCOPY WITH STENT REPLACEMENT;  Surgeon: Alexis Frock, MD;  Location: [facility];  Service: Urology;  Laterality: Left;  . Appendectomy      PT STATES PER XRAY SMALL AMOUNT OF APPENDIX LEFT  . Cystoscopy w/ ureteral stent removal  07/11/2012    Procedure: CYSTOSCOPY WITH STENT REMOVAL;  Surgeon: Alexis Frock, MD;  Location: [facility];  Service: Urology;  Laterality: Left;  . Cystoscopy w/ retrogrades  07/11/2012    Procedure: CYSTOSCOPY WITH RETROGRADE  PYELOGRAM;  Surgeon: Alexis Frock, MD;  Location: [facility];  Service: Urology;  Laterality: Left;  left third stage ureteroscopystone manipulation  . Ureteroscopy  07/11/2012    Procedure: URETEROSCOPY;  Surgeon: Alexis Frock, MD;  Location: [facility];  Service: Urology;  Laterality: Left;    Family History  Problem Relation Age of Onset  . Other      Pt unaware due to psych condition    Social History:  reports that she has never smoked. She has never used smokeless tobacco. She reports that she does not drink alcohol or use illicit drugs.  Allergies  Allergen Reactions  . Diphenhydramine Other (See Comments)    UNKNOWN  . Flagyl [Metronidazole] Other (See Comments)    HALLUCINATIONS  . Hydrocodone Other (See Comments)    HALLUCINATIONS  . Meperidine Hcl Nausea And Vomiting  . Penicillins Hives  . Sulfonamide Derivatives Hives    MEDICATIONS:                                                                                                                     I have reviewed the patient's current medications.   ROS:  History obtained from patient's daughter.  General ROS: negative for - chills, fatigue, fever, night sweats, weight gain or weight loss Psychological ROS: positive for paranoid ideations intermittently Ophthalmic ROS: negative for - blurry vision, double vision, eye pain or loss of vision ENT ROS: negative for - epistaxis, nasal discharge, oral lesions, sore throat, tinnitus or vertigo Allergy and Immunology ROS: negative for - hives or itchy/watery eyes Hematological and Lymphatic ROS: negative for - bleeding problems, bruising or swollen lymph nodes Endocrine ROS: negative for - galactorrhea, hair pattern changes, polydipsia/polyuria or temperature intolerance Respiratory ROS: negative for -  cough, hemoptysis, shortness of breath or wheezing Cardiovascular ROS: negative for - chest pain, dyspnea on exertion, edema or irregular heartbeat Gastrointestinal ROS: negative for - abdominal pain, diarrhea, hematemesis, nausea/vomiting or stool incontinence Genito-Urinary ROS: negative for - dysuria, hematuria, incontinence or urinary frequency/urgency Musculoskeletal ROS: negative for - joint swelling or muscular weakness Neurological ROS: as noted in HPI Dermatological ROS: negative for rash and skin lesion changes   Blood pressure 156/105, pulse 84, temperature 98.2 F (36.8 C), temperature source Oral, resp. rate 11, height 5\' 2"  (1.575 m), weight 95.255 kg (210 lb), SpO2 98 %.   Neurologic Examination:                                                                                                      Mental Status: Alert, oriented to person place and time, no acute distress.  Speech fluent without evidence of aphasia. Able to follow commands without difficulty. Cranial Nerves: II-Visual fields were normal. III/IV/VI-Pupils were equal and reacted. Extraocular movements were full and conjugate.    V/VII-no facial numbness and no facial weakness. VIII-normal. X-normal speech and symmetrical palatal movement. Motor: 5/5 bilaterally with normal tone and bulk Sensory: Normal throughout. Deep Tendon Reflexes: absent in lower extremities. Plantars: Flexor bilaterally Cerebellar: Normal finger-to-nose testing.  Lab Results  Component Value Date/Time   CHOL  07/16/2008 04:20 AM    195        ATP III CLASSIFICATION:  <200     mg/dL   Desirable  200-239  mg/dL   Borderline High  >=240    mg/dL   High           Results for orders placed or performed during the hospital encounter of 04/15/14 (from the past 48 hour(s))  CBC WITH DIFFERENTIAL     Status: Abnormal   Collection Time: 04/15/14  9:50 AM  Result Value Ref Range   WBC 4.3 4.0 - 10.5 K/uL   RBC 4.40 3.87 - 5.11 MIL/uL    Hemoglobin 13.7 12.0 - 15.0 g/dL   HCT 42.1 36.0 - 46.0 %   MCV 95.7 78.0 - 100.0 fL   MCH 31.1 26.0 - 34.0 pg   MCHC 32.5 30.0 - 36.0 g/dL   RDW 13.9 11.5 - 15.5 %   Platelets 145 (L) 150 - 400 K/uL   Neutrophils Relative % 58 43 - 77 %   Neutro Abs 2.5 1.7 - 7.7 K/uL   Lymphocytes Relative 30 12 - 46 %  Lymphs Abs 1.3 0.7 - 4.0 K/uL   Monocytes Relative 9 3 - 12 %   Monocytes Absolute 0.4 0.1 - 1.0 K/uL   Eosinophils Relative 2 0 - 5 %   Eosinophils Absolute 0.1 0.0 - 0.7 K/uL   Basophils Relative 1 0 - 1 %   Basophils Absolute 0.0 0.0 - 0.1 K/uL  I-stat troponin, ED (not at [facility])     Status: None   Collection Time: 04/15/14  9:56 AM  Result Value Ref Range   Troponin i, poc 0.01 0.00 - 0.08 ng/mL   Comment 3            Comment: Due to the release kinetics of cTnI, a negative result within the first hours of the onset of symptoms does not rule out myocardial infarction with certainty. If myocardial infarction is still suspected, repeat the test at appropriate intervals.     No results found.   Assessment/Plan: 80 year old lady with atrial fibrillation presenting with recurrent loss of consciousness, most likely syncopal in etiology, particularly with bradycardia and no witness seizure activity. Confusion on regaining consciousness may be a manifestation of dementia.  Recommendations: 1. EEG, routine adult study 2. CT scan of the head without contrast 3. Management of atrial fibrillation per primary care team  C.R. Nicole Kindred, MD [facility] [phone]  04/15/2014, 10:36 AM

## 2014-04-15 NOTE — H&P (Signed)
PCP:   Vena Austria, MD   Chief Complaint:  Passed out  HPI:  80 year old female who  has a past medical history of Renal stones (left); GERD (gastroesophageal reflux disease); H/O hiatal hernia; Pulmonary nodules (BENIGN--  MONITORED BY PCP DR READE--  ASYMPTOMATIC ); Heart murmur (MILD -- ASYMPTOMATIC); Urge urinary incontinence; Sinus drainage; Neuromuscular disorder; Dysuria; Ureteral stent retained; Arthritis; OSA (obstructive sleep apnea); Paranoia; Obesity; Chest pain; Inner ear dysfunction; History of infection due to ESBL Escherichia coli (02/23/2014); and Hypertension. Today was brought to the hospital by EMS after patient was found to be unresponsive at the assisted living facility. Patient is a poor historian, but remembers she was about to get the phone and after that everything went blank. There was no seizure-like activity noted by the nursing staff, she was found to be slumped in the chair. Patient was unresponsive for about 10 minutes, though there was no loss of bowel or bladder control, no biting of tongue. In the ED patient was seen by neurology who recommended patient to be admitted for further evaluation including EEG.she also complained of left-sided abdominal pain,CT abdomen pelvis was done which showed interval nodules in the right middle lobe and right upper lobe suspicious for malignancy. She denies chest pain, no shortness of breath no nausea vomiting or diarrhea. She denies palpitations no fever no dysuria urgency or frequency of urination.  Allergies:   Allergies  Allergen Reactions  . Diphenhydramine Other (See Comments)    UNKNOWN  . Flagyl [Metronidazole] Other (See Comments)    HALLUCINATIONS  . Hydrocodone Other (See Comments)    HALLUCINATIONS  . Meperidine Hcl Nausea And Vomiting  . Penicillins Hives  . Sulfonamide Derivatives Hives      Past Medical History  Diagnosis Date  . Renal stones left  . GERD (gastroesophageal reflux disease)    . H/O hiatal hernia   . Pulmonary nodules BENIGN--  MONITORED BY PCP DR READE--  ASYMPTOMATIC     LAST CHEST CT 08-17-2011  . Heart murmur MILD -- ASYMPTOMATIC  . Urge urinary incontinence   . Sinus drainage   . Neuromuscular disorder     rt hand numbness  . Dysuria     has urethral bump  . Ureteral stent retained   . Arthritis   . OSA (obstructive sleep apnea)     not using cpap  . Paranoia   . Obesity   . Chest pain     a. Normal stress test 08/2008 and CTA neg for PE at that time.  Jola Baptist ear dysfunction     a. Prior h/o vertigo.  Marland Kitchen History of infection due to ESBL Escherichia coli 02/23/2014  . Hypertension     Past Surgical History  Procedure Laterality Date  . Total knee arthroplasty  10-17-2009  DR ALUISIO    LEFT  . Right total knee arthroplasty  04-25-2009  . Tonsillectomy    . Cardiovascular stress test  03-13-2007  dr Tressia Miners turner    NORMAL LV SIZE SYSTOLIC FUCTION/ NO ISCHEMIA/ EF 85%  . Transthoracic echocardiogram  08-18-2008    NORMAL LVSF/ EF 55-60%/ MILD MITRAL REGURG .  Marland Kitchen Removal breast cyst, benign  1960'S  . Vaginal hysterectomy  1970  . Cataract extraction w/ intraocular lens  implant, bilateral    . Blepharoplasty      BILATERAL  . Cystoscopy with retrograde pyelogram, ureteroscopy and stent placement  05/14/2012    Procedure: CYSTOSCOPY WITH RETROGRADE PYELOGRAM, URETEROSCOPY AND STENT PLACEMENT;  Surgeon: Alexis Frock, MD;  Location: [facility];  Service: Urology;  Laterality: Left;  1ST STAGE LEFT URETEROSCOPY, LEFT RETROGRADE, DIGITAL URETEROSCOPY,  LEFT STONE REMOVAL WITH ESCAPE BASKET,  STENT PLACEMENt   . Holmium laser application  40/02/8118    Procedure: HOLMIUM LASER APPLICATION;  Surgeon: Alexis Frock, MD;  Location: [facility];  Service: Urology;  Laterality: Left;  . Holmium laser application  06/15/7827    Procedure: HOLMIUM LASER APPLICATION;  Surgeon: Alexis Frock, MD;  Location: [facility];  Service: Urology;  Laterality: Left;  . Cystoscopy/retrograde/ureteroscopy/stone extraction with basket  06/13/2012    Procedure: CYSTOSCOPY/RETROGRADE/URETEROSCOPY/STONE EXTRACTION WITH BASKET;  Surgeon: Alexis Frock, MD;  Location: [facility];  Service: Urology;  Laterality: Left;  . Cystoscopy w/ ureteral stent placement  06/13/2012    Procedure: CYSTOSCOPY WITH STENT REPLACEMENT;  Surgeon: Alexis Frock, MD;  Location: [facility];  Service: Urology;  Laterality: Left;  . Appendectomy      PT STATES PER XRAY SMALL AMOUNT OF APPENDIX LEFT  . Cystoscopy w/ ureteral stent removal  07/11/2012    Procedure: CYSTOSCOPY WITH STENT REMOVAL;  Surgeon: Alexis Frock, MD;  Location: [facility];  Service: Urology;  Laterality: Left;  . Cystoscopy w/ retrogrades  07/11/2012    Procedure: CYSTOSCOPY WITH RETROGRADE PYELOGRAM;  Surgeon: Alexis Frock, MD;  Location: [facility];  Service: Urology;  Laterality: Left;  left third stage ureteroscopystone manipulation  . Ureteroscopy  07/11/2012    Procedure: URETEROSCOPY;  Surgeon: Alexis Frock, MD;  Location: [facility];  Service: Urology;  Laterality: Left;    Prior to Admission medications   Medication Sig Start Date End Date Taking? Authorizing Provider  aspirin EC 325 MG tablet Take 325 mg by mouth daily.   Yes Historical Provider, MD  diltiazem (CARDIZEM) 30 MG tablet Take 3 tablets (90 Mg) in the morning and 2 tablets (60 Mg) in the evening. 04/06/14  Yes Bonnielee Haff, MD  Multiple Vitamins-Minerals (MULTIVITAMIN PO) Take 1 tablet by mouth daily.   Yes Historical Provider, MD  omeprazole (PRILOSEC) 20 MG capsule Take 20 mg by mouth daily.   Yes Historical Provider, MD  QUEtiapine (SEROQUEL XR) 400 MG 24 hr tablet Take 400 mg by mouth every evening.   Yes Historical Provider, MD  senna-docusate (SENOKOT-S) 8.6-50 MG per tablet Take 1 tablet by mouth 2 (two) times  daily.   Yes Historical Provider, MD  acetaminophen (TYLENOL) 325 MG tablet Take 650 mg by mouth every 6 (six) hours as needed for mild pain.    Historical Provider, MD  pramoxine-mineral oil-zinc (TUCKS) 1-12.5 % rectal ointment Place 1 application rectally every 4 (four) hours as needed for itching or irritation.    Historical Provider, MD  QUEtiapine (SEROQUEL XR) 300 MG 24 hr tablet Take 300 mg by mouth every evening.    Historical Provider, MD    Social History:  reports that she has never smoked. She has never used smokeless tobacco. She reports that she does not drink alcohol or use illicit drugs.  Family History  Problem Relation Age of Onset  . Other      Pt unaware due to psych condition     All the positives are listed in BOLD  Review of Systems:  HEENT: Headache, blurred vision, runny nose, sore throat Neck: Hypothyroidism, hyperthyroidism,,lymphadenopathy Chest : Shortness of breath, history of COPD, Asthma Heart : Chest pain, history of coronary arterey disease  GI:  Nausea, vomiting, diarrhea, constipation, GERD GU: Dysuria, urgency, frequency of urination, hematuria Neuro: Stroke, seizures, syncope Psych: Depression, anxiety, hallucinations   Physical Exam: Blood pressure 106/93, pulse 96, temperature 98.2 F (36.8 C), temperature source Oral, resp. rate 15, height 5\' 2"  (1.575 m), weight 95.255 kg (210 lb), SpO2 96 %. Constitutional:   Patient is a well-developed and well-nourished *Female in no acute distress and cooperative with exam. Head: Normocephalic and atraumatic Mouth: Mucus membranes moist Eyes: PERRL, EOMI, conjunctivae normal Neck: Supple, No Thyromegaly Cardiovascular: RRR, S1 normal, S2 normal Pulmonary/Chest: CTAB, no wheezes, rales, or rhonchi Abdominal: Soft. Non-tender, non-distended, bowel sounds are normal, no masses, organomegaly, or guarding present.  Neurological: A&O x3, Strenght is normal and symmetric bilaterally, cranial nerve II-XII  are grossly intact, no focal motor deficit, sensory intact to light touch bilaterally.  Extremities : No Cyanosis, Clubbing or Edema  Labs on Admission:  Basic Metabolic Panel:  Recent Labs Lab 04/15/14 0950  NA 146  K 4.1  CL 108  CO2 27  GLUCOSE 104*  BUN 17  CREATININE 0.88  CALCIUM 9.4   Liver Function Tests:  Recent Labs Lab 04/15/14 0950  AST 15  ALT 10  ALKPHOS 76  BILITOT 0.4  PROT 6.2  ALBUMIN 3.4*   No results for input(s): LIPASE, AMYLASE in the last 168 hours. No results for input(s): AMMONIA in the last 168 hours. CBC:  Recent Labs Lab 04/15/14 0950 04/15/14 1649  WBC 4.3 4.1  NEUTROABS 2.5  --   HGB 13.7 12.5  HCT 42.1 38.7  MCV 95.7 95.6  PLT 145* 159   Cardiac Enzymes:  Recent Labs Lab 04/15/14 0950  TROPONINI <0.30    BNP (last 3 results) No results for input(s): PROBNP in the last 8760 hours. CBG:  Recent Labs Lab 04/15/14 1113  GLUCAP 84    Radiological Exams on Admission: Dg Chest 2 View  04/15/2014   CLINICAL DATA:  Loss of consciousness earlier today. One day history of left-sided chest pain. Non smoker.  EXAM: CHEST  2 VIEW  COMPARISON:  Portable chest x-ray 02/16/2014. CT chest 08/22/2012, 08/22/2011, 08/31/2010, 03/10/2010.  FINDINGS: Suboptimal inspiration due to body habitus accounts for crowded bronchovascular markings, especially in the bases, and accentuates the cardiac silhouette. Taking this into account, cardiac silhouette moderately enlarged but stable. Thoracic aorta tortuous in a atherosclerotic, unchanged. Hilar and mediastinal contours otherwise unremarkable. Vague oval-shaped nodule in the right middle lobe, unchanged from the prior CTs. Linear scar or atelectasis in the lingula. Lungs otherwise clear. Bronchovascular markings normal. Pulmonary vascularity normal. No visible pleural effusions. No pneumothorax. Generalized osseous demineralization and spondylosis throughout the thoracic spine.  IMPRESSION: 1.  Linear atelectasis or scar in the lingula. No acute cardiopulmonary disease otherwise. 2. Stable oval-shaped nodule in the right middle lobe dating back to 2011, indicating benignity. 3. Stable moderate cardiomegaly without pulmonary edema.   Electronically Signed   By: Evangeline Dakin M.D.   On: 04/15/2014 10:43   Ct Abdomen Pelvis W Contrast  04/15/2014   CLINICAL DATA:  Intermittent left upper quadrant abdominal pain for several days.  EXAM: CT ABDOMEN AND PELVIS WITH CONTRAST  TECHNIQUE: Multidetector CT imaging of the abdomen and pelvis was performed using the standard protocol following bolus administration of intravenous contrast.  CONTRAST:  152mL OMNIPAQUE IOHEXOL 300 MG/ML  SOLN  COMPARISON:  Abdomen radiographs dated 03/09/2013. Abdomen and pelvis CT dated 04/11/2012.  FINDINGS: Multiple liver cysts are again demonstrated. Bilateral parapelvic renal cysts. No  hydronephrosis. Unremarkable spleen, pancreas, gallbladder, adrenal glands, ureters and urinary bladder. Surgically absent uterus. No adnexal masses enlarged lymph nodes.  Interval small amount of linear atelectasis or scarring at the right lung base. Interval oval nodule in the right middle lobe measuring 1.3 cm on image 8. There is also a 4 mm semi-solid nodule in the right upper lobe on image number 2 and a 4 mm nodule in the anterior medial right middle lobe on image 8. Decreased amount of pericardial with a small amount of fluid remaining, measuring 7 mm in maximum thickness.  Stool throughout the majority of the colon. No gastric or small bowel abnormalities. Moderate sized hiatal hernia containing fat without herniated stomach. Bilateral pelvic phleboliths.  IMPRESSION: 1. Interval nodules in the right middle lobe and right upper lobe suspicious for malignancy or a metastatic process, as described above. These could be further characterized with PET-CT if clinically indicated. 2. Minimal pericardial effusion, improved.   Electronically  Signed   By: Enrique Sack M.D.   On: 04/15/2014 13:31    EKG: Independently reviewed. Atrial fibrillation   Assessment/Plan Active Problems:   Atrial fibrillation   Syncope   Thrombocytopenia   LOC (loss of consciousness)  Syncope versus seizure We'll admit the patient and observe under telemetry. Will obtain EEG as per neurology recommendation. We'll start Ativan 2 g IV every 4 hours when necessary for seizure.  Lung mass CT abdomen pelvis reveals right middle lobe and right upper lobe nodules suspicious for malignancy, will obtain CT chest with contrast in a.m. to rule out pulmonary embolism as well as any other metastatic nodules and lymph nodes involvement.  Atrial fibrillation Heart rate is controlled continue Cardizem. Patient is not on anticoagulation. Continue aspirin 325 mg by mouth daily.  DVT prophylaxis Lovenox  Code status:patient is full code  Family discussion: Admission, patients condition and plan of care including tests being ordered have been discussed with the patient and *her daughter at bedside* who indicate understanding and agree with the plan and Code Status.   Time Spent on Admission: 65 min  [place] [facility] Pager: [phone] 04/15/2014, 5:37 PM  If 7PM-7AM, please contact night-coverage  [email or web address]  Password [email or web address]

## 2014-04-15 NOTE — ED Provider Notes (Signed)
Medical screening examination/treatment/procedure(s) were conducted as a shared visit with non-physician practitioner(s) and myself.  I personally evaluated the patient during the encounter.   EKG Interpretation   Date/Time:  Thursday April 15 2014 09:18:56 EST Ventricular Rate:  104 PR Interval:    QRS Duration: 92 QT Interval:  331 QTC Calculation: 435 R Axis:   57 Text Interpretation:  Atrial fibrillation Repol abnrm suggests ischemia,  diffuse leads Confirmed by WARD,  DO, KRISTEN ([id number]) on 04/15/2014  10:44:40 AM      Pt is a 80 y.o. with h/o a fib who presents to the emergency department with a syncopal event versus seizure. Patient had an episode of unresponsiveness for over 10 minutes and then was confused for several minutes afterwards. She is not her baseline. She is complaining of intermittent sharp left upper quadrant and left lower chest pain that is reproducible with palpation of her abdomen. No shortness of breath. She states she does not number any preceding symptoms prior to this episode. She is neurologically intact currently. We'll obtain cardiac workup and also discuss with neuro hospitalist. Anticipate admission.  [place], DO 04/15/14 [phone]

## 2014-04-15 NOTE — ED Notes (Signed)
From [place]; staff found her unresponsive and slumped over in her chair; she aroused to stimulation, but was very sluggish and confused. EMS arrived to find her alert, but sluggish to answer; in transport mentation improved and she was adamant she did not want to come to hospital. She reports pain in the left flank, she states she does not remember what happened this morning.

## 2014-04-15 NOTE — ED Notes (Signed)
Patient returned from CT

## 2014-04-15 NOTE — ED Provider Notes (Addendum)
CSN: [id number]     Arrival date & time 04/15/14  0917 History   First MD Initiated Contact with Patient 04/15/14 [phone]     Chief Complaint  Patient presents with  . Loss of Consciousness     (Consider location/radiation/quality/duration/timing/severity/associated sxs/prior Treatment) Patient is a 80 y.o. female presenting with syncope and altered mental status.  Loss of Consciousness Altered Mental Status  Garielle L Curbow is a(n) 80 y.o. female who presents from [place] facility. The patient is unable to remember the event that occurred this morning. History is gathered from nursing facility staff. Patient was found in room unresponsive. She did not arouse to voice or stimuli. The patient was placed on the ground and began to arouse when EMS arrived. She had a period of confusion following. Upon my examination the patient is complaining of pain in the left upper quadrant of the abdomen. She states that it is constant. Improving. She has a history of previous syncopal episodes and was admitted on 04/05/2014 for a similar episode. She has a past medical history ofobstructive sleep apnea, paranoia, chest pain in ago, chronic A. Fib. She is not on any anticoagulants.the patient denies any recent injuries, illnesses, fevers, chills, dysuria, frequency or urgency symptoms. She denies any shortness of breath, nausea or vomiting.  Past Medical History  Diagnosis Date  . Renal stones left  . GERD (gastroesophageal reflux disease)   . H/O hiatal hernia   . Pulmonary nodules BENIGN--  MONITORED BY PCP DR READE--  ASYMPTOMATIC     LAST CHEST CT 08-17-2011  . Heart murmur MILD -- ASYMPTOMATIC  . Urge urinary incontinence   . Sinus drainage   . Neuromuscular disorder     rt hand numbness  . Dysuria     has urethral bump  . Ureteral stent retained   . Arthritis   . OSA (obstructive sleep apnea)     not using cpap  . Paranoia   . Obesity   . Chest pain     a. Normal stress test  08/2008 and CTA neg for PE at that time.  Jola Baptist ear dysfunction     a. Prior h/o vertigo.  Marland Kitchen History of infection due to ESBL Escherichia coli 02/23/2014   Past Surgical History  Procedure Laterality Date  . Total knee arthroplasty  10-17-2009  DR ALUISIO    LEFT  . Right total knee arthroplasty  04-25-2009  . Tonsillectomy    . Cardiovascular stress test  03-13-2007  dr Tressia Miners turner    NORMAL LV SIZE SYSTOLIC FUCTION/ NO ISCHEMIA/ EF 85%  . Transthoracic echocardiogram  08-18-2008    NORMAL LVSF/ EF 55-60%/ MILD MITRAL REGURG .  Marland Kitchen Removal breast cyst, benign  1960'S  . Vaginal hysterectomy  1970  . Cataract extraction w/ intraocular lens  implant, bilateral    . Blepharoplasty      BILATERAL  . Cystoscopy with retrograde pyelogram, ureteroscopy and stent placement  05/14/2012    Procedure: [place], URETEROSCOPY AND STENT PLACEMENT;  Surgeon: Alexis Frock, MD;  Location: [facility];  Service: Urology;  Laterality: Left;  1ST STAGE LEFT URETEROSCOPY, LEFT RETROGRADE, DIGITAL URETEROSCOPY,  LEFT STONE REMOVAL WITH ESCAPE BASKET,  STENT PLACEMENt   . Holmium laser application  26/02/4853    Procedure: HOLMIUM LASER APPLICATION;  Surgeon: Alexis Frock, MD;  Location: [facility];  Service: Urology;  Laterality: Left;  . Holmium laser application  11/10/7033    Procedure: HOLMIUM LASER  APPLICATION;  Surgeon: Alexis Frock, MD;  Location: [facility];  Service: Urology;  Laterality: Left;  . Cystoscopy/retrograde/ureteroscopy/stone extraction with basket  06/13/2012    Procedure: CYSTOSCOPY/RETROGRADE/URETEROSCOPY/STONE EXTRACTION WITH BASKET;  Surgeon: Alexis Frock, MD;  Location: [facility];  Service: Urology;  Laterality: Left;  . Cystoscopy w/ ureteral stent placement  06/13/2012    Procedure: CYSTOSCOPY WITH STENT REPLACEMENT;  Surgeon: Alexis Frock, MD;  Location: [facility];   Service: Urology;  Laterality: Left;  . Appendectomy      PT STATES PER XRAY SMALL AMOUNT OF APPENDIX LEFT  . Cystoscopy w/ ureteral stent removal  07/11/2012    Procedure: CYSTOSCOPY WITH STENT REMOVAL;  Surgeon: Alexis Frock, MD;  Location: [facility];  Service: Urology;  Laterality: Left;  . Cystoscopy w/ retrogrades  07/11/2012    Procedure: CYSTOSCOPY WITH RETROGRADE PYELOGRAM;  Surgeon: Alexis Frock, MD;  Location: [facility];  Service: Urology;  Laterality: Left;  left third stage ureteroscopystone manipulation  . Ureteroscopy  07/11/2012    Procedure: URETEROSCOPY;  Surgeon: Alexis Frock, MD;  Location: [facility];  Service: Urology;  Laterality: Left;   Family History  Problem Relation Age of Onset  . Other      Pt unaware due to psych condition   History  Substance Use Topics  . Smoking status: Never Smoker   . Smokeless tobacco: Never Used  . Alcohol Use: No   OB History    No data available     Review of Systems  Cardiovascular: Positive for syncope.      Allergies  Diphenhydramine; Flagyl; Hydrocodone; Meperidine hcl; Penicillins; and Sulfonamide derivatives  Home Medications   Prior to Admission medications   Medication Sig Start Date End Date Taking? Authorizing Provider  acetaminophen (TYLENOL) 325 MG tablet Take 650 mg by mouth every 6 (six) hours as needed for mild pain.    Historical Provider, MD  aspirin EC 325 MG tablet Take 325 mg by mouth daily.    Historical Provider, MD  diltiazem (CARDIZEM) 30 MG tablet Take 3 tablets (90 Mg) in the morning and 2 tablets (60 Mg) in the evening. 04/06/14   Bonnielee Haff, MD  Multiple Vitamins-Minerals (MULTIVITAMIN PO) Take 1 tablet by mouth daily.    Historical Provider, MD  omeprazole (PRILOSEC) 20 MG capsule Take 20 mg by mouth daily.    Historical Provider, MD  pramoxine-mineral oil-zinc (TUCKS) 1-12.5 % rectal ointment Place 1 application rectally every 4  (four) hours as needed for itching or irritation.    Historical Provider, MD  QUEtiapine (SEROQUEL XR) 300 MG 24 hr tablet Take 300 mg by mouth every evening.    Historical Provider, MD  senna-docusate (SENOKOT-S) 8.6-50 MG per tablet Take 1 tablet by mouth 2 (two) times daily.    Historical Provider, MD   BP 156/105 mmHg  Pulse 84  Temp(Src) 98.2 F (36.8 C) (Oral)  Resp 11  Ht 5\' 2"  (1.575 m)  Wt 210 lb (95.255 kg)  BMI 38.40 kg/m2  SpO2 98% Physical Exam  Constitutional: She is oriented to person, place, and time. She appears well-developed and well-nourished. No distress.  HENT:  Head: Normocephalic and atraumatic.  Eyes: Conjunctivae are normal. No scleral icterus.  Neck: Normal range of motion.  Cardiovascular: Normal rate, regular rhythm and normal heart sounds.  Exam reveals no gallop and no friction rub.   No murmur heard. Pulmonary/Chest: Effort normal and breath sounds normal. No respiratory distress.  Abdominal: Soft. Bowel sounds are normal. She exhibits no distension and no mass. There is no tenderness. There is no guarding.  Neurological: She is alert and oriented to person, place, and time.  Skin: Skin is warm and dry. She is not diaphoretic.    ED Course  Procedures (including critical care time) Labs Review Labs Reviewed  CBC WITH DIFFERENTIAL - Abnormal; Notable for the following:    Platelets 145 (*)    All other components within normal limits  COMPREHENSIVE METABOLIC PANEL - Abnormal; Notable for the following:    Glucose, Bld 104 (*)    Albumin 3.4 (*)    GFR calc non Af Amer 60 (*)    GFR calc Af Amer 70 (*)    All other components within normal limits  URINALYSIS, ROUTINE W REFLEX MICROSCOPIC  TROPONIN I  POCT CBG (FASTING - GLUCOSE)-MANUAL ENTRY  I-STAT TROPOININ, ED  CBG MONITORING, ED    Imaging Review No results found.   EKG Interpretation   Date/Time:  Thursday April 15 2014 09:18:56 EST Ventricular Rate:  104 PR Interval:     QRS Duration: 92 QT Interval:  331 QTC Calculation: 435 R Axis:   57 Text Interpretation:  Atrial fibrillation Repol abnrm suggests ischemia,  diffuse leads Confirmed by WARD,  DO, KRISTEN ([id number]) on 04/15/2014  10:44:40 AM      MDM   Final diagnoses:  None    10:03 AM BP 156/105 mmHg  Pulse 84  Temp(Src) 98.2 F (36.8 C) (Oral)  Resp 11  Ht 5\' 2"  (1.575 m)  Wt 210 lb (95.255 kg)  BMI 38.40 kg/m2  SpO2 98% Patient here again with unresponsive episode. Staff state that she was unarousable to voice or pain at the facility for nearly 10 minutes and had a period of confusion. Since previous dictation was for observation. There was a change in her diltiazem.   Spoken today with Dr. Nicole Kindred who is on-call for neurology.I discussed her syncopal episode with him and there is concern for possible seizure as cause of her syncopal episode. He will consult here in the emergency department. Review of chart shows echocardiogram September 2015 with left ventricular ejection fraction of 65%. No stenosis or valve abnormalities noted.  11:30 AM The patient lives without acute abnormality however she does have some thrombocytopenia. I have sent the patient for CT scan of the abdomen that she is having pain in the left upper quadrant.   2:35 PM BP 138/86 mmHg  Pulse 81  Temp(Src) 98.2 F (36.8 C) (Oral)  Resp 12  Ht 5\' 2"  (1.575 m)  Wt 210 lb (95.255 kg)  BMI 38.40 kg/m2  SpO2 96% Patient with interval nodules in the R lobe suspicous for malignancy. Patient will need an admission for further work up for syncope. Dr. Darrick Meigs will admit. Afib with out anticoagulation.      Margarita Mail, PA-C 04/15/14 [place], PA-C 05/09/14 1739

## 2014-04-16 ENCOUNTER — Ambulatory Visit (HOSPITAL_COMMUNITY)
Admission: EM | Admit: 2014-04-16 | Discharge: 2014-04-16 | Disposition: A | Discharge status: Home / Self Care | Payer: Medicare Other | Source: Home / Self Care | Attending: Family Medicine | Admitting: Family Medicine

## 2014-04-16 ENCOUNTER — Inpatient Hospital Stay (HOSPITAL_COMMUNITY): Payer: Medicare Other

## 2014-04-16 ENCOUNTER — Telehealth: Payer: Self-pay | Admitting: Internal Medicine

## 2014-04-16 DIAGNOSIS — R55 Syncope and collapse: Secondary | ICD-10-CM | POA: Insufficient documentation

## 2014-04-16 DIAGNOSIS — I2699 Other pulmonary embolism without acute cor pulmonale: Principal | ICD-10-CM

## 2014-04-16 LAB — CBC
HCT: 40 % (ref 36.0–46.0)
HEMOGLOBIN: 12.9 g/dL (ref 12.0–15.0)
MCH: 30.6 pg (ref 26.0–34.0)
MCHC: 32.3 g/dL (ref 30.0–36.0)
MCV: 95 fL (ref 78.0–100.0)
Platelets: 177 10*3/uL (ref 150–400)
RBC: 4.21 MIL/uL (ref 3.87–5.11)
RDW: 14.1 % (ref 11.5–15.5)
WBC: 4.5 10*3/uL (ref 4.0–10.5)

## 2014-04-16 LAB — COMPREHENSIVE METABOLIC PANEL
ALT: 10 U/L (ref 0–35)
AST: 14 U/L (ref 0–37)
Albumin: 3.3 g/dL — ABNORMAL LOW (ref 3.5–5.2)
Alkaline Phosphatase: 72 U/L (ref 39–117)
Anion gap: 13 (ref 5–15)
BUN: 15 mg/dL (ref 6–23)
CO2: 24 mEq/L (ref 19–32)
CREATININE: 0.8 mg/dL (ref 0.50–1.10)
Calcium: 8.9 mg/dL (ref 8.4–10.5)
Chloride: 106 mEq/L (ref 96–112)
GFR, EST AFRICAN AMERICAN: 79 mL/min — AB (ref >90)
GFR, EST NON AFRICAN AMERICAN: 68 mL/min — AB (ref >90)
GLUCOSE: 85 mg/dL (ref 70–99)
Potassium: 4 mEq/L (ref 3.7–5.3)
Sodium: 143 mEq/L (ref 137–147)
Total Bilirubin: 0.5 mg/dL (ref 0.3–1.2)
Total Protein: 5.7 g/dL — ABNORMAL LOW (ref 6.0–8.3)

## 2014-04-16 LAB — PROTIME-INR
INR: 1.08 (ref 0.00–1.49)
PROTHROMBIN TIME: 14.2 s (ref 11.6–15.2)

## 2014-04-16 LAB — MRSA PCR SCREENING: MRSA BY PCR: NEGATIVE

## 2014-04-16 MED ORDER — FENTANYL CITRATE 0.05 MG/ML IJ SOLN
25.0000 ug | INTRAMUSCULAR | Status: DC | PRN
  Administered 2014-04-16: 25 ug via INTRAVENOUS
  Filled 2014-04-16: qty 2

## 2014-04-16 MED ORDER — HYDROMORPHONE HCL 1 MG/ML IJ SOLN
2.0000 mg | INTRAMUSCULAR | Status: DC | PRN
Start: 2014-04-16 — End: 2014-04-20
  Administered 2014-04-16 – 2014-04-17 (×2): 2 mg via INTRAVENOUS
  Filled 2014-04-16 (×2): qty 2

## 2014-04-16 MED ORDER — WARFARIN VIDEO
Freq: Once | Status: AC
  Administered 2014-04-16: 14:00:00

## 2014-04-16 MED ORDER — SODIUM CHLORIDE 0.9 % IV SOLN
INTRAVENOUS | Status: DC
  Administered 2014-04-16: 14:00:00 via INTRAVENOUS

## 2014-04-16 MED ORDER — ENOXAPARIN SODIUM 100 MG/ML ~~LOC~~ SOLN
100.0000 mg | Freq: Two times a day (BID) | SUBCUTANEOUS | Status: DC
  Administered 2014-04-17 – 2014-04-19 (×5): 100 mg via SUBCUTANEOUS
  Filled 2014-04-16 (×7): qty 1

## 2014-04-16 MED ORDER — COUMADIN BOOK
Freq: Once | Status: AC
  Administered 2014-04-16: 14:00:00
  Filled 2014-04-16: qty 1

## 2014-04-16 MED ORDER — IOHEXOL 350 MG/ML SOLN
70.0000 mL | Freq: Once | INTRAVENOUS | Status: AC | PRN
  Administered 2014-04-16: 70 mL via INTRAVENOUS

## 2014-04-16 MED ORDER — ENOXAPARIN SODIUM 100 MG/ML ~~LOC~~ SOLN
100.0000 mg | SUBCUTANEOUS | Status: AC
  Administered 2014-04-16: 100 mg via SUBCUTANEOUS
  Filled 2014-04-16: qty 1

## 2014-04-16 MED ORDER — WARFARIN SODIUM 4 MG PO TABS
4.0000 mg | ORAL_TABLET | Freq: Once | ORAL | Status: DC
  Filled 2014-04-16 (×2): qty 1

## 2014-04-16 MED ORDER — WARFARIN SODIUM 4 MG PO TABS
4.0000 mg | ORAL_TABLET | Freq: Once | ORAL | Status: AC
  Administered 2014-04-16: 4 mg via ORAL
  Filled 2014-04-16: qty 1

## 2014-04-16 MED ORDER — ASPIRIN EC 81 MG PO TBEC
81.0000 mg | DELAYED_RELEASE_TABLET | Freq: Every day | ORAL | Status: DC
  Administered 2014-04-17 – 2014-04-20 (×4): 81 mg via ORAL
  Filled 2014-04-16 (×4): qty 1

## 2014-04-16 MED ORDER — WARFARIN - PHARMACIST DOSING INPATIENT
Freq: Every day | Status: DC
  Administered 2014-04-16: 18:00:00

## 2014-04-16 MED ORDER — MORPHINE SULFATE 2 MG/ML IJ SOLN
2.0000 mg | INTRAMUSCULAR | Status: DC | PRN
Start: 2014-04-16 — End: 2014-04-16

## 2014-04-16 NOTE — Progress Notes (Signed)
ANTICOAGULATION CONSULT NOTE - Initial Consult  Pharmacy Consult for Lovenox + Warfarin Indication: New acute PE  Allergies  Allergen Reactions  . Diphenhydramine Other (See Comments)    UNKNOWN  . Flagyl [Metronidazole] Other (See Comments)    HALLUCINATIONS  . Hydrocodone Other (See Comments)    HALLUCINATIONS  . Meperidine Hcl Nausea And Vomiting  . Penicillins Hives  . Sulfonamide Derivatives Hives    Patient Measurements: Height: 5\' 2"  (157.5 cm) Weight: 217 lb 9.5 oz (98.7 kg) IBW/kg (Calculated) : 50.1  Vital Signs: Temp: 98.2 F (36.8 C) (11/06 0927) Temp Source: Oral (11/06 0927) BP: 132/81 mmHg (11/06 0927) Pulse Rate: 84 (11/06 0927)  Labs:  Recent Labs  04/15/14 0950 04/15/14 1649 04/16/14 0437 04/16/14 1230  HGB 13.7 12.5 12.9  --   HCT 42.1 38.7 40.0  --   PLT 145* 159 177  --   LABPROT  --   --   --  14.2  INR  --   --   --  1.08  CREATININE 0.88 0.91 0.80  --   TROPONINI <0.30  --   --   --     Estimated Creatinine Clearance: 61.5 mL/min (by C-G formula based on Cr of 0.8).   Medical History: Past Medical History  Diagnosis Date  . Renal stones left  . GERD (gastroesophageal reflux disease)   . H/O hiatal hernia   . Pulmonary nodules BENIGN--  MONITORED BY PCP DR READE--  ASYMPTOMATIC     LAST CHEST CT 08-17-2011  . Heart murmur MILD -- ASYMPTOMATIC  . Urge urinary incontinence   . Sinus drainage   . Neuromuscular disorder     rt hand numbness  . Dysuria     has urethral bump  . Ureteral stent retained   . Arthritis   . OSA (obstructive sleep apnea)     not using cpap  . Paranoia   . Obesity   . Chest pain     a. Normal stress test 08/2008 and CTA neg for PE at that time.  Jola Baptist ear dysfunction     a. Prior h/o vertigo.  Marland Kitchen History of infection due to ESBL Escherichia coli 02/23/2014  . Hypertension     Assessment: 79 YOF who presented to the [facility] on 11/5 after a syncope episode. An abdominal CT on 11/5 showed lung nodules  concerned for malignancy and CTA on 11/6 showed RUL PE. Pharmacy was consulted to start lovenox + warfarin for anticoagulation. The patient will require VTE overlap for a minimum of 5 days per CHEST guidelines and until the INR>2 for 24 hours. Wt: 98.7 kg, SCr 0.8, CrCl~60 ml/min. Given the patient's advanced age, will start at a lower warfarin dose. Baseline INR 1.08  It is noted the patient received a dose of lovenox 40 mg SQ for VTE prophylaxis on 11/5 @ 2200 - okay to start full dose anticoagulation this morning.   Goal of Therapy:  INR 2-3 Anti-Xa level 0.6-1 units/ml 4hrs after LMWH dose given Monitor platelets by anticoagulation protocol: Yes   Plan:  1. Lovenox 100 mg SQ every 12 hours 2. Warfarin 4 mg x 1 dose at 1800 today 3. Reduce ASA to 81 mg (discussed with Dr. Darrick Meigs) 4. Warfarin book/video 5. Daily PT/INR, CBC/SCr every 72 hours 6. Will continue to monitor for any signs/symptoms of bleeding and will follow up with PT/INR in the a.m.   Alycia Rossetti, PharmD, BCPS Clinical Pharmacist Pager: [phone] 04/16/2014 2:07 PM

## 2014-04-16 NOTE — Procedures (Signed)
ELECTROENCEPHALOGRAM REPORT  Patient: Monica Strong       Room #: 3P82 EEG No. ID: [id number] Age: 80 y.o.        Sex: female Referring Physician: Georgiann Mohs Report Date:  04/16/2014        Interpreting Physician: Anthony Sar  History: [name] is an 80 y.o. female with a history of hypertension, dementia, DVT and obstructive sleep apnea admitted following a recurrent episode of loss of consciousness, likely syncopal in etiology.  Indications for study:  Rule out possible seizure disorder  Technique: This is an 18 channel routine scalp EEG performed at the bedside with bipolar and monopolar montages arranged in accordance to the international 10/20 system of electrode placement.   Description: This EEG recording was performed during wakefulness. The crown activity consisted of diffuse low to moderate amplitude 1-2 Hz irregular delta activity with superimposed 7-8 Hz rhythms diffusely. Photic stimulation and hyperventilation were not performed. No epileptiform discharges were recorded.  Interpretation: This EEG is abnormal with mild loss continuous nonspecific slowing of cerebral activity. This pattern of slowing can be seen with degenerative as well as metabolic and toxic encephalopathies. No evidence of an epileptic disorder was seen.   Rush Farmer M.D. [facility] [phone]

## 2014-04-16 NOTE — Significant Event (Signed)
Rapid Response Event Note  Overview: Time Called: 0901 Arrival Time: 0905 Event Type: Cardiac  Initial Focused Assessment: Patient complains of pain left lower chest/lower ribs.  CP 6/10.  She states that she has this pain regularly but it is lasting much longer this am.  She states that the pain normally disappears on its own within a few minutes. She states that it is worse with inspiration. Patient is currently in the EEG procedural area.   Per CCMD patient rhythm has been AF 80-90 this morning.  The only alter for rapid HR was while patient was active earlier today. Lung sounds clear, heart tones irregular. Patient with periods of apnea when she is quiet.   Interventions: 12 lead EKG done BP 132/81  AF 80s  RR 22 O2 sat 93 MD notified of patient complaint of unrelieved CP Transported back to room (5Z96)  Rn at bedside  Daughter at bedside Per EEG staff, they will perform EEG in patient's room. RN to call if assistance needed  Event Summary: Name of Physician Notified: Dr Darrick Meigs at [phone]    at    Outcome: Stayed in room and stabalized  Event End Time: 7915  Raliegh Ip

## 2014-04-16 NOTE — Plan of Care (Signed)
Problem: Phase I Progression Outcomes Goal: Pain controlled with appropriate interventions Outcome: Progressing     

## 2014-04-16 NOTE — Telephone Encounter (Signed)
[name]  This patient Monica Strong is inpatient now. Hospitalist called me: wanted me to fu after PE and has mediastinal node. First avail in 3-4 weeks is fine. Please give appt  Thanks  Dr. Brand Males, M.D., [facility].C.P Pulmonary and Critical Care Medicine Staff Physician [place] [facility] Pager: [phone], If no answer or between  15:00h - 7:00h: call [phone]  04/16/2014 3:56 PM

## 2014-04-16 NOTE — Progress Notes (Signed)
Subjective: No complaints at this time.  No further syncopal episodes while in hospital.   Objective: Current vital signs: BP 132/81 mmHg  Pulse 84  Temp(Src) 98.2 F (36.8 C) (Oral)  Resp 22  Ht 5\' 2"  (1.575 m)  Wt 98.7 kg (217 lb 9.5 oz)  BMI 39.79 kg/m2  SpO2 93% Vital signs in last 24 hours: Temp:  [98.2 F (36.8 C)-98.7 F (37.1 C)] 98.2 F (36.8 C) (11/06 0927) Pulse Rate:  [79-100] 84 (11/06 0927) Resp:  [12-22] 22 (11/06 0927) BP: (106-139)/(65-93) 132/81 mmHg (11/06 0927) SpO2:  [93 %-99 %] 93 % (11/06 0927) Weight:  [98.7 kg (217 lb 9.5 oz)] 98.7 kg (217 lb 9.5 oz) (11/05 1737)  Intake/Output from previous day:   Intake/Output this shift:   Nutritional status:    Neurologic Exam: Mental Status: Alert, oriented to person place and time, no acute distress. Speech fluent without evidence of aphasia. Able to follow commands without difficulty. Cranial Nerves: II-Visual fields were normal. III/IV/VI-Pupils were equal and reacted. Extraocular movements were full and conjugate.   V/VII-no facial numbness and no facial weakness. VIII-normal. X-normal speech and symmetrical palatal movement. Motor: 5/5 bilaterally with normal tone and bulk Sensory: Normal throughout. Deep Tendon Reflexes: absent in lower extremities. Plantars: Flexor bilaterally Cerebellar: Normal finger-to-nose testing  Lab Results: Basic Metabolic Panel:  Recent Labs Lab 04/15/14 0950 04/15/14 1649 04/16/14 0437  NA 146  --  143  K 4.1  --  4.0  CL 108  --  106  CO2 27  --  24  GLUCOSE 104*  --  85  BUN 17  --  15  CREATININE 0.88 0.91 0.80  CALCIUM 9.4  --  8.9    Liver Function Tests:  Recent Labs Lab 04/15/14 0950 04/16/14 0437  AST 15 14  ALT 10 10  ALKPHOS 76 72  BILITOT 0.4 0.5  PROT 6.2 5.7*  ALBUMIN 3.4* 3.3*   No results for input(s): LIPASE, AMYLASE in the last 168 hours. No results for input(s): AMMONIA in the last 168 hours.  CBC:  Recent Labs Lab  04/15/14 0950 04/15/14 1649 04/16/14 0437  WBC 4.3 4.1 4.5  NEUTROABS 2.5  --   --   HGB 13.7 12.5 12.9  HCT 42.1 38.7 40.0  MCV 95.7 95.6 95.0  PLT 145* 159 177    Cardiac Enzymes:  Recent Labs Lab 04/15/14 0950  TROPONINI <0.30    Lipid Panel: No results for input(s): CHOL, TRIG, HDL, CHOLHDL, VLDL, LDLCALC in the last 168 hours.  CBG:  Recent Labs Lab 04/15/14 1113  GLUCAP 84    Microbiology: Results for orders placed or performed during the hospital encounter of 04/15/14  MRSA PCR Screening     Status: None   Collection Time: 04/16/14  5:50 AM  Result Value Ref Range Status   MRSA by PCR NEGATIVE NEGATIVE Final    Comment:        The GeneXpert MRSA Assay (FDA approved for NASAL specimens only), is one component of a comprehensive MRSA colonization surveillance program. It is not intended to diagnose MRSA infection nor to guide or monitor treatment for MRSA infections.     Coagulation Studies: No results for input(s): LABPROT, INR in the last 72 hours.  Imaging: Dg Chest 2 View  04/15/2014   CLINICAL DATA:  Loss of consciousness earlier today. One day history of left-sided chest pain. Non smoker.  EXAM: CHEST  2 VIEW  COMPARISON:  Portable chest x-ray 02/16/2014. CT  chest 08/22/2012, 08/22/2011, 08/31/2010, 03/10/2010.  FINDINGS: Suboptimal inspiration due to body habitus accounts for crowded bronchovascular markings, especially in the bases, and accentuates the cardiac silhouette. Taking this into account, cardiac silhouette moderately enlarged but stable. Thoracic aorta tortuous in a atherosclerotic, unchanged. Hilar and mediastinal contours otherwise unremarkable. Vague oval-shaped nodule in the right middle lobe, unchanged from the prior CTs. Linear scar or atelectasis in the lingula. Lungs otherwise clear. Bronchovascular markings normal. Pulmonary vascularity normal. No visible pleural effusions. No pneumothorax. Generalized osseous demineralization and  spondylosis throughout the thoracic spine.  IMPRESSION: 1. Linear atelectasis or scar in the lingula. No acute cardiopulmonary disease otherwise. 2. Stable oval-shaped nodule in the right middle lobe dating back to 2011, indicating benignity. 3. Stable moderate cardiomegaly without pulmonary edema.   Electronically Signed   By: Evangeline Dakin M.D.   On: 04/15/2014 10:43   Ct Head Wo Contrast  04/16/2014   CLINICAL DATA:  Syncopal episode yesterday.  EXAM: CT HEAD WITHOUT CONTRAST  TECHNIQUE: Contiguous axial images were obtained from the base of the skull through the vertex without intravenous contrast.  COMPARISON:  02/21/2014  FINDINGS: There is no evidence of acute cortical infarct, intracranial hemorrhage, mass, midline shift, or extra-axial fluid collection. Ventricles and sulci are within normal limits. No significant white matter disease is identified.  Prior bilateral cataract extraction is noted. Small amount of cerumen is noted in the right greater than left external auditory canal is. Hyperostosis frontalis is noted. Visualized paranasal sinuses and mastoid air cells are clear.  IMPRESSION: Unremarkable CT appearance of the brain for age.   Electronically Signed   By: Logan Bores   On: 04/16/2014 11:28   Ct Angio Chest Pe W/cm &/or Wo Cm  04/16/2014   CLINICAL DATA:  Acute onset of left anterior chest pain today.  EXAM: CT ANGIOGRAPHY CHEST WITH CONTRAST  TECHNIQUE: Multidetector CT imaging of the chest was performed using the standard protocol during bolus administration of intravenous contrast. Multiplanar CT image reconstructions and MIPs were obtained to evaluate the vascular anatomy.  CONTRAST:  14mL OMNIPAQUE IOHEXOL 350 MG/ML SOLN  COMPARISON:  08/22/2012  FINDINGS: There are filling defects within the right upper lobe pulmonary arteries, anterior and posterior segments compatible with moderate-sized pulmonary emboli. No other filling defects are noted.  Heart is enlarged. There is a small  pericardial effusion, including fluid within the pericardial recess anterior to the aortic arch. Adjacent to this pericardial recesses an enlarged prevascular lymph node with a short axis diameter of 2.3 cm compared with 8 mm previously.  Coronary artery calcifications are noted. There are trace bilateral pleural effusions. Minimal dependent atelectasis in both lower lungs posteriorly.  Right middle lobe nodule on image 40 measures 10 mm. This previously measured 10 mm. Small right and 2 scratch has small right upper lobe nodule noted peripherally on image 31 is stable as well.  Chest wall soft tissues are unremarkable. Moderate-sized hiatal hernia containing fat. Multiple hypodensities scattered throughout the liver most likely reflects cysts. High density material is noted within the gallbladder, likely vicarious excretion of contrast related to prior abdominal CT.  Review of the MIP images confirms the above findings.  IMPRESSION: Moderate-sized pulmonary emboli in the right upper lobe. No evidence of right heart strain.  Small pericardial effusion, trace pleural effusions.  Cardiomegaly.  Enlarging prevascular adenopathy since prior chest CT. Cannot exclude metastatic adenopathy or lymphoproliferative disorder.  Right upper lobe and right middle lobe pulmonary nodules are stable since 2014.  These  results were called by telephone at the time of interpretation on 04/16/2014 at 11:27 am to patient's nurse Caryl Pina, who verbally acknowledged these results.   Electronically Signed   By: Rolm Baptise M.D.   On: 04/16/2014 11:29   Ct Abdomen Pelvis W Contrast  04/15/2014   CLINICAL DATA:  Intermittent left upper quadrant abdominal pain for several days.  EXAM: CT ABDOMEN AND PELVIS WITH CONTRAST  TECHNIQUE: Multidetector CT imaging of the abdomen and pelvis was performed using the standard protocol following bolus administration of intravenous contrast.  CONTRAST:  154mL OMNIPAQUE IOHEXOL 300 MG/ML  SOLN  COMPARISON:   Abdomen radiographs dated 03/09/2013. Abdomen and pelvis CT dated 04/11/2012.  FINDINGS: Multiple liver cysts are again demonstrated. Bilateral parapelvic renal cysts. No hydronephrosis. Unremarkable spleen, pancreas, gallbladder, adrenal glands, ureters and urinary bladder. Surgically absent uterus. No adnexal masses enlarged lymph nodes.  Interval small amount of linear atelectasis or scarring at the right lung base. Interval oval nodule in the right middle lobe measuring 1.3 cm on image 8. There is also a 4 mm semi-solid nodule in the right upper lobe on image number 2 and a 4 mm nodule in the anterior medial right middle lobe on image 8. Decreased amount of pericardial with a small amount of fluid remaining, measuring 7 mm in maximum thickness.  Stool throughout the majority of the colon. No gastric or small bowel abnormalities. Moderate sized hiatal hernia containing fat without herniated stomach. Bilateral pelvic phleboliths.  IMPRESSION: 1. Interval nodules in the right middle lobe and right upper lobe suspicious for malignancy or a metastatic process, as described above. These could be further characterized with PET-CT if clinically indicated. 2. Minimal pericardial effusion, improved.   Electronically Signed   By: Enrique Sack M.D.   On: 04/15/2014 13:31    Medications:  Scheduled: . aspirin EC  325 mg Oral Daily  . diltiazem  60 mg Oral QPM  . diltiazem  90 mg Oral q morning - 10a  . enoxaparin (LOVENOX) injection  100 mg Subcutaneous STAT  . pantoprazole  40 mg Oral Daily  . QUEtiapine  400 mg Oral QPM  . sodium chloride  3 mL Intravenous Q12H    Assessment/Plan: 80 year old lady with atrial fibrillation presenting with recurrent loss of consciousness, most likely syncopal in etiology, particularly with bradycardia and no witness seizure activity. Confusion on regaining consciousness may be a manifestation of dementia. While in hospital found to have a PE and now will be started on coumadin.  CT head showed no acute abnormalities. EEG pending.   Recommend: 1) EEG. If normal no further neurology recommendations at this time.        Etta Quill PA-C [facility] [phone]  04/16/2014, 1:33 PM

## 2014-04-16 NOTE — Telephone Encounter (Signed)
You do not have any openings till January. Is it ok for pt to see TP?

## 2014-04-16 NOTE — Progress Notes (Signed)
Patient's PIV #20g in rt forearm was infiltrated on arrival to CT, first noted when attempting to flush with saline.  A new #20g nexiva catheter was placed in her right ac prior to contrast administration, and was used for CT Angio. The Right A/C IV has been discontinued, no bleeding or bruising noted.

## 2014-04-16 NOTE — Discharge Instructions (Signed)
Information on my medicine - Coumadin   (Warfarin)  This medication education was reviewed with me or my healthcare representative as part of my discharge preparation.  The pharmacist that spoke with me during my hospital stay was:  Lawson Radar, [facility]  Why was Coumadin prescribed for you? Coumadin was prescribed for you because you have a blood clot or a medical condition that can cause an increased risk of forming blood clots. Blood clots can cause serious health problems by blocking the flow of blood to the heart, lung, or brain. Coumadin can prevent harmful blood clots from forming. As a reminder your indication for Coumadin is:   Pulmonary Embolism Treatment  What test will check on my response to Coumadin? While on Coumadin (warfarin) you will need to have an INR test regularly to ensure that your dose is keeping you in the desired range. The INR (international normalized ratio) number is calculated from the result of the laboratory test called prothrombin time (PT).  If an INR APPOINTMENT HAS NOT ALREADY BEEN MADE FOR YOU please schedule an appointment to have this lab work done by your health care provider within 7 days. Your INR goal is usually a number between:  2 to 3 or your provider may give you a more narrow range like 2-2.5.  Ask your health care provider during an office visit what your goal INR is.  What  do you need to  know  About  COUMADIN? Take Coumadin (warfarin) exactly as prescribed by your healthcare provider about the same time each day.  DO NOT stop taking without talking to the doctor who prescribed the medication.  Stopping without other blood clot prevention medication to take the place of Coumadin may increase your risk of developing a new clot or stroke.  Get refills before you run out.  What do you do if you miss a dose? If you miss a dose, take it as soon as you remember on the same day then continue your regularly scheduled regimen the next day.  Do not  take two doses of Coumadin at the same time.  Important Safety Information A possible side effect of Coumadin (Warfarin) is an increased risk of bleeding. You should call your healthcare provider right away if you experience any of the following: ? Bleeding from an injury or your nose that does not stop. ? Unusual colored urine (red or dark brown) or unusual colored stools (red or black). ? Unusual bruising for unknown reasons. ? A serious fall or if you hit your head (even if there is no bleeding).  Some foods or medicines interact with Coumadin (warfarin) and might alter your response to warfarin. To help avoid this: ? Eat a balanced diet, maintaining a consistent amount of Vitamin K. ? Notify your provider about major diet changes you plan to make. ? Avoid alcohol or limit your intake to 1 drink for women and 2 drinks for men per day. (1 drink is 5 oz. wine, 12 oz. beer, or 1.5 oz. liquor.)  Make sure that ANY health care provider who prescribes medication for you knows that you are taking Coumadin (warfarin).  Also make sure the healthcare provider who is monitoring your Coumadin knows when you have started a new medication including herbals and non-prescription products.  Coumadin (Warfarin)  Major Drug Interactions  Increased Warfarin Effect Decreased Warfarin Effect  Alcohol (large quantities) Antibiotics (esp. Septra/Bactrim, Flagyl, Cipro) Amiodarone (Cordarone) Aspirin (ASA) Cimetidine (Tagamet) Megestrol (Megace) NSAIDs (ibuprofen, naproxen, etc.) Piroxicam (  Feldene) °Propafenone (Rythmol SR) °Propranolol (Inderal) °Isoniazid (INH) °Posaconazole (Noxafil) Barbiturates (Phenobarbital) °Carbamazepine (Tegretol) °Chlordiazepoxide (Librium) °Cholestyramine (Questran) °Griseofulvin °Oral Contraceptives °Rifampin °Sucralfate (Carafate) °Vitamin K  ° °Coumadin® (Warfarin) Major Herbal Interactions  °Increased Warfarin Effect Decreased Warfarin Effect  °Garlic °Ginseng °Ginkgo biloba  Coenzyme Q10 °Green tea °St. John’s wort   ° °Coumadin® (Warfarin) FOOD Interactions  °Eat a consistent number of servings per week of foods HIGH in Vitamin K °(1 serving = ½ cup)  °Collards (cooked, or boiled & drained) °Kale (cooked, or boiled & drained) °Mustard greens (cooked, or boiled & drained) °Parsley *serving size only = ¼ cup °Spinach (cooked, or boiled & drained) °Swiss chard (cooked, or boiled & drained) °Turnip greens (cooked, or boiled & drained)  °Eat a consistent number of servings per week of foods MEDIUM-HIGH in Vitamin K °(1 serving = 1 cup)  °Asparagus (cooked, or boiled & drained) °Broccoli (cooked, boiled & drained, or raw & chopped) °Brussel sprouts (cooked, or boiled & drained) *serving size only = ½ cup °Lettuce, raw (green leaf, endive, romaine) °Spinach, raw °Turnip greens, raw & chopped  ° °These websites have more information on Coumadin (warfarin):  www.coumadin.com; °www.ahrq.gov/consumer/coumadin.htm; ° ° ° °

## 2014-04-16 NOTE — Progress Notes (Signed)
Bedside EEG completed, results pending. 

## 2014-04-16 NOTE — Progress Notes (Signed)
Utilization review completed.  

## 2014-04-16 NOTE — Progress Notes (Signed)
[facility] PROGRESS NOTE  AANIYA STERBA ZOX:[id number] DOB: 1931/09/25 DOA: 04/15/2014 PCP: Vena Austria, MD  Assessment/Plan: 1. Pulmonary embolism- CT angiogram shows moderate size pulmonary emboli in the right upper lobe.we'll start the patient on heparin per pharmacy consultation and Coumadin. 2. Syncope versus seizure- EEG is pending, likely patient had syncope from pulmonary embolism. Neurology has seen the patient.CT head is negative for any acute abnormality. 3. Lung mass.- CT chest shows patient has right middle and upper lobe nodules,enlarging prevascular adenopathy . I discussed in detail with family including both daughters, they'll follow up with pulmonary as outpatient for further workup.Called and discussed with Dr. Chase Caller, who will see the patient in 4 weeks in the office 4. Atrial fibrillation- Heart rate is controlled continue Cardizem. Patient is not on anticoagulation. Continue aspirin 81 mg by mouth daily.   Code Status: full code Family Communication: *spoke with daughter at bedside and also another daughter on phone Disposition Plan: home when stable   Consultants:  none  Procedures:  none  Antibiotics: None  HPI/Subjective: 80 year old female was brought to the hospital by EMS after patient was found to be unresponsive at the assisted living facility. Patient is a poor historian, but remembers she was about to get the phone and after that everything went blank. There was no seizure-like activity noted by the nursing staff, she was found to be slumped in the chair. Patient was unresponsive for about 10 minutes, though there was no loss of bowel or bladder control, no biting of tongue. In the ED patient was seen by neurology who recommended patient to be admitted for further evaluation including EEG.she also complained of left-sided abdominal pain,CT abdomen pelvis was done which showed interval nodules in the right middle lobe and right upper  lobe suspicious for malignancy. She denies chest pain, no shortness of breath no nausea vomiting or diarrhea. She denies palpitations no fever no dysuria urgency or frequency of urination.  CT angiogram done this morning showed pulmonary embolism  Objective: Filed Vitals:   04/16/14 0927  BP: 132/81  Pulse: 84  Temp: 98.2 F (36.8 C)  Resp: 22   No intake or output data in the 24 hours ending 04/16/14 1529 Filed Weights   04/15/14 0925 04/15/14 1737  Weight: 95.255 kg (210 lb) 98.7 kg (217 lb 9.5 oz)    Exam:  Physical Exam: Head: Normocephalic, atraumatic.  Eyes: No signs of jaundice, EOMI. Lungs: Normal respiratory effort. B/L Clear to auscultation, no crackles or wheezes.  Heart: Regular RR. S1 and S2 normal  Abdomen: BS normoactive. Soft, Nondistended, non-tender.  Extremities: No pretibial edema, no erythema   Data Reviewed: Basic Metabolic Panel:  Recent Labs Lab 04/15/14 0950 04/15/14 1649 04/16/14 0437  NA 146  --  143  K 4.1  --  4.0  CL 108  --  106  CO2 27  --  24  GLUCOSE 104*  --  85  BUN 17  --  15  CREATININE 0.88 0.91 0.80  CALCIUM 9.4  --  8.9   Liver Function Tests:  Recent Labs Lab 04/15/14 0950 04/16/14 0437  AST 15 14  ALT 10 10  ALKPHOS 76 72  BILITOT 0.4 0.5  PROT 6.2 5.7*  ALBUMIN 3.4* 3.3*   No results for input(s): LIPASE, AMYLASE in the last 168 hours. No results for input(s): AMMONIA in the last 168 hours. CBC:  Recent Labs Lab 04/15/14 0950 04/15/14 1649 04/16/14 0437  WBC 4.3 4.1 4.5  NEUTROABS 2.5  --   --  HGB 13.7 12.5 12.9  HCT 42.1 38.7 40.0  MCV 95.7 95.6 95.0  PLT 145* 159 177   Cardiac Enzymes:  Recent Labs Lab 04/15/14 0950  TROPONINI <0.30   BNP (last 3 results) No results for input(s): PROBNP in the last 8760 hours. CBG:  Recent Labs Lab 04/15/14 1113  GLUCAP 84    Recent Results (from the past 240 hour(s))  MRSA PCR Screening     Status: None   Collection Time: 04/16/14  5:50 AM   Result Value Ref Range Status   MRSA by PCR NEGATIVE NEGATIVE Final    Comment:        The GeneXpert MRSA Assay (FDA approved for NASAL specimens only), is one component of a comprehensive MRSA colonization surveillance program. It is not intended to diagnose MRSA infection nor to guide or monitor treatment for MRSA infections.      Studies: Dg Chest 2 View  04/15/2014   CLINICAL DATA:  Loss of consciousness earlier today. One day history of left-sided chest pain. Non smoker.  EXAM: CHEST  2 VIEW  COMPARISON:  Portable chest x-ray 02/16/2014. CT chest 08/22/2012, 08/22/2011, 08/31/2010, 03/10/2010.  FINDINGS: Suboptimal inspiration due to body habitus accounts for crowded bronchovascular markings, especially in the bases, and accentuates the cardiac silhouette. Taking this into account, cardiac silhouette moderately enlarged but stable. Thoracic aorta tortuous in a atherosclerotic, unchanged. Hilar and mediastinal contours otherwise unremarkable. Vague oval-shaped nodule in the right middle lobe, unchanged from the prior CTs. Linear scar or atelectasis in the lingula. Lungs otherwise clear. Bronchovascular markings normal. Pulmonary vascularity normal. No visible pleural effusions. No pneumothorax. Generalized osseous demineralization and spondylosis throughout the thoracic spine.  IMPRESSION: 1. Linear atelectasis or scar in the lingula. No acute cardiopulmonary disease otherwise. 2. Stable oval-shaped nodule in the right middle lobe dating back to 2011, indicating benignity. 3. Stable moderate cardiomegaly without pulmonary edema.   Electronically Signed   By: Evangeline Dakin M.D.   On: 04/15/2014 10:43   Ct Head Wo Contrast  04/16/2014   CLINICAL DATA:  Syncopal episode yesterday.  EXAM: CT HEAD WITHOUT CONTRAST  TECHNIQUE: Contiguous axial images were obtained from the base of the skull through the vertex without intravenous contrast.  COMPARISON:  02/21/2014  FINDINGS: There is no  evidence of acute cortical infarct, intracranial hemorrhage, mass, midline shift, or extra-axial fluid collection. Ventricles and sulci are within normal limits. No significant white matter disease is identified.  Prior bilateral cataract extraction is noted. Small amount of cerumen is noted in the right greater than left external auditory canal is. Hyperostosis frontalis is noted. Visualized paranasal sinuses and mastoid air cells are clear.  IMPRESSION: Unremarkable CT appearance of the brain for age.   Electronically Signed   By: Logan Bores   On: 04/16/2014 11:28   Ct Angio Chest Pe W/cm &/or Wo Cm  04/16/2014   CLINICAL DATA:  Acute onset of left anterior chest pain today.  EXAM: CT ANGIOGRAPHY CHEST WITH CONTRAST  TECHNIQUE: Multidetector CT imaging of the chest was performed using the standard protocol during bolus administration of intravenous contrast. Multiplanar CT image reconstructions and MIPs were obtained to evaluate the vascular anatomy.  CONTRAST:  51mL OMNIPAQUE IOHEXOL 350 MG/ML SOLN  COMPARISON:  08/22/2012  FINDINGS: There are filling defects within the right upper lobe pulmonary arteries, anterior and posterior segments compatible with moderate-sized pulmonary emboli. No other filling defects are noted.  Heart is enlarged. There is a small pericardial effusion, including fluid within  the pericardial recess anterior to the aortic arch. Adjacent to this pericardial recesses an enlarged prevascular lymph node with a short axis diameter of 2.3 cm compared with 8 mm previously.  Coronary artery calcifications are noted. There are trace bilateral pleural effusions. Minimal dependent atelectasis in both lower lungs posteriorly.  Right middle lobe nodule on image 40 measures 10 mm. This previously measured 10 mm. Small right and 2 scratch has small right upper lobe nodule noted peripherally on image 31 is stable as well.  Chest wall soft tissues are unremarkable. Moderate-sized hiatal hernia  containing fat. Multiple hypodensities scattered throughout the liver most likely reflects cysts. High density material is noted within the gallbladder, likely vicarious excretion of contrast related to prior abdominal CT.  Review of the MIP images confirms the above findings.  IMPRESSION: Moderate-sized pulmonary emboli in the right upper lobe. No evidence of right heart strain.  Small pericardial effusion, trace pleural effusions.  Cardiomegaly.  Enlarging prevascular adenopathy since prior chest CT. Cannot exclude metastatic adenopathy or lymphoproliferative disorder.  Right upper lobe and right middle lobe pulmonary nodules are stable since 2014.  These results were called by telephone at the time of interpretation on 04/16/2014 at 11:27 am to patient's nurse Caryl Pina, who verbally acknowledged these results.   Electronically Signed   By: Rolm Baptise M.D.   On: 04/16/2014 11:29   Ct Abdomen Pelvis W Contrast  04/15/2014   CLINICAL DATA:  Intermittent left upper quadrant abdominal pain for several days.  EXAM: CT ABDOMEN AND PELVIS WITH CONTRAST  TECHNIQUE: Multidetector CT imaging of the abdomen and pelvis was performed using the standard protocol following bolus administration of intravenous contrast.  CONTRAST:  133mL OMNIPAQUE IOHEXOL 300 MG/ML  SOLN  COMPARISON:  Abdomen radiographs dated 03/09/2013. Abdomen and pelvis CT dated 04/11/2012.  FINDINGS: Multiple liver cysts are again demonstrated. Bilateral parapelvic renal cysts. No hydronephrosis. Unremarkable spleen, pancreas, gallbladder, adrenal glands, ureters and urinary bladder. Surgically absent uterus. No adnexal masses enlarged lymph nodes.  Interval small amount of linear atelectasis or scarring at the right lung base. Interval oval nodule in the right middle lobe measuring 1.3 cm on image 8. There is also a 4 mm semi-solid nodule in the right upper lobe on image number 2 and a 4 mm nodule in the anterior medial right middle lobe on image 8.  Decreased amount of pericardial with a small amount of fluid remaining, measuring 7 mm in maximum thickness.  Stool throughout the majority of the colon. No gastric or small bowel abnormalities. Moderate sized hiatal hernia containing fat without herniated stomach. Bilateral pelvic phleboliths.  IMPRESSION: 1. Interval nodules in the right middle lobe and right upper lobe suspicious for malignancy or a metastatic process, as described above. These could be further characterized with PET-CT if clinically indicated. 2. Minimal pericardial effusion, improved.   Electronically Signed   By: Enrique Sack M.D.   On: 04/15/2014 13:31    Scheduled Meds: . [START ON 04/17/2014] aspirin EC  81 mg Oral Daily  . diltiazem  60 mg Oral QPM  . diltiazem  90 mg Oral q morning - 10a  . [START ON 04/17/2014] enoxaparin (LOVENOX) injection  100 mg Subcutaneous Q12H  . pantoprazole  40 mg Oral Daily  . QUEtiapine  400 mg Oral QPM  . sodium chloride  3 mL Intravenous Q12H  . warfarin  4 mg Oral ONCE-1800  . Warfarin - Pharmacist Dosing Inpatient   Does not apply q1800   Continuous Infusions: .  sodium chloride 50 mL/hr at 04/16/14 1355    Active Problems:   Atrial fibrillation   Syncope   Thrombocytopenia   LOC (loss of consciousness)   Faintness    Time spent: 25 min    [facility] Pager [phone] If 7PM-7AM, please contact night-coverage at [email or web address], password [facility] 04/16/2014, 3:29 PM  LOS: 1 day

## 2014-04-16 NOTE — Progress Notes (Signed)
Radiologist called to notify RN that patient had a right upper lobe pulmonary embolism.  Dr. Darrick Meigs notified.

## 2014-04-17 LAB — PROTIME-INR
INR: 1.12 (ref 0.00–1.49)
Prothrombin Time: 14.5 seconds (ref 11.6–15.2)

## 2014-04-17 MED ORDER — DILTIAZEM HCL 60 MG PO TABS
60.0000 mg | ORAL_TABLET | Freq: Four times a day (QID) | ORAL | Status: DC
  Administered 2014-04-17 – 2014-04-20 (×13): 60 mg via ORAL
  Filled 2014-04-17 (×16): qty 1

## 2014-04-17 MED ORDER — WARFARIN SODIUM 5 MG PO TABS
5.0000 mg | ORAL_TABLET | Freq: Once | ORAL | Status: AC
  Administered 2014-04-17: 5 mg via ORAL
  Filled 2014-04-17: qty 1

## 2014-04-17 NOTE — Plan of Care (Signed)
Problem: Consults Goal: Skin Care Protocol Initiated - if Braden Score 18 or less If consults are not indicated, leave blank or document N/A  Outcome: Completed/Met Date Met:  04/17/14 Goal: Nutrition Consult-if indicated Outcome: Not Applicable Date Met:  23/34/35 Goal: Diabetes Guidelines if Diabetic/Glucose > 140 If diabetic or lab glucose is > 140 mg/dl - Initiate Diabetes/Hyperglycemia Guidelines & Document Interventions  Outcome: Not Applicable Date Met:  68/61/68

## 2014-04-17 NOTE — Progress Notes (Signed)
ANTICOAGULATION CONSULT NOTE - Follow Up Consult  Pharmacy Consult for lovenox and coumadin Indication: pulmonary embolus  Allergies  Allergen Reactions  . Diphenhydramine Other (See Comments)    UNKNOWN  . Flagyl [Metronidazole] Other (See Comments)    HALLUCINATIONS  . Hydrocodone Other (See Comments)    HALLUCINATIONS  . Meperidine Hcl Nausea And Vomiting  . Penicillins Hives  . Sulfonamide Derivatives Hives    Patient Measurements: Height: 5\' 2"  (157.5 cm) Weight: 214 lb 1.6 oz (97.115 kg) IBW/kg (Calculated) : 50.1 Heparin Dosing Weight:   Vital Signs: Temp: 98.9 F (37.2 C) (11/07 1020) Temp Source: Oral (11/07 1020) BP: 131/75 mmHg (11/07 1020) Pulse Rate: 79 (11/07 1020)  Labs:  Recent Labs  04/15/14 0950 04/15/14 1649 04/16/14 0437 04/16/14 1230 04/17/14 0459  HGB 13.7 12.5 12.9  --   --   HCT 42.1 38.7 40.0  --   --   PLT 145* 159 177  --   --   LABPROT  --   --   --  14.2 14.5  INR  --   --   --  1.08 1.12  CREATININE 0.88 0.91 0.80  --   --   TROPONINI <0.30  --   --   --   --     Estimated Creatinine Clearance: 61 mL/min (by C-G formula based on Cr of 0.8).   Medications:  Scheduled:  . aspirin EC  81 mg Oral Daily  . diltiazem  60 mg Oral QPM  . diltiazem  90 mg Oral q morning - 10a  . enoxaparin (LOVENOX) injection  100 mg Subcutaneous Q12H  . pantoprazole  40 mg Oral Daily  . QUEtiapine  400 mg Oral QPM  . sodium chloride  3 mL Intravenous Q12H  . warfarin  4 mg Oral ONCE-1800  . Warfarin - Pharmacist Dosing Inpatient   Does not apply q1800   Infusions:  . sodium chloride 50 mL/hr at 04/16/14 1355    Assessment: 80 yo female with RUL PE is currently on subthearpeutic coumadin bridging with treatment dose of lovenox.  INR today is 1.12.  Day 2 of 5 of VTE overlap. Goal of Therapy:  Anti-Xa level 0.6-1 units/ml 4hrs after LMWH dose given; INR 2-3 Monitor platelets by anticoagulation protocol: Yes   Plan:  - Cont lovenox 100 mg  bid (need to adjust time daily to avoid middle of night injection) - Warfarin 5 mg po x1  Tray Klayman, Tsz-Yin 04/17/2014,11:38 AM

## 2014-04-17 NOTE — Clinical Social Work Note (Signed)
CSW continues to follow for d/c planning needs. CSW spoke with patient's RN, Caryl Pina, who reports patient not ready for d/c on this date. CSW to follow tomorrow.  [place], [place] Weekend Clinical Social Worker [phone]

## 2014-04-17 NOTE — Progress Notes (Signed)
[facility] PROGRESS NOTE  Monica Strong ESP:[id number] DOB: 1932/03/13 DOA: 04/15/2014 PCP: Vena Austria, MD  Assessment/Plan: 1. Pulmonary embolism- CT angiogram shows moderate size pulmonary emboli in the right upper lobe.we'll start the patient on heparin per pharmacy consultation and Coumadin. 2. Syncope versus seizure- EEG shows mild loss continuous nonspecific slowing of the cerebral activity consistent with metabolic or toxic encephalopathy., likely patient had syncope from pulmonary embolism. Neurology has seen the patient.CT head is negative for any acute abnormality. 3. Lung mass.- CT chest shows patient has right middle and upper lobe nodules,enlarging prevascular adenopathy . I discussed in detail with family including both daughters, they'll follow up with pulmonary as outpatient for further workup.Called and discussed with Dr. Chase Caller, who will see the patient in 4 weeks in the office 4. Atrial fibrillation- Heart rate is controlled continue Cardizem. Patient is not on anticoagulation. Continue aspirin 81 mg by mouth daily. 5. Somnolence/Paranoia- Patient was taking Seroquel 75 mg po daily in September, and the dose of Seroquel was increased to 400 mg po daily. Patient is now excessively somnolent. Will need medication adjustment.Will get Psych consult.   Code Status: full code Family Communication: *spoke with daughter at bedside and also another daughter on phone Disposition Plan: home when stable   Consultants:  none  Procedures:  none  Antibiotics: None  HPI/Subjective: 80 year old female was brought to the hospital by EMS after patient was found to be unresponsive at the assisted living facility. Patient is a poor historian, but remembers she was about to get the phone and after that everything went blank. There was no seizure-like activity noted by the nursing staff, she was found to be slumped in the chair. Patient was unresponsive for about 10  minutes, though there was no loss of bowel or bladder control, no biting of tongue. In the ED patient was seen by neurology who recommended patient to be admitted for further evaluation including EEG.she also complained of left-sided abdominal pain,CT abdomen pelvis was done which showed interval nodules in the right middle lobe and right upper lobe suspicious for malignancy. She denies chest pain, no shortness of breath no nausea vomiting or diarrhea. She denies palpitations no fever no dysuria urgency or frequency of urination.  Patient is very drowsy this morning.  Objective: Filed Vitals:   04/17/14 1020  BP: 131/75  Pulse: 79  Temp: 98.9 F (37.2 C)  Resp: 18    Intake/Output Summary (Last 24 hours) at 04/17/14 1228 Last data filed at 04/17/14 0900  Gross per 24 hour  Intake    480 ml  Output      0 ml  Net    480 ml   Filed Weights   04/15/14 0925 04/15/14 1737 04/16/14 2037  Weight: 95.255 kg (210 lb) 98.7 kg (217 lb 9.5 oz) 97.115 kg (214 lb 1.6 oz)    Exam:  Physical Exam: Head: Normocephalic, atraumatic.  Eyes: No signs of jaundice, EOMI. Lungs: Normal respiratory effort. B/L Clear to auscultation, no crackles or wheezes.  Heart: Regular RR. S1 and S2 normal  Abdomen: BS normoactive. Soft, Nondistended, non-tender.  Extremities: No pretibial edema, no erythema   Data Reviewed: Basic Metabolic Panel:  Recent Labs Lab 04/15/14 0950 04/15/14 1649 04/16/14 0437  NA 146  --  143  K 4.1  --  4.0  CL 108  --  106  CO2 27  --  24  GLUCOSE 104*  --  85  BUN 17  --  15  CREATININE  0.88 0.91 0.80  CALCIUM 9.4  --  8.9   Liver Function Tests:  Recent Labs Lab 04/15/14 0950 04/16/14 0437  AST 15 14  ALT 10 10  ALKPHOS 76 72  BILITOT 0.4 0.5  PROT 6.2 5.7*  ALBUMIN 3.4* 3.3*   No results for input(s): LIPASE, AMYLASE in the last 168 hours. No results for input(s): AMMONIA in the last 168 hours. CBC:  Recent Labs Lab 04/15/14 0950 04/15/14 1649  04/16/14 0437  WBC 4.3 4.1 4.5  NEUTROABS 2.5  --   --   HGB 13.7 12.5 12.9  HCT 42.1 38.7 40.0  MCV 95.7 95.6 95.0  PLT 145* 159 177   Cardiac Enzymes:  Recent Labs Lab 04/15/14 0950  TROPONINI <0.30   BNP (last 3 results) No results for input(s): PROBNP in the last 8760 hours. CBG:  Recent Labs Lab 04/15/14 1113  GLUCAP 84    Recent Results (from the past 240 hour(s))  MRSA PCR Screening     Status: None   Collection Time: 04/16/14  5:50 AM  Result Value Ref Range Status   MRSA by PCR NEGATIVE NEGATIVE Final    Comment:        The GeneXpert MRSA Assay (FDA approved for NASAL specimens only), is one component of a comprehensive MRSA colonization surveillance program. It is not intended to diagnose MRSA infection nor to guide or monitor treatment for MRSA infections.      Studies: Ct Head Wo Contrast  04/16/2014   CLINICAL DATA:  Syncopal episode yesterday.  EXAM: CT HEAD WITHOUT CONTRAST  TECHNIQUE: Contiguous axial images were obtained from the base of the skull through the vertex without intravenous contrast.  COMPARISON:  02/21/2014  FINDINGS: There is no evidence of acute cortical infarct, intracranial hemorrhage, mass, midline shift, or extra-axial fluid collection. Ventricles and sulci are within normal limits. No significant white matter disease is identified.  Prior bilateral cataract extraction is noted. Small amount of cerumen is noted in the right greater than left external auditory canal is. Hyperostosis frontalis is noted. Visualized paranasal sinuses and mastoid air cells are clear.  IMPRESSION: Unremarkable CT appearance of the brain for age.   Electronically Signed   By: Logan Bores   On: 04/16/2014 11:28   Ct Angio Chest Pe W/cm &/or Wo Cm  04/16/2014   CLINICAL DATA:  Acute onset of left anterior chest pain today.  EXAM: CT ANGIOGRAPHY CHEST WITH CONTRAST  TECHNIQUE: Multidetector CT imaging of the chest was performed using the standard protocol  during bolus administration of intravenous contrast. Multiplanar CT image reconstructions and MIPs were obtained to evaluate the vascular anatomy.  CONTRAST:  64mL OMNIPAQUE IOHEXOL 350 MG/ML SOLN  COMPARISON:  08/22/2012  FINDINGS: There are filling defects within the right upper lobe pulmonary arteries, anterior and posterior segments compatible with moderate-sized pulmonary emboli. No other filling defects are noted.  Heart is enlarged. There is a small pericardial effusion, including fluid within the pericardial recess anterior to the aortic arch. Adjacent to this pericardial recesses an enlarged prevascular lymph node with a short axis diameter of 2.3 cm compared with 8 mm previously.  Coronary artery calcifications are noted. There are trace bilateral pleural effusions. Minimal dependent atelectasis in both lower lungs posteriorly.  Right middle lobe nodule on image 40 measures 10 mm. This previously measured 10 mm. Small right and 2 scratch has small right upper lobe nodule noted peripherally on image 31 is stable as well.  Chest wall soft tissues  are unremarkable. Moderate-sized hiatal hernia containing fat. Multiple hypodensities scattered throughout the liver most likely reflects cysts. High density material is noted within the gallbladder, likely vicarious excretion of contrast related to prior abdominal CT.  Review of the MIP images confirms the above findings.  IMPRESSION: Moderate-sized pulmonary emboli in the right upper lobe. No evidence of right heart strain.  Small pericardial effusion, trace pleural effusions.  Cardiomegaly.  Enlarging prevascular adenopathy since prior chest CT. Cannot exclude metastatic adenopathy or lymphoproliferative disorder.  Right upper lobe and right middle lobe pulmonary nodules are stable since 2014.  These results were called by telephone at the time of interpretation on 04/16/2014 at 11:27 am to patient's nurse Caryl Pina, who verbally acknowledged these results.    Electronically Signed   By: Rolm Baptise M.D.   On: 04/16/2014 11:29   Ct Abdomen Pelvis W Contrast  04/15/2014   CLINICAL DATA:  Intermittent left upper quadrant abdominal pain for several days.  EXAM: CT ABDOMEN AND PELVIS WITH CONTRAST  TECHNIQUE: Multidetector CT imaging of the abdomen and pelvis was performed using the standard protocol following bolus administration of intravenous contrast.  CONTRAST:  121mL OMNIPAQUE IOHEXOL 300 MG/ML  SOLN  COMPARISON:  Abdomen radiographs dated 03/09/2013. Abdomen and pelvis CT dated 04/11/2012.  FINDINGS: Multiple liver cysts are again demonstrated. Bilateral parapelvic renal cysts. No hydronephrosis. Unremarkable spleen, pancreas, gallbladder, adrenal glands, ureters and urinary bladder. Surgically absent uterus. No adnexal masses enlarged lymph nodes.  Interval small amount of linear atelectasis or scarring at the right lung base. Interval oval nodule in the right middle lobe measuring 1.3 cm on image 8. There is also a 4 mm semi-solid nodule in the right upper lobe on image number 2 and a 4 mm nodule in the anterior medial right middle lobe on image 8. Decreased amount of pericardial with a small amount of fluid remaining, measuring 7 mm in maximum thickness.  Stool throughout the majority of the colon. No gastric or small bowel abnormalities. Moderate sized hiatal hernia containing fat without herniated stomach. Bilateral pelvic phleboliths.  IMPRESSION: 1. Interval nodules in the right middle lobe and right upper lobe suspicious for malignancy or a metastatic process, as described above. These could be further characterized with PET-CT if clinically indicated. 2. Minimal pericardial effusion, improved.   Electronically Signed   By: Enrique Sack M.D.   On: 04/15/2014 13:31    Scheduled Meds: . aspirin EC  81 mg Oral Daily  . diltiazem  60 mg Oral QPM  . diltiazem  90 mg Oral q morning - 10a  . enoxaparin (LOVENOX) injection  100 mg Subcutaneous Q12H  .  pantoprazole  40 mg Oral Daily  . QUEtiapine  400 mg Oral QPM  . sodium chloride  3 mL Intravenous Q12H  . warfarin  5 mg Oral ONCE-1800  . Warfarin - Pharmacist Dosing Inpatient   Does not apply q1800   Continuous Infusions: . sodium chloride 50 mL/hr at 04/16/14 1355    Active Problems:   Atrial fibrillation   Syncope   Thrombocytopenia   LOC (loss of consciousness)   Faintness   PE (pulmonary embolism)    Time spent: 25 min    [facility] Pager [phone] If 7PM-7AM, please contact night-coverage at [email or web address], password [facility] 04/17/2014, 12:28 PM  LOS: 2 days

## 2014-04-17 NOTE — Plan of Care (Signed)
Problem: Consults Goal: General Medical Patient Education See Patient Education Module for specific education.  Outcome: Completed/Met Date Met:  04/17/14 Educated patient and daughter about pulmonary embolism and anticoagulant therapy.  Pharmacist educated patient on coumadin and Lovenox.  Coumadin book given to patient.  Patient and daughter verbalized understanding

## 2014-04-17 NOTE — Progress Notes (Signed)
PT Cancellation Note  Patient Details Name: SHERIDYN CANINO MRN: [id number] DOB: 1931/09/05   Cancelled Treatment:    Reason Eval/Treat Not Completed: Medical issues which prohibited therapy (Pt. with new onset PE, subtherapeutic INR)  Will check on pt. Tomorrow.     Ladona Ridgel 04/17/2014, 3:37 PM Gerlean Ren PT Acute Rehab Services [phone] [place]

## 2014-04-17 NOTE — Plan of Care (Signed)
Problem: Phase I Progression Outcomes Goal: Pain controlled with appropriate interventions Outcome: Completed/Met Date Met:  04/17/14 Goal: OOB as tolerated unless otherwise ordered Outcome: Completed/Met Date Met:  04/17/14 Goal: Voiding-avoid urinary catheter unless indicated Outcome: Completed/Met Date Met:  04/17/14 Goal: Other Phase I Outcomes/Goals Outcome: Completed/Met Date Met:  04/17/14

## 2014-04-18 DIAGNOSIS — G3184 Mild cognitive impairment, so stated: Secondary | ICD-10-CM

## 2014-04-18 LAB — PROTIME-INR
INR: 1.15 (ref 0.00–1.49)
PROTHROMBIN TIME: 14.9 s (ref 11.6–15.2)

## 2014-04-18 MED ORDER — QUETIAPINE FUMARATE ER 200 MG PO TB24
200.0000 mg | ORAL_TABLET | Freq: Every evening | ORAL | Status: DC
Start: 2014-04-18 — End: 2014-04-20
  Administered 2014-04-18 – 2014-04-20 (×3): 200 mg via ORAL
  Filled 2014-04-18 (×3): qty 1

## 2014-04-18 MED ORDER — POLYETHYLENE GLYCOL 3350 17 G PO PACK
17.0000 g | PACK | Freq: Every day | ORAL | Status: DC
  Administered 2014-04-18 – 2014-04-20 (×3): 17 g via ORAL
  Filled 2014-04-18 (×3): qty 1

## 2014-04-18 MED ORDER — BISACODYL 10 MG RE SUPP
10.0000 mg | Freq: Every day | RECTAL | Status: DC | PRN
  Administered 2014-04-20: 10 mg via RECTAL
  Filled 2014-04-18: qty 1

## 2014-04-18 MED ORDER — WARFARIN SODIUM 7.5 MG PO TABS
7.5000 mg | ORAL_TABLET | Freq: Once | ORAL | Status: AC
  Administered 2014-04-18: 7.5 mg via ORAL
  Filled 2014-04-18: qty 1

## 2014-04-18 NOTE — Progress Notes (Signed)
ANTICOAGULATION CONSULT NOTE - Follow Up Consult  Pharmacy Consult for coumadin and lovenox Indication: pulmonary embolus  Allergies  Allergen Reactions  . Diphenhydramine Other (See Comments)    UNKNOWN  . Flagyl [Metronidazole] Other (See Comments)    HALLUCINATIONS  . Hydrocodone Other (See Comments)    HALLUCINATIONS  . Meperidine Hcl Nausea And Vomiting  . Penicillins Hives  . Sulfonamide Derivatives Hives    Patient Measurements: Height: 5\' 2"  (157.5 cm) Weight: 214 lb 6.4 oz (97.251 kg) IBW/kg (Calculated) : 50.1 Heparin Dosing Weight:   Vital Signs: Temp: 98.3 F (36.8 C) (11/08 0937) Temp Source: Oral (11/08 0937) BP: 93/77 mmHg (11/08 0937) Pulse Rate: 83 (11/08 0937)  Labs:  Recent Labs  04/15/14 1649 04/16/14 0437 04/16/14 1230 04/17/14 0459 04/18/14 0705  HGB 12.5 12.9  --   --   --   HCT 38.7 40.0  --   --   --   PLT 159 177  --   --   --   LABPROT  --   --  14.2 14.5 14.9  INR  --   --  1.08 1.12 1.15  CREATININE 0.91 0.80  --   --   --     Estimated Creatinine Clearance: 61.1 mL/min (by C-G formula based on Cr of 0.8).   Medications:  Scheduled:  . aspirin EC  81 mg Oral Daily  . diltiazem  60 mg Oral 4 times per day  . enoxaparin (LOVENOX) injection  100 mg Subcutaneous Q12H  . pantoprazole  40 mg Oral Daily  . QUEtiapine  200 mg Oral QPM  . sodium chloride  3 mL Intravenous Q12H  . Warfarin - Pharmacist Dosing Inpatient   Does not apply q1800   Infusions:  . sodium chloride 50 mL/hr at 04/16/14 1355    Assessment: 80 yo female with PE is currently on subtherapeutic coumadin bridging with treatment dose of lovenox.  Day 3 of 5 of VTE overlap. Goal of Therapy:  Anti-Xa level 0.6-1 units/ml 4hrs after LMWH dose given; INR 2-3 Monitor platelets by anticoagulation protocol: Yes   Plan:  - Cont lovenox 100 mg bid  - Warfarin 7.5 mg po x1 - CBC every 72 hours and daily PT/INR  Jamell Laymon, [place] 04/18/2014,11:05 AM

## 2014-04-18 NOTE — Evaluation (Signed)
Physical Therapy Evaluation Patient Details Name: Monica Strong MRN: [id number] DOB: 05/05/1932 Today's Date: 04/18/2014   History of Present Illness  Admitted with unresponsive episode; found PE, currently on anticoagulation  Clinical Impression  Pt admitted with above. Pt currently with functional limitations due to the deficits listed below (see PT Problem List).  Pt will benefit from skilled PT to increase their independence and safety with mobility to allow discharge to the venue listed below.   RN and MD aware that PT will see pt before INR is within range of 2-3, and MD is okay with mobilizing.      Follow Up Recommendations Home health PT;Supervision - Intermittent  At ALF -- would like to know how much assist is available to pt at ALF    Equipment Recommendations       Recommendations for Other Services       Precautions / Restrictions Precautions Precautions: Fall      Mobility  Bed Mobility Overal bed mobility: Needs Assistance Bed Mobility: Supine to Sit     Supine to sit: Supervision     General bed mobility comments: Cues and encouragement to participate; requires extra time  Transfers Overall transfer level: Needs assistance Equipment used: 1 person hand held assist Transfers: Sit to/from Stand Sit to Stand: Min guard         General transfer comment: mingurad, handheld assis tfor steadiness  Ambulation/Gait Ambulation/Gait assistance: Min assist;Mod assist Ambulation Distance (Feet): 15 Feet Assistive device: 1 person hand held assist   Gait velocity: Decreased   General Gait Details: slow small steps; one episode of losing balance backward, and pt saying "I'm going down",  required mod assis to regian balance -- this episode may have coincided with her HR incr to 140 bpm as reproted by central cardiac monitoring; RN and MD notified  Stairs            Wheelchair Mobility    Modified Rankin (Stroke Patients Only)       Balance                                              Pertinent Vitals/Pain Pain Assessment: 0-10 Pain Score: 7  Pain Location: Pt's hands on her belly; she describes the pain as "hernia pain" Pain Descriptors / Indicators:  (Hernia pain) Pain Intervention(s): Repositioned    Home Living Family/patient expects to be discharged to:: Assisted living Living Arrangements: Other (Comment);Alone (Assisted Living move 04/09/14) Available Help at Discharge: Available 24 hours/day Type of Home: Assisted living Home Access: Level entry     Home Layout: One level Home Equipment: Cane - single point;Walker - 4 wheels Additional Comments: no family present for info and patient is unable to provide. Patient did state she still drives. ? accuracy. pt is very flat and catatonic.    Prior Function Level of Independence: Independent with assistive device(s)         Comments: uses cane for ambulation     Hand Dominance   Dominant Hand: Right    Extremity/Trunk Assessment   Upper Extremity Assessment: Defer to OT evaluation           Lower Extremity Assessment: Generalized weakness         Communication   Communication: No difficulties  Cognition Arousal/Alertness: Awake/alert Behavior During Therapy: WFL for tasks assessed/performed;Agitated Overall Cognitive Status: No family/caregiver present  to determine baseline cognitive functioning                      General Comments      Exercises        Assessment/Plan    PT Assessment Patient needs continued PT services  PT Diagnosis Generalized weakness   PT Problem List Decreased strength;Decreased activity tolerance;Decreased balance;Decreased mobility;Decreased knowledge of use of DME;Cardiopulmonary status limiting activity  PT Treatment Interventions DME instruction;Gait training;Functional mobility training;Therapeutic activities;Therapeutic exercise;Balance training;Neuromuscular  re-education;Patient/family education   PT Goals (Current goals can be found in the Care Plan section) Acute Rehab PT Goals Patient Stated Goal: Pt clearly wanting to eat breakfast PT Goal Formulation: With patient Time For Goal Achievement: 05/02/14    Frequency Min 3X/week   Barriers to discharge   Would like to know how much assist Nanine Means is able to provide Ms. Deegan    Co-evaluation               End of Session   Activity Tolerance: Patient tolerated treatment well Patient left: in chair;with call bell/phone within reach;with chair alarm set Nurse Communication: Mobility status         Time: 7619-5093 PT Time Calculation (min): 24 min   Charges:   PT Evaluation $Initial PT Evaluation Tier I: 1 Procedure PT Treatments $Gait Training: 8-22 mins   PT G Codes:          Quin Hoop 04/18/2014, 1:06 PM  Roney Marion, [place] Pager [phone] Office [phone]

## 2014-04-18 NOTE — Progress Notes (Signed)
[facility] PROGRESS NOTE  Monica Strong IZT:[id number] DOB: 12/29/1931 DOA: 04/15/2014 PCP: Vena Austria, MD  Assessment/Plan: 1. Pulmonary embolism- CT angiogram shows moderate size pulmonary emboli in the right upper lobe.we'll start the patient on heparin per pharmacy consultation and Coumadin. 2. Syncope versus seizure- EEG shows mild loss continuous nonspecific slowing of the cerebral activity consistent with metabolic or toxic encephalopathy., likely patient had syncope from pulmonary embolism. Neurology has seen the patient.CT head is negative for any acute abnormality. 3. Lung mass.- CT chest shows patient has right middle and upper lobe nodules,enlarging prevascular adenopathy . I discussed in detail with family including both daughters, they'll follow up with pulmonary as outpatient for further workup.Called and discussed with Dr. Chase Caller, who will see the patient in 4 weeks in the office 4. Atrial fibrillation- Heart rate is not controlled continue Cardizem. Patient is on Coumadin. Continue aspirin 81 mg by mouth daily.Cardizem dose increased to 60 mg 4 times a day. Will consult cardiologyfor further adjustment of the medications. 5. Somnolence/Paranoia- Patient was taking Seroquel 75 mg po daily in September, and the dose of Seroquel was increased to 400 mg po daily. Patient is now excessively somnolent. I will decrease the dose of Seroquel to 200 mg by mouth daily. Will need medication adjustment.Will get Psych consult.   Code Status: full code Family Communication: *spoke with daughter at bedside and also another daughter on phone Disposition Plan: home when stable   Consultants:  none  Procedures:  none  Antibiotics: None  HPI/Subjective: 80 year old female was brought to the hospital by EMS after patient was found to be unresponsive at the assisted living facility. Patient is a poor historian, but remembers she was about to get the phone and after that  everything went blank. There was no seizure-like activity noted by the nursing staff, she was found to be slumped in the chair. Patient was unresponsive for about 10 minutes, though there was no loss of bowel or bladder control, no biting of tongue. In the ED patient was seen by neurology who recommended patient to be admitted for further evaluation including EEG.she also complained of left-sided abdominal pain,CT abdomen pelvis was done which showed interval nodules in the right middle lobe and right upper lobe suspicious for malignancy. She denies chest pain, no shortness of breath no nausea vomiting or diarrhea. She denies palpitations no fever no dysuria urgency or frequency of urination.  Patient see and examined, heart rate going to 130s 140s on exertion.  Objective: Filed Vitals:   04/18/14 0937  BP: 93/77  Pulse: 83  Temp: 98.3 F (36.8 C)  Resp: 21    Intake/Output Summary (Last 24 hours) at 04/18/14 1359 Last data filed at 04/18/14 0900  Gross per 24 hour  Intake    480 ml  Output      0 ml  Net    480 ml   Filed Weights   04/15/14 1737 04/16/14 2037 04/17/14 2018  Weight: 98.7 kg (217 lb 9.5 oz) 97.115 kg (214 lb 1.6 oz) 97.251 kg (214 lb 6.4 oz)    Exam:  Physical Exam: Head: Normocephalic, atraumatic.  Eyes: No signs of jaundice, EOMI. Lungs: Normal respiratory effort. B/L Clear to auscultation, no crackles or wheezes.  Heart: irregular. S1 and S2 normal  Abdomen: BS normoactive. Soft, Nondistended, non-tender.  Extremities: No pretibial edema, no erythema   Data Reviewed: Basic Metabolic Panel:  Recent Labs Lab 04/15/14 0950 04/15/14 1649 04/16/14 0437  NA 146  --  143  K 4.1  --  4.0  CL 108  --  106  CO2 27  --  24  GLUCOSE 104*  --  85  BUN 17  --  15  CREATININE 0.88 0.91 0.80  CALCIUM 9.4  --  8.9   Liver Function Tests:  Recent Labs Lab 04/15/14 0950 04/16/14 0437  AST 15 14  ALT 10 10  ALKPHOS 76 72  BILITOT 0.4 0.5  PROT 6.2  5.7*  ALBUMIN 3.4* 3.3*   No results for input(s): LIPASE, AMYLASE in the last 168 hours. No results for input(s): AMMONIA in the last 168 hours. CBC:  Recent Labs Lab 04/15/14 0950 04/15/14 1649 04/16/14 0437  WBC 4.3 4.1 4.5  NEUTROABS 2.5  --   --   HGB 13.7 12.5 12.9  HCT 42.1 38.7 40.0  MCV 95.7 95.6 95.0  PLT 145* 159 177   Cardiac Enzymes:  Recent Labs Lab 04/15/14 0950  TROPONINI <0.30   BNP (last 3 results) No results for input(s): PROBNP in the last 8760 hours. CBG:  Recent Labs Lab 04/15/14 1113  GLUCAP 84    Recent Results (from the past 240 hour(s))  MRSA PCR Screening     Status: None   Collection Time: 04/16/14  5:50 AM  Result Value Ref Range Status   MRSA by PCR NEGATIVE NEGATIVE Final    Comment:        The GeneXpert MRSA Assay (FDA approved for NASAL specimens only), is one component of a comprehensive MRSA colonization surveillance program. It is not intended to diagnose MRSA infection nor to guide or monitor treatment for MRSA infections.      Studies: No results found.  Scheduled Meds: . aspirin EC  81 mg Oral Daily  . diltiazem  60 mg Oral 4 times per day  . enoxaparin (LOVENOX) injection  100 mg Subcutaneous Q12H  . pantoprazole  40 mg Oral Daily  . QUEtiapine  200 mg Oral QPM  . sodium chloride  3 mL Intravenous Q12H  . warfarin  7.5 mg Oral ONCE-1800  . Warfarin - Pharmacist Dosing Inpatient   Does not apply q1800   Continuous Infusions: . sodium chloride 50 mL/hr at 04/16/14 1355    Active Problems:   Atrial fibrillation   Syncope   Thrombocytopenia   LOC (loss of consciousness)   Faintness   PE (pulmonary embolism)    Time spent: 25 min    [facility] Pager [phone] If 7PM-7AM, please contact night-coverage at [email or web address], password [facility] 04/18/2014, 1:59 PM  LOS: 3 days
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
## 2014-04-18 NOTE — Plan of Care (Signed)
Problem: Phase II Progression Outcomes Goal: Progress activity as tolerated unless otherwise ordered Outcome: Completed/Met Date Met:  04/18/14 Goal: Vital signs remain stable Outcome: Completed/Met Date Met:  04/18/14 Goal: Obtain order to discontinue catheter if appropriate Outcome: Not Applicable Date Met:  09/82/86 Goal: Other Phase II Outcomes/Goals Outcome: Completed/Met Date Met:  04/18/14  Problem: Phase III Progression Outcomes Goal: Pain controlled on oral analgesia Outcome: Completed/Met Date Met:  04/18/14 Goal: Activity at appropriate level-compared to baseline (UP IN CHAIR FOR HEMODIALYSIS)  Outcome: Completed/Met Date Met:  04/18/14 Goal: Voiding independently Outcome: Completed/Met Date Met:  04/18/14 Goal: Foley discontinued Outcome: Not Applicable Date Met:  75/19/82

## 2014-04-19 DIAGNOSIS — F29 Unspecified psychosis not due to a substance or known physiological condition: Secondary | ICD-10-CM

## 2014-04-19 LAB — CBC
HCT: 39.1 % (ref 36.0–46.0)
Hemoglobin: 12.5 g/dL (ref 12.0–15.0)
MCH: 31.2 pg (ref 26.0–34.0)
MCHC: 32 g/dL (ref 30.0–36.0)
MCV: 97.5 fL (ref 78.0–100.0)
PLATELETS: 168 10*3/uL (ref 150–400)
RBC: 4.01 MIL/uL (ref 3.87–5.11)
RDW: 13.9 % (ref 11.5–15.5)
WBC: 4.3 10*3/uL (ref 4.0–10.5)

## 2014-04-19 LAB — CREATININE, SERUM
CREATININE: 0.8 mg/dL (ref 0.50–1.10)
GFR calc Af Amer: 79 mL/min — ABNORMAL LOW (ref >90)
GFR, EST NON AFRICAN AMERICAN: 68 mL/min — AB (ref >90)

## 2014-04-19 LAB — PROTIME-INR
INR: 1.16 (ref 0.00–1.49)
Prothrombin Time: 15 seconds (ref 11.6–15.2)

## 2014-04-19 MED ORDER — WARFARIN SODIUM 10 MG PO TABS
10.0000 mg | ORAL_TABLET | Freq: Once | ORAL | Status: AC
  Administered 2014-04-19: 10 mg via ORAL
  Filled 2014-04-19: qty 1

## 2014-04-19 MED ORDER — ENOXAPARIN SODIUM 100 MG/ML ~~LOC~~ SOLN
100.0000 mg | Freq: Two times a day (BID) | SUBCUTANEOUS | Status: DC
  Administered 2014-04-19 – 2014-04-20 (×2): 100 mg via SUBCUTANEOUS
  Filled 2014-04-19 (×4): qty 1

## 2014-04-19 NOTE — Clinical Social Work Psychosocial (Signed)
Clinical Social Work Department BRIEF PSYCHOSOCIAL ASSESSMENT 04/19/2014  Patient:  Monica Strong, Monica Strong     Account Number:  [id number]     Admit date:  04/15/2014  Clinical Social Worker:  Domenica Reamer, [place]  Date/Time:  04/19/2014 04:44 PM  Referred by:  Physician  Date Referred:  04/16/2014 Referred for  ALF Placement   Other Referral:   Interview type:  Family Other interview type:    PSYCHOSOCIAL DATA Living Status:  FACILITY Admitted from facility:  Other Level of care:  Assisted Living Primary support name:  Shirlean Mylar Primary support relationship to patient:  CHILD, ADULT Degree of support available:   high level of support from daughter    CURRENT CONCERNS Current Concerns  Post-Acute Placement   Other Concerns:    SOCIAL WORK ASSESSMENT / PLAN CSW spoke with patients daughter concerning return to [facility].  Patients daughter   Assessment/plan status:  Psychosocial Support/Ongoing Assessment of Needs Other assessment/ plan:   FL2 update   Information/referral to community resources:   [facility]    PATIENT'S/FAMILY'S RESPONSE TO PLAN OF CARE: Patients family is agreeable to return to SNF.       Domenica Reamer, [place] Social Worker [phone]

## 2014-04-19 NOTE — Care Management Note (Signed)
CARE MANAGEMENT NOTE 04/19/2014  Patient:  Monica Strong, Monica Strong   Account Number:  [id number]  Date Initiated:  04/19/2014  Documentation initiated by:  Kyley Laurel  Subjective/Objective Assessment:   CM following for progression and d/c planning.     Action/Plan:   04/19/2014 Ongoing efforts to complete Lovenox to Coumadin bridge and return pt to ALF.   Anticipated DC Date:     Anticipated DC Plan:  ASSISTED LIVING / REST HOME         Choice offered to / List presented to:             Status of service:  In process, will continue to follow Medicare Important Message given?  YES (If response is "NO", the following Medicare IM given date fields will be blank) Date Medicare IM given:  04/19/2014 Medicare IM given by:   Date Additional Medicare IM given:   Additional Medicare IM given by:    Discharge Disposition:    Per UR Regulation:    If discussed at Long Length of Stay Meetings, dates discussed:    Comments:  04/19/2014 CSW following for return to ALF when INR in theraputic range.  CRoyal RN MPH, case manager, [phone]

## 2014-04-19 NOTE — Progress Notes (Signed)
ANTICOAGULATION CONSULT NOTE - Follow Up Consult  Pharmacy Consult for coumadin and lovenox Indication: pulmonary embolus  Allergies  Allergen Reactions  . Diphenhydramine Other (See Comments)    UNKNOWN  . Flagyl [Metronidazole] Other (See Comments)    HALLUCINATIONS  . Hydrocodone Other (See Comments)    HALLUCINATIONS  . Meperidine Hcl Nausea And Vomiting  . Penicillins Hives  . Sulfonamide Derivatives Hives    Patient Measurements: Height: 5\' 2"  (157.5 cm) Weight: 213 lb 6.5 oz (96.8 kg) IBW/kg (Calculated) : 50.1   Vital Signs: Temp: 97.3 F (36.3 C) (11/09 0500) Temp Source: Oral (11/09 0500) BP: 113/71 mmHg (11/09 0500) Pulse Rate: 69 (11/09 0500)  Labs:  Recent Labs  04/17/14 0459 04/18/14 0705 04/19/14 0605  HGB  --   --  12.5  HCT  --   --  39.1  PLT  --   --  168  LABPROT 14.5 14.9 15.0  INR 1.12 1.15 1.16  CREATININE  --   --  0.80    Estimated Creatinine Clearance: 60.9 mL/min (by C-G formula based on Cr of 0.8).  Assessment: 80 yo female with PE currently on warfarin bridging with treatment dose of lovenox.  Today is day 4 of 5 minimum of VTE overlap- INR needs to be therapeutic x2 before Lovenox can be discontinued.  INR has been resistant to increasing warfarin dosing. She has not missed a dose, yet her INR has barely moved from its baseline value of 1.08 to today's reading of 1.16.  Last BMET is from 11/6- SCr 0.8 with est CrCL ~24mL/min.  Goal of Therapy:  INR 2-3 Anti-Xa level 0.6-1 units/ml 4hrs after LMWH dose given Monitor platelets by anticoagulation protocol: Yes   Plan:  - Cont Lovenox 100 mg subQ q12h- adjusted to be given at noon and midnight today. Will adjust again tomorrow to 1100 and 2300 - Warfarin 10mg  po x1 tonight  - CBC every 72 hours - daily PT/INR - will follow for s/s bleeding  Lady Wisham D. Dorismar Chay, PharmD, BCPS Clinical Pharmacist Pager: [phone] 04/19/2014 8:55 AM

## 2014-04-19 NOTE — Progress Notes (Signed)
Physical Therapy Treatment Patient Details Name: Monica Strong MRN: [id number] DOB: 07-08-1931 Today's Date: 04/19/2014    History of Present Illness Admitted with unresponsive episode; found PE, currently on anticoagulation    PT Comments    Pt progressing towards physical therapy goals. Demonstrated increased ambulation distance, with a moderate amount of fatigue reported at end of gait training. Feel that pt would greatly benefit from a RW or 4WW, however pt is unwilling to even try a walker at this point. Continue to recommend d/c back to ALF at d/c with HHPT to follow.   Follow Up Recommendations  Home health PT;Supervision - Intermittent (Back to ALF)     Equipment Recommendations  None recommended by PT    Recommendations for Other Services       Precautions / Restrictions Precautions Precautions: Fall Restrictions Weight Bearing Restrictions: No    Mobility  Bed Mobility Overal bed mobility: Needs Assistance Bed Mobility: Supine to Sit     Supine to sit: Supervision     General bed mobility comments: Supervision for safety - no physical assist required.   Transfers Overall transfer level: Needs assistance Equipment used: 1 person hand held assist Transfers: Sit to/from Stand Sit to Stand: Min assist         General transfer comment: Steadying assist for balance. Pt required increased time to power-up to full standing position.   Ambulation/Gait Ambulation/Gait assistance: Min assist Ambulation Distance (Feet): 250 Feet Assistive device: 1 person hand held assist Gait Pattern/deviations: Step-through pattern;Decreased stride length;Trunk flexed Gait velocity: Decreased Gait velocity interpretation: Below normal speed for age/gender General Gait Details: HHA x1. Pt putting weight through therapist's hand for support. Declined use of the RW however feel this would be a great option for the pt for energy conservation as well as balance.    Stairs             Wheelchair Mobility    Modified Rankin (Stroke Patients Only)       Balance Overall balance assessment: Needs assistance Sitting-balance support: Feet supported;No upper extremity supported Sitting balance-Leahy Scale: Good     Standing balance support: Single extremity supported;During functional activity Standing balance-Leahy Scale: Fair Standing balance comment: Pt is able to stand and maintain balance without assist, however for any dynamic movement would recommend assistance.                     Cognition Arousal/Alertness: Awake/alert Behavior During Therapy: WFL for tasks assessed/performed;Agitated Overall Cognitive Status: Within Functional Limits for tasks assessed                      Exercises General Exercises - Lower Extremity Ankle Circles/Pumps: 20 reps Quad Sets: 10 reps Long Arc Quad: 15 reps Hip ABduction/ADduction: 10 reps    General Comments        Pertinent Vitals/Pain Pain Assessment: No/denies pain    Home Living                      Prior Function            PT Goals (current goals can now be found in the care plan section) Acute Rehab PT Goals Patient Stated Goal: Feel better.  PT Goal Formulation: With patient Time For Goal Achievement: 05/02/14 Potential to Achieve Goals: Good Progress towards PT goals: Progressing toward goals    Frequency  Min 3X/week    PT Plan Current plan remains appropriate  Co-evaluation             End of Session Equipment Utilized During Treatment: Gait belt Activity Tolerance: Patient tolerated treatment well Patient left: in chair;with call bell/phone within reach;with chair alarm set     Time: 1425-1452 PT Time Calculation (min): 27 min  Charges:  $Gait Training: 8-22 mins $Therapeutic Exercise: 8-22 mins                    G Codes:      [place], [place] May 10, 2014, 3:57 PM   Rolinda Roan, PT, DPT Acute Rehabilitation Services Pager:  [phone]

## 2014-04-19 NOTE — Progress Notes (Signed)
[facility] PROGRESS NOTE  Monica Strong DXI:[id number] DOB: [birth year] DOA: 04/15/2014 PCP: Monica Austria, MD  Assessment/Plan: 1. Pulmonary embolism- CT angiogram shows moderate size pulmonary emboli in the right upper lobe.continue bridging with Lovenox till her INR is therapeutic. 2. Syncope versus seizure- EEG shows mild loss continuous nonspecific slowing of the cerebral activity consistent with metabolic or toxic encephalopathy., likely patient had syncope from pulmonary embolism. Neurology has seen the patient.CT head is negative for any acute abnormality. 3. Lung mass.- CT chest shows patient has right middle and upper lobe nodules,enlarging prevascular adenopathy . I discussed in detail with family including both daughters, they'll follow up with pulmonary as outpatient for further workup.Called and discussed with Dr. Chase Strong, who will see the patient in 4 weeks in the office 4. Atrial fibrillation- Heart rate is not controlled continue Cardizem. Patient is on Coumadin. Continue aspirin 81 mg by mouth daily.Cardizem dose increased to 60 mg 4 times a day. Will consult cardiologyfor further adjustment of the medications. 5. Somnolence/Paranoia- Patient was taking Seroquel 75 mg po daily in September, and the dose of Seroquel was increased to 400 mg po daily. Patient is now excessively somnolent. Seroquel was decreased to to 200 mg by mouth daily.psychiatric consult appreciated.   Code Status: full code Family Communication: none at bedside Disposition Plan: home when stable   Consultants:  psychiatrically  Procedures:  none  Antibiotics: None  HPI/Subjective: 81 year old female was brought to the hospital by EMS after patient was found to be unresponsive at the assisted living facility. Patient is a poor historian, but remembers she was about to get the phone and after that everything went blank. There was no seizure-like activity noted by the nursing staff, she  was found to be slumped in the chair. Patient was unresponsive for about 10 minutes, though there was no loss of bowel or bladder control, no biting of tongue. In the ED patient was seen by neurology who recommended patient to be admitted for further evaluation including EEG.she also complained of left-sided abdominal pain,CT abdomen pelvis was done which showed interval nodules in the right middle lobe and right upper lobe suspicious for malignancy. She denies chest pain, no shortness of breath no nausea vomiting or diarrhea. She denies palpitations no fever no dysuria urgency or frequency of urination.    Objective: Filed Vitals:   04/19/14 1221  BP: 119/46  Pulse:   Temp:   Resp:     Intake/Output Summary (Last 24 hours) at 04/19/14 1444 Last data filed at 04/19/14 0900  Gross per 24 hour  Intake    480 ml  Output      0 ml  Net    480 ml   Filed Weights   04/16/14 2037 04/17/14 2018 04/18/14 2026  Weight: 97.115 kg (214 lb 1.6 oz) 97.251 kg (214 lb 6.4 oz) 96.8 kg (213 lb 6.5 oz)    Exam:  Physical Exam: Head: Normocephalic, atraumatic.  Eyes: No signs of jaundice, EOMI. Lungs: Normal respiratory effort. B/L Clear to auscultation, no crackles or wheezes.  Heart: irregular. S1 and S2 normal  Abdomen: BS normoactive. Soft, Nondistended, non-tender.  Extremities: No pretibial edema, no erythema   Data Reviewed: Basic Metabolic Panel:  Recent Labs Lab 04/15/14 0950 04/15/14 1649 04/16/14 0437 04/19/14 0605  NA 146  --  143  --   K 4.1  --  4.0  --   CL 108  --  106  --   CO2 27  --  24  --  GLUCOSE 104*  --  85  --   BUN 17  --  15  --   CREATININE 0.88 0.91 0.80 0.80  CALCIUM 9.4  --  8.9  --    Liver Function Tests:  Recent Labs Lab 04/15/14 0950 04/16/14 0437  AST 15 14  ALT 10 10  ALKPHOS 76 72  BILITOT 0.4 0.5  PROT 6.2 5.7*  ALBUMIN 3.4* 3.3*   No results for input(s): LIPASE, AMYLASE in the last 168 hours. No results for input(s): AMMONIA  in the last 168 hours. CBC:  Recent Labs Lab 04/15/14 0950 04/15/14 1649 04/16/14 0437 04/19/14 0605  WBC 4.3 4.1 4.5 4.3  NEUTROABS 2.5  --   --   --   HGB 13.7 12.5 12.9 12.5  HCT 42.1 38.7 40.0 39.1  MCV 95.7 95.6 95.0 97.5  PLT 145* 159 177 168   Cardiac Enzymes:  Recent Labs Lab 04/15/14 0950  TROPONINI <0.30   BNP (last 3 results) No results for input(s): PROBNP in the last 8760 hours. CBG:  Recent Labs Lab 04/15/14 1113  GLUCAP 84    Recent Results (from the past 240 hour(s))  MRSA PCR Screening     Status: None   Collection Time: 04/16/14  5:50 AM  Result Value Ref Range Status   MRSA by PCR NEGATIVE NEGATIVE Final    Comment:        The GeneXpert MRSA Assay (FDA approved for NASAL specimens only), is one component of a comprehensive MRSA colonization surveillance program. It is not intended to diagnose MRSA infection nor to guide or monitor treatment for MRSA infections.      Studies: No results found.  Scheduled Meds: . aspirin EC  81 mg Oral Daily  . diltiazem  60 mg Oral 4 times per day  . enoxaparin (LOVENOX) injection  100 mg Subcutaneous Q12H  . pantoprazole  40 mg Oral Daily  . polyethylene glycol  17 g Oral Daily  . QUEtiapine  200 mg Oral QPM  . sodium chloride  3 mL Intravenous Q12H  . warfarin  10 mg Oral ONCE-1800  . Warfarin - Pharmacist Dosing Inpatient   Does not apply q1800   Continuous Infusions:    Principal Problem:   Psychosis Active Problems:   Atrial fibrillation   Syncope   Thrombocytopenia   LOC (loss of consciousness)   Faintness   PE (pulmonary embolism)    Time spent: 25 min    Monica Strong  [facility] If 7PM-7AM, please contact night-coverage at [email or web address], password [facility] 04/19/2014, 2:44 PM  LOS: 4 days

## 2014-04-19 NOTE — Plan of Care (Signed)
Problem: Phase II Progression Outcomes Goal: IV changed to normal saline lock Outcome: Completed/Met Date Met:  04/19/14     

## 2014-04-20 LAB — BASIC METABOLIC PANEL
Anion gap: 12 (ref 5–15)
BUN: 19 mg/dL (ref 6–23)
CO2: 25 mEq/L (ref 19–32)
CREATININE: 0.84 mg/dL (ref 0.50–1.10)
Calcium: 9 mg/dL (ref 8.4–10.5)
Chloride: 107 mEq/L (ref 96–112)
GFR, EST AFRICAN AMERICAN: 74 mL/min — AB (ref >90)
GFR, EST NON AFRICAN AMERICAN: 64 mL/min — AB (ref >90)
Glucose, Bld: 97 mg/dL (ref 70–99)
Potassium: 4.1 mEq/L (ref 3.7–5.3)
Sodium: 144 mEq/L (ref 137–147)

## 2014-04-20 LAB — CBC
HCT: 40.2 % (ref 36.0–46.0)
Hemoglobin: 12.6 g/dL (ref 12.0–15.0)
MCH: 30.7 pg (ref 26.0–34.0)
MCHC: 31.3 g/dL (ref 30.0–36.0)
MCV: 97.8 fL (ref 78.0–100.0)
PLATELETS: 161 10*3/uL (ref 150–400)
RBC: 4.11 MIL/uL (ref 3.87–5.11)
RDW: 14.1 % (ref 11.5–15.5)
WBC: 4.1 10*3/uL (ref 4.0–10.5)

## 2014-04-20 LAB — PROTIME-INR
INR: 1.45 (ref 0.00–1.49)
PROTHROMBIN TIME: 17.8 s — AB (ref 11.6–15.2)

## 2014-04-20 LAB — GLUCOSE, CAPILLARY: Glucose-Capillary: 105 mg/dL — ABNORMAL HIGH (ref 70–99)

## 2014-04-20 MED ORDER — ENOXAPARIN SODIUM 100 MG/ML ~~LOC~~ SOLN: 100.0000 mg | Freq: Two times a day (BID) | SUBCUTANEOUS | Status: DC

## 2014-04-20 MED ORDER — DILTIAZEM HCL 60 MG PO TABS: 60.0000 mg | ORAL_TABLET | Freq: Four times a day (QID) | ORAL | Status: DC

## 2014-04-20 MED ORDER — POLYETHYLENE GLYCOL 3350 17 G PO PACK: 17.0000 g | PACK | Freq: Every day | ORAL | Status: AC

## 2014-04-20 MED ORDER — WARFARIN SODIUM 10 MG PO TABS
10.0000 mg | ORAL_TABLET | Freq: Once | ORAL | Status: AC
  Administered 2014-04-20: 10 mg via ORAL
  Filled 2014-04-20: qty 1

## 2014-04-20 MED ORDER — QUETIAPINE FUMARATE ER 200 MG PO TB24: 200.0000 mg | ORAL_TABLET | Freq: Every evening | ORAL | Status: DC

## 2014-04-20 MED ORDER — WARFARIN SODIUM 5 MG PO TABS: 5.0000 mg | ORAL_TABLET | Freq: Every day | ORAL | Status: DC

## 2014-04-20 MED ORDER — HYDROCODONE-ACETAMINOPHEN 5-325 MG PO TABS: 1.0000 | ORAL_TABLET | Freq: Four times a day (QID) | ORAL | Status: DC | PRN

## 2014-04-20 MED ORDER — FLEET ENEMA 7-19 GM/118ML RE ENEM
1.0000 | ENEMA | Freq: Once | RECTAL | Status: AC
  Administered 2014-04-20: 1 via RECTAL
  Filled 2014-04-20: qty 1

## 2014-04-20 MED ORDER — ASPIRIN 81 MG PO TBEC: 81.0000 mg | DELAYED_RELEASE_TABLET | Freq: Every day | ORAL | Status: DC

## 2014-04-20 MED ORDER — ENOXAPARIN SODIUM 100 MG/ML ~~LOC~~ SOLN
100.0000 mg | Freq: Two times a day (BID) | SUBCUTANEOUS | Status: AC
  Administered 2014-04-20 (×2): 100 mg via SUBCUTANEOUS
  Filled 2014-04-20 (×2): qty 1

## 2014-04-20 NOTE — Clinical Social Work Note (Signed)
Patient discharging back to [facility] today, transported by daughter Jamal Maes. Discharge information transmitted to facility and reviewed by nursing director Sonia Baller. Daughter will take discharge packet to facility.  Cavon Nicolls Givens, MSW, LCSW [phone]

## 2014-04-20 NOTE — Progress Notes (Signed)
Patient has had good bowel movement post enema treatment.No complaints.Discharged to assisted living with daughter to drive her back to facility.

## 2014-04-20 NOTE — Progress Notes (Signed)
ANTICOAGULATION CONSULT NOTE - Follow Up Consult  Pharmacy Consult for coumadin and lovenox Indication: pulmonary embolus  Allergies  Allergen Reactions  . Diphenhydramine Other (See Comments)    UNKNOWN  . Flagyl [Metronidazole] Other (See Comments)    HALLUCINATIONS  . Hydrocodone Other (See Comments)    HALLUCINATIONS  . Meperidine Hcl Nausea And Vomiting  . Penicillins Hives  . Sulfonamide Derivatives Hives    Patient Measurements: Height: 5\' 2"  (157.5 cm) Weight: 213 lb 6.5 oz (96.8 kg) IBW/kg (Calculated) : 50.1   Vital Signs: Temp: 98 F (36.7 C) (11/10 1013) Temp Source: Oral (11/10 1013) BP: 107/55 mmHg (11/10 1013) Pulse Rate: 54 (11/10 1013)  Labs:  Recent Labs  04/18/14 0705 04/19/14 0605 04/20/14 0533  HGB  --  12.5 12.6  HCT  --  39.1 40.2  PLT  --  168 161  LABPROT 14.9 15.0 17.8*  INR 1.15 1.16 1.45  CREATININE  --  0.80 0.84    Estimated Creatinine Clearance: 58 mL/min (by C-G formula based on Cr of 0.84).  Assessment: 80 yo female with PE currently on warfarin bridging with treatment dose of lovenox.  Today is day 4 of 5 minimum of VTE overlap- INR needs to be therapeutic x2 before Lovenox can be discontinued.   INR has been slow to move despite dose increases. INR this morning 1.45- this is more reflective of 7.5mg  dose that was given on 11/8- anticipate INR to be ~1.8 in the morning  SCr 0.84 with est CrCL ~55mL/min.  Goal of Therapy:  INR 2-3 Anti-Xa level 0.6-1 units/ml 4hrs after LMWH dose given Monitor platelets by anticoagulation protocol: Yes   Plan:  - Cont Lovenox 100 mg subQ q12h- adjusted to be given at 1100 and 2300 today- will adjust to 1000 and 2200 tomorrow - Warfarin 10mg  po x1 tonight - CBC every 72 hours - daily PT/INR - will follow for s/s bleeding  Santi Troung D. Devarious Pavek, PharmD, BCPS Clinical Pharmacist Pager: [phone] 04/20/2014 10:59 AM

## 2014-04-20 NOTE — Plan of Care (Signed)
Problem: Phase I Progression Outcomes Goal: Initial discharge plan identified Outcome: Not Applicable Date Met:  94/47/39 Goal: Hemodynamically stable Outcome: Completed/Met Date Met:  04/20/14

## 2014-04-20 NOTE — Telephone Encounter (Signed)
Never seen pulmonary before. Rfer to [place] or [facility] or [facility] for ne consult given my travels  Thanks  Dr. Brand Males, M.D., [facility].C.P Pulmonary and Critical Care Medicine Staff Physician [place] [facility] Pager: [phone], If no answer or between  15:00h - 7:00h: call [phone]  04/20/2014 5:27 PM

## 2014-04-20 NOTE — Plan of Care (Signed)
Problem: Discharge Progression Outcomes Goal: Discharge plan in place and appropriate Outcome: Completed/Met Date Met:  04/20/14 Goal: Hemodynamically stable Outcome: Completed/Met Date Met:  72/89/79 Goal: Complications resolved/controlled Outcome: Completed/Met Date Met:  04/20/14 Goal: Tolerating diet Outcome: Completed/Met Date Met:  04/20/14 Goal: Activity appropriate for discharge plan Outcome: Completed/Met Date Met:  04/20/14

## 2014-04-20 NOTE — Discharge Summary (Addendum)
Physician Discharge Summary  Monica Strong:[id number] DOB: [birth year] DOA: 04/15/2014  PCP: Vena Austria, MD  Admit date: 04/15/2014 Discharge date: 04/20/2014  Time spent: 50* minutes  Recommendations for Outpatient Follow-up:  1. *Follow up PCP in 2 weeks 2. Check PT/INR in 3 days,  3. Continue Lovenox and Coumadin,  4. disccontinue Lovenox once INR is therapeutic between 2 and 3  Discharge Diagnoses:  Principal Problem:   Psychosis Active Problems:   Atrial fibrillation   Syncope   Thrombocytopenia   LOC (loss of consciousness)   Faintness   PE (pulmonary embolism)   Discharge Condition: Stable  Diet recommendation: Low salt diet   Filed Weights   04/16/14 2037 04/17/14 2018 04/18/14 2026  Weight: 97.115 kg (214 lb 1.6 oz) 97.251 kg (214 lb 6.4 oz) 96.8 kg (213 lb 6.5 oz)    History of present illness:  80 year old female was brought to the hospital by EMS after patient was found to be unresponsive at the assisted living facility. Patient is a poor historian, but remembers she was about to get the phone and after that everything went blank. There was no seizure-like activity noted by the nursing staff, she was found to be slumped in the chair. Patient was unresponsive for about 10 minutes, though there was no loss of bowel or bladder control, no biting of tongue. In the ED patient was seen by neurology who recommended patient to be admitted for further evaluation including EEG.she also complained of left-sided abdominal pain,CT abdomen pelvis was done which showed interval nodules in the right middle lobe and right upper lobe suspicious for malignancy. She denies chest pain, no shortness of breath no nausea vomiting or diarrhea. She denies palpitations no fever no dysuria urgency or frequency of urination.  Patient see and examined, heart rate going to 130s 140s on exertion.  Hospital Course:  1. Pulmonary embolism- CT angiogram shows moderate size  pulmonary emboli in the right upper lobe.we'll start the patient on heparin per pharmacy consultation and Coumadin.patient will be discharged on Lovenox twice a day along with Coumadin 5 mg daily.                                      2. Lovenox to be given 6 am and 6 pm.   3. Syncope versus seizure- EEG shows mild loss continuous nonspecific slowing of the cerebral activity consistent with metabolic or toxic encephalopathy., likely patient had syncope from pulmonary embolism. Neurology has seen the patient.CT head is negative for any acute abnormality. 4. Lung mass.- CT chest shows patient has right middle and upper lobe nodules,enlarging prevascular adenopathy . I discussed in detail with family including both daughters, they'll follow up with pulmonary as outpatient for further workup.Called and discussed with Dr. Chase Caller, who will see the patient in 4 weeks in the office 5. Atrial fibrillation- Heart rate was not controlled so the dose of Cardizem was increased to60 mg 4 times a day. Patient is on Coumadin. Continue aspirin 81 mg by mouth daily.  6. Somnolence/Paranoia- Patient was taking Seroquel 75 mg po daily in September, and the dose of Seroquel was increased to 400 mg by mouth daily by her PCP, the cut down the dose to 200 mg by mouth daily in the hospital and somnolence has definitely improved. She was also seen by psychiatry, who recommended to continue the same dose, and cutdown to 100 mg by  mouth daily, if she again become somnolent. 7. Patient was seen by physical therapy who recommended home health PT. Will arrange for home health PT at the assisted living facility.  Procedures:  none  Consultations: Psychiatry   Discharge Exam: Filed Vitals:   04/20/14 1013  BP: 107/55  Pulse: 54  Temp: 98 F (36.7 C)  Resp: 18    General: appear in no acute distress Cardiovascular: S1-S2 is regular Respiratory: clear bilaterally  Discharge Instructions You were cared for by a  hospitalist during your hospital stay. If you have any questions about your discharge medications or the care you received while you were in the hospital after you are discharged, you can call the unit and asked to speak with the hospitalist on call if the hospitalist that took care of you is not available. Once you are discharged, your primary care physician will handle any further medical issues. Please note that NO REFILLS for any discharge medications will be authorized once you are discharged, as it is imperative that you return to your primary care physician (or establish a relationship with a primary care physician if you do not have one) for your aftercare needs so that they can reassess your need for medications and monitor your lab values.  Discharge Instructions    Diet - low sodium heart healthy    Complete by:  As directed      Discharge instructions    Complete by:  As directed   Check PT/INT in 3 days. Continue with Lovenox till the INR is therapeutic     Increase activity slowly    Complete by:  As directed           Current Discharge Medication List    START taking these medications   Details  enoxaparin (LOVENOX) 100 MG/ML injection Inject 1 mL (100 mg total) into the skin every 12 (twelve) hours. Qty: 20 Syringe, Refills: 0    HYDROcodone-acetaminophen (LORTAB) 5-325 MG per tablet Take 1 tablet by mouth every 6 (six) hours as needed for moderate pain. Qty: 30 tablet, Refills: 0    polyethylene glycol (MIRALAX / GLYCOLAX) packet Take 17 g by mouth daily. Qty: 14 each, Refills: 0    warfarin (COUMADIN) 5 MG tablet Take 1 tablet (5 mg total) by mouth daily. Qty: 30 tablet, Refills: 2      CONTINUE these medications which have CHANGED   Details  aspirin EC 81 MG EC tablet Take 1 tablet (81 mg total) by mouth daily. Qty: 30 tablet, Refills: 2    diltiazem (CARDIZEM) 60 MG tablet Take 1 tablet (60 mg total) by mouth every 6 (six) hours. Qty: 60 tablet, Refills: 2     QUEtiapine (SEROQUEL XR) 200 MG 24 hr tablet Take 1 tablet (200 mg total) by mouth every evening. Qty: 30 tablet, Refills: 2      CONTINUE these medications which have NOT CHANGED   Details  Multiple Vitamins-Minerals (MULTIVITAMIN PO) Take 1 tablet by mouth daily.    omeprazole (PRILOSEC) 20 MG capsule Take 20 mg by mouth daily.    senna-docusate (SENOKOT-S) 8.6-50 MG per tablet Take 1 tablet by mouth 2 (two) times daily.    pramoxine-mineral oil-zinc (TUCKS) 1-12.5 % rectal ointment Place 1 application rectally every 4 (four) hours as needed for itching or irritation.      STOP taking these medications     acetaminophen (TYLENOL) 325 MG tablet        Allergies  Allergen Reactions  .  Diphenhydramine Other (See Comments)    UNKNOWN  . Flagyl [Metronidazole] Other (See Comments)    HALLUCINATIONS  . Hydrocodone Other (See Comments)    HALLUCINATIONS  . Meperidine Hcl Nausea And Vomiting  . Penicillins Hives  . Sulfonamide Derivatives Hives   Follow-up Information    Follow up with RAMASWAMY,MURALI, MD In 4 weeks.   Specialty:  Pulmonary Disease   Contact information:   [address] [id number] [phone]        The results of significant diagnostics from this hospitalization (including imaging, microbiology, ancillary and laboratory) are listed below for reference.    Significant Diagnostic Studies: Dg Chest 2 View  04/15/2014   CLINICAL DATA:  Loss of consciousness earlier today. One day history of left-sided chest pain. Non smoker.  EXAM: CHEST  2 VIEW  COMPARISON:  Portable chest x-ray 02/16/2014. CT chest 08/22/2012, 08/22/2011, 08/31/2010, 03/10/2010.  FINDINGS: Suboptimal inspiration due to body habitus accounts for crowded bronchovascular markings, especially in the bases, and accentuates the cardiac silhouette. Taking this into account, cardiac silhouette moderately enlarged but stable. Thoracic aorta tortuous in a atherosclerotic, unchanged.  Hilar and mediastinal contours otherwise unremarkable. Vague oval-shaped nodule in the right middle lobe, unchanged from the prior CTs. Linear scar or atelectasis in the lingula. Lungs otherwise clear. Bronchovascular markings normal. Pulmonary vascularity normal. No visible pleural effusions. No pneumothorax. Generalized osseous demineralization and spondylosis throughout the thoracic spine.  IMPRESSION: 1. Linear atelectasis or scar in the lingula. No acute cardiopulmonary disease otherwise. 2. Stable oval-shaped nodule in the right middle lobe dating back to 2011, indicating benignity. 3. Stable moderate cardiomegaly without pulmonary edema.   Electronically Signed   By: Evangeline Dakin M.D.   On: 04/15/2014 10:43   Ct Head Wo Contrast  04/16/2014   CLINICAL DATA:  Syncopal episode yesterday.  EXAM: CT HEAD WITHOUT CONTRAST  TECHNIQUE: Contiguous axial images were obtained from the base of the skull through the vertex without intravenous contrast.  COMPARISON:  02/21/2014  FINDINGS: There is no evidence of acute cortical infarct, intracranial hemorrhage, mass, midline shift, or extra-axial fluid collection. Ventricles and sulci are within normal limits. No significant white matter disease is identified.  Prior bilateral cataract extraction is noted. Small amount of cerumen is noted in the right greater than left external auditory canal is. Hyperostosis frontalis is noted. Visualized paranasal sinuses and mastoid air cells are clear.  IMPRESSION: Unremarkable CT appearance of the brain for age.   Electronically Signed   By: Logan Bores   On: 04/16/2014 11:28   Ct Angio Chest Pe W/cm &/or Wo Cm  04/16/2014   CLINICAL DATA:  Acute onset of left anterior chest pain today.  EXAM: CT ANGIOGRAPHY CHEST WITH CONTRAST  TECHNIQUE: Multidetector CT imaging of the chest was performed using the standard protocol during bolus administration of intravenous contrast. Multiplanar CT image reconstructions and MIPs were  obtained to evaluate the vascular anatomy.  CONTRAST:  84mL OMNIPAQUE IOHEXOL 350 MG/ML SOLN  COMPARISON:  08/22/2012  FINDINGS: There are filling defects within the right upper lobe pulmonary arteries, anterior and posterior segments compatible with moderate-sized pulmonary emboli. No other filling defects are noted.  Heart is enlarged. There is a small pericardial effusion, including fluid within the pericardial recess anterior to the aortic arch. Adjacent to this pericardial recesses an enlarged prevascular lymph node with a short axis diameter of 2.3 cm compared with 8 mm previously.  Coronary artery calcifications are noted. There are trace bilateral pleural  effusions. Minimal dependent atelectasis in both lower lungs posteriorly.  Right middle lobe nodule on image 40 measures 10 mm. This previously measured 10 mm. Small right and 2 scratch has small right upper lobe nodule noted peripherally on image 31 is stable as well.  Chest wall soft tissues are unremarkable. Moderate-sized hiatal hernia containing fat. Multiple hypodensities scattered throughout the liver most likely reflects cysts. High density material is noted within the gallbladder, likely vicarious excretion of contrast related to prior abdominal CT.  Review of the MIP images confirms the above findings.  IMPRESSION: Moderate-sized pulmonary emboli in the right upper lobe. No evidence of right heart strain.  Small pericardial effusion, trace pleural effusions.  Cardiomegaly.  Enlarging prevascular adenopathy since prior chest CT. Cannot exclude metastatic adenopathy or lymphoproliferative disorder.  Right upper lobe and right middle lobe pulmonary nodules are stable since 2014.  These results were called by telephone at the time of interpretation on 04/16/2014 at 11:27 am to patient's nurse Caryl Pina, who verbally acknowledged these results.   Electronically Signed   By: Rolm Baptise M.D.   On: 04/16/2014 11:29   Ct Abdomen Pelvis W  Contrast  04/15/2014   CLINICAL DATA:  Intermittent left upper quadrant abdominal pain for several days.  EXAM: CT ABDOMEN AND PELVIS WITH CONTRAST  TECHNIQUE: Multidetector CT imaging of the abdomen and pelvis was performed using the standard protocol following bolus administration of intravenous contrast.  CONTRAST:  153mL OMNIPAQUE IOHEXOL 300 MG/ML  SOLN  COMPARISON:  Abdomen radiographs dated 03/09/2013. Abdomen and pelvis CT dated 04/11/2012.  FINDINGS: Multiple liver cysts are again demonstrated. Bilateral parapelvic renal cysts. No hydronephrosis. Unremarkable spleen, pancreas, gallbladder, adrenal glands, ureters and urinary bladder. Surgically absent uterus. No adnexal masses enlarged lymph nodes.  Interval small amount of linear atelectasis or scarring at the right lung base. Interval oval nodule in the right middle lobe measuring 1.3 cm on image 8. There is also a 4 mm semi-solid nodule in the right upper lobe on image number 2 and a 4 mm nodule in the anterior medial right middle lobe on image 8. Decreased amount of pericardial with a small amount of fluid remaining, measuring 7 mm in maximum thickness.  Stool throughout the majority of the colon. No gastric or small bowel abnormalities. Moderate sized hiatal hernia containing fat without herniated stomach. Bilateral pelvic phleboliths.  IMPRESSION: 1. Interval nodules in the right middle lobe and right upper lobe suspicious for malignancy or a metastatic process, as described above. These could be further characterized with PET-CT if clinically indicated. 2. Minimal pericardial effusion, improved.   Electronically Signed   By: Enrique Sack M.D.   On: 04/15/2014 13:31    Microbiology: Recent Results (from the past 240 hour(s))  MRSA PCR Screening     Status: None   Collection Time: 04/16/14  5:50 AM  Result Value Ref Range Status   MRSA by PCR NEGATIVE NEGATIVE Final    Comment:        The GeneXpert MRSA Assay (FDA approved for NASAL  specimens only), is one component of a comprehensive MRSA colonization surveillance program. It is not intended to diagnose MRSA infection nor to guide or monitor treatment for MRSA infections.      Labs: Basic Metabolic Panel:  Recent Labs Lab 04/15/14 0950 04/15/14 1649 04/16/14 0437 04/19/14 0605 04/20/14 0533  NA 146  --  143  --  144  K 4.1  --  4.0  --  4.1  CL 108  --  106  --  107  CO2 27  --  24  --  25  GLUCOSE 104*  --  85  --  97  BUN 17  --  15  --  19  CREATININE 0.88 0.91 0.80 0.80 0.84  CALCIUM 9.4  --  8.9  --  9.0   Liver Function Tests:  Recent Labs Lab 04/15/14 0950 04/16/14 0437  AST 15 14  ALT 10 10  ALKPHOS 76 72  BILITOT 0.4 0.5  PROT 6.2 5.7*  ALBUMIN 3.4* 3.3*   No results for input(s): LIPASE, AMYLASE in the last 168 hours. No results for input(s): AMMONIA in the last 168 hours. CBC:  Recent Labs Lab 04/15/14 0950 04/15/14 1649 04/16/14 0437 04/19/14 0605 04/20/14 0533  WBC 4.3 4.1 4.5 4.3 4.1  NEUTROABS 2.5  --   --   --   --   HGB 13.7 12.5 12.9 12.5 12.6  HCT 42.1 38.7 40.0 39.1 40.2  MCV 95.7 95.6 95.0 97.5 97.8  PLT 145* 159 177 168 161   Cardiac Enzymes:  Recent Labs Lab 04/15/14 0950  TROPONINI <0.30   BNP: BNP (last 3 results) No results for input(s): PROBNP in the last 8760 hours. CBG:  Recent Labs Lab 04/15/14 1113 04/20/14 0817  GLUCAP 84 105*       Signed:  Kathlen Sakurai S . [facility] 04/20/2014, 1:44 PM  .

## 2014-04-20 NOTE — Care Management Note (Signed)
CARE MANAGEMENT NOTE 04/20/2014  Patient:  JAYLAA, GALLION   Account Number:  [id number]  Date Initiated:  04/19/2014  Documentation initiated by:  Tanaysha Alkins  Subjective/Objective Assessment:   CM following for progression and d/c planning.     Action/Plan:   04/19/2014 Ongoing efforts to complete Lovenox to Coumadin bridge and return pt to ALF.  04/20/2014 Spoke with Sonia Baller RN at [place] and she reqest order and face to face for HHPT, they will arrange.   Anticipated DC Date:  04/20/2014   Anticipated DC Plan:  ASSISTED LIVING / REST HOME         Choice offered to / List presented to:          [facility] arranged  HH-2 PT      Status of service:  Completed, signed off Medicare Important Message given?  YES (If response is "NO", the following Medicare IM given date fields will be blank) Date Medicare IM given:  04/19/2014 Medicare IM given by:  Kitana Gage Date Additional Medicare IM given:   Additional Medicare IM given by:    Discharge Disposition:    Per UR Regulation:    If discussed at Long Length of Stay Meetings, dates discussed:    Comments:  04/20/2014 Orders for HHPT and face to face printed and will be sent to ALF with pt information per instruction from Sonia Baller, nurse at that facility.  CRoyal RN MPH, case manager, [phone]  04/19/2014 CSW following for return to ALF when INR in theraputic range.  CRoyal RN MPH, case manager, [phone]

## 2014-04-21 NOTE — Telephone Encounter (Signed)
ATC pt.  Mobile phone- unable to leave VM Home phone- phone has been disconnected. WCB.

## 2014-04-23 ENCOUNTER — Emergency Department (HOSPITAL_COMMUNITY)
Admission: EM | Admit: 2014-04-23 | Discharge: 2014-04-23 | Disposition: A | Discharge status: Home / Self Care | Payer: Medicare Other | Attending: Emergency Medicine | Admitting: Emergency Medicine

## 2014-04-23 ENCOUNTER — Encounter (HOSPITAL_COMMUNITY): Payer: Self-pay

## 2014-04-23 ENCOUNTER — Emergency Department (HOSPITAL_COMMUNITY): Payer: Medicare Other

## 2014-04-23 DIAGNOSIS — R1011 Right upper quadrant pain: Secondary | ICD-10-CM | POA: Diagnosis not present

## 2014-04-23 DIAGNOSIS — Z960 Presence of urogenital implants: Secondary | ICD-10-CM | POA: Insufficient documentation

## 2014-04-23 DIAGNOSIS — F22 Delusional disorders: Secondary | ICD-10-CM | POA: Insufficient documentation

## 2014-04-23 DIAGNOSIS — R011 Cardiac murmur, unspecified: Secondary | ICD-10-CM | POA: Insufficient documentation

## 2014-04-23 DIAGNOSIS — Z96653 Presence of artificial knee joint, bilateral: Secondary | ICD-10-CM | POA: Insufficient documentation

## 2014-04-23 DIAGNOSIS — Z7982 Long term (current) use of aspirin: Secondary | ICD-10-CM | POA: Insufficient documentation

## 2014-04-23 DIAGNOSIS — R1010 Upper abdominal pain, unspecified: Secondary | ICD-10-CM

## 2014-04-23 DIAGNOSIS — Z9089 Acquired absence of other organs: Secondary | ICD-10-CM | POA: Diagnosis not present

## 2014-04-23 DIAGNOSIS — Z79899 Other long term (current) drug therapy: Secondary | ICD-10-CM | POA: Insufficient documentation

## 2014-04-23 DIAGNOSIS — I10 Essential (primary) hypertension: Secondary | ICD-10-CM | POA: Diagnosis not present

## 2014-04-23 DIAGNOSIS — Z7901 Long term (current) use of anticoagulants: Secondary | ICD-10-CM | POA: Diagnosis not present

## 2014-04-23 DIAGNOSIS — E669 Obesity, unspecified: Secondary | ICD-10-CM | POA: Insufficient documentation

## 2014-04-23 DIAGNOSIS — Z87442 Personal history of urinary calculi: Secondary | ICD-10-CM | POA: Diagnosis not present

## 2014-04-23 DIAGNOSIS — M1990 Unspecified osteoarthritis, unspecified site: Secondary | ICD-10-CM | POA: Diagnosis not present

## 2014-04-23 DIAGNOSIS — Z8619 Personal history of other infectious and parasitic diseases: Secondary | ICD-10-CM | POA: Diagnosis not present

## 2014-04-23 DIAGNOSIS — Z8709 Personal history of other diseases of the respiratory system: Secondary | ICD-10-CM | POA: Insufficient documentation

## 2014-04-23 DIAGNOSIS — K219 Gastro-esophageal reflux disease without esophagitis: Secondary | ICD-10-CM | POA: Diagnosis not present

## 2014-04-23 DIAGNOSIS — R0789 Other chest pain: Secondary | ICD-10-CM | POA: Diagnosis not present

## 2014-04-23 DIAGNOSIS — R1032 Left lower quadrant pain: Secondary | ICD-10-CM | POA: Diagnosis present

## 2014-04-23 DIAGNOSIS — Z8669 Personal history of other diseases of the nervous system and sense organs: Secondary | ICD-10-CM | POA: Diagnosis not present

## 2014-04-23 DIAGNOSIS — R109 Unspecified abdominal pain: Secondary | ICD-10-CM

## 2014-04-23 DIAGNOSIS — Z880 Allergy status to penicillin: Secondary | ICD-10-CM | POA: Diagnosis not present

## 2014-04-23 LAB — COMPREHENSIVE METABOLIC PANEL
ALBUMIN: 3.5 g/dL (ref 3.5–5.2)
ALT: 34 U/L (ref 0–35)
ANION GAP: 10 (ref 5–15)
AST: 35 U/L (ref 0–37)
Alkaline Phosphatase: 80 U/L (ref 39–117)
BUN: 15 mg/dL (ref 6–23)
CHLORIDE: 109 meq/L (ref 96–112)
CO2: 25 mEq/L (ref 19–32)
CREATININE: 0.85 mg/dL (ref 0.50–1.10)
Calcium: 9.5 mg/dL (ref 8.4–10.5)
GFR calc Af Amer: 73 mL/min — ABNORMAL LOW (ref >90)
GFR, EST NON AFRICAN AMERICAN: 63 mL/min — AB (ref >90)
Glucose, Bld: 90 mg/dL (ref 70–99)
Potassium: 4 mEq/L (ref 3.7–5.3)
Sodium: 144 mEq/L (ref 137–147)
Total Protein: 6.3 g/dL (ref 6.0–8.3)

## 2014-04-23 LAB — URINALYSIS, ROUTINE W REFLEX MICROSCOPIC
BILIRUBIN URINE: NEGATIVE
Glucose, UA: NEGATIVE mg/dL
Hgb urine dipstick: NEGATIVE
KETONES UR: NEGATIVE mg/dL
Nitrite: NEGATIVE
Protein, ur: NEGATIVE mg/dL
Specific Gravity, Urine: 1.022 (ref 1.005–1.030)
UROBILINOGEN UA: 0.2 mg/dL (ref 0.0–1.0)
pH: 5.5 (ref 5.0–8.0)

## 2014-04-23 LAB — CBC WITH DIFFERENTIAL/PLATELET
BASOS PCT: 1 % (ref 0–1)
Basophils Absolute: 0 10*3/uL (ref 0.0–0.1)
Eosinophils Absolute: 0.1 10*3/uL (ref 0.0–0.7)
Eosinophils Relative: 3 % (ref 0–5)
HCT: 42.6 % (ref 36.0–46.0)
HEMOGLOBIN: 13.7 g/dL (ref 12.0–15.0)
Lymphocytes Relative: 35 % (ref 12–46)
Lymphs Abs: 1.1 10*3/uL (ref 0.7–4.0)
MCH: 30.7 pg (ref 26.0–34.0)
MCHC: 32.2 g/dL (ref 30.0–36.0)
MCV: 95.5 fL (ref 78.0–100.0)
MONO ABS: 0.3 10*3/uL (ref 0.1–1.0)
MONOS PCT: 11 % (ref 3–12)
NEUTROS ABS: 1.6 10*3/uL — AB (ref 1.7–7.7)
Neutrophils Relative %: 50 % (ref 43–77)
Platelets: 166 10*3/uL (ref 150–400)
RBC: 4.46 MIL/uL (ref 3.87–5.11)
RDW: 14.1 % (ref 11.5–15.5)
WBC: 3.2 10*3/uL — ABNORMAL LOW (ref 4.0–10.5)

## 2014-04-23 LAB — PROTIME-INR
INR: 1.78 — AB (ref 0.00–1.49)
Prothrombin Time: 20.9 seconds — ABNORMAL HIGH (ref 11.6–15.2)

## 2014-04-23 LAB — URINE MICROSCOPIC-ADD ON

## 2014-04-23 LAB — LIPASE, BLOOD: LIPASE: 41 U/L (ref 11–59)

## 2014-04-23 LAB — I-STAT CG4 LACTIC ACID, ED: Lactic Acid, Venous: 2.21 mmol/L — ABNORMAL HIGH (ref 0.5–2.2)

## 2014-04-23 LAB — I-STAT TROPONIN, ED
Troponin i, poc: 0 ng/mL (ref 0.00–0.08)
Troponin i, poc: 0 ng/mL (ref 0.00–0.08)

## 2014-04-23 MED ORDER — IOHEXOL 300 MG/ML  SOLN
25.0000 mL | Freq: Once | INTRAMUSCULAR | Status: AC | PRN
  Administered 2014-04-23: 25 mL via ORAL

## 2014-04-23 MED ORDER — ENOXAPARIN SODIUM 100 MG/ML ~~LOC~~ SOLN
100.0000 mg | Freq: Once | SUBCUTANEOUS | Status: AC
  Administered 2014-04-23: 100 mg via SUBCUTANEOUS
  Filled 2014-04-23: qty 1

## 2014-04-23 MED ORDER — IOHEXOL 300 MG/ML  SOLN
100.0000 mL | Freq: Once | INTRAMUSCULAR | Status: AC | PRN
  Administered 2014-04-23: 100 mL via INTRAVENOUS

## 2014-04-23 NOTE — ED Notes (Signed)
Per [facility], pt from [facility] assisted [facility] for unk reason. Initially staff called daughter because she was having RUQ pain then told EMS she was having left chest pain and now denies any pain at all. 20g to [facility], afib on monitor with hx of same.

## 2014-04-23 NOTE — ED Notes (Signed)
Dr. Marigene Ehlers at the bedside.

## 2014-04-23 NOTE — ED Notes (Signed)
Phlebotomy at bedside.

## 2014-04-23 NOTE — ED Notes (Signed)
Patient placed on the bedpan and left to have some privacy. Made aware to call RN when finished.

## 2014-04-23 NOTE — Discharge Instructions (Signed)
Abdominal Pain °Many things can cause abdominal pain. Usually, abdominal pain is not caused by a disease and will improve without treatment. It can often be observed and treated at home. Your health care provider will do a physical exam and possibly order blood tests and X-rays to help determine the seriousness of your pain. However, in many cases, more time must pass before a clear cause of the pain can be found. Before that point, your health care provider may not know if you need more testing or further treatment. °HOME CARE INSTRUCTIONS  °Monitor your abdominal pain for any changes. The following actions may help to alleviate any discomfort you are experiencing: °· Only take over-the-counter or prescription medicines as directed by your health care provider. °· Do not take laxatives unless directed to do so by your health care provider. °· Try a clear liquid diet (broth, tea, or water) as directed by your health care provider. Slowly move to a bland diet as tolerated. °SEEK MEDICAL CARE IF: °· You have unexplained abdominal pain. °· You have abdominal pain associated with nausea or diarrhea. °· You have pain when you urinate or have a bowel movement. °· You experience abdominal pain that wakes you in the night. °· You have abdominal pain that is worsened or improved by eating food. °· You have abdominal pain that is worsened with eating fatty foods. °· You have a fever. °SEEK IMMEDIATE MEDICAL CARE IF:  °· Your pain does not go away within 2 hours. °· You keep throwing up (vomiting). °· Your pain is felt only in portions of the abdomen, such as the right side or the left lower portion of the abdomen. °· You pass bloody or black tarry stools. °MAKE SURE YOU: °· Understand these instructions.   °· Will watch your condition.   °· Will get help right away if you are not doing well or get worse.   °Document Released: 03/07/2005 Document Revised: 06/02/2013 Document Reviewed: 02/04/2013 °ExitCare® Patient Information  ©2015 [employer]. This information is not intended to replace advice given to you by your health care provider. Make sure you discuss any questions you have with your health care provider. ° °Chest Pain (Nonspecific) °It is often hard to give a specific diagnosis for the cause of chest pain. There is always a chance that your pain could be related to something serious, such as a heart attack or a blood clot in the lungs. You need to follow up with your health care provider for further evaluation. °CAUSES  °· Heartburn. °· Pneumonia or bronchitis. °· Anxiety or stress. °· Inflammation around your heart (pericarditis) or lung (pleuritis or pleurisy). °· A blood clot in the lung. °· A collapsed lung (pneumothorax). It can develop suddenly on its own (spontaneous pneumothorax) or from trauma to the chest. °· Shingles infection (herpes zoster virus). °The chest wall is composed of bones, muscles, and cartilage. Any of these can be the source of the pain. °· The bones can be bruised by injury. °· The muscles or cartilage can be strained by coughing or overwork. °· The cartilage can be affected by inflammation and become sore (costochondritis). °DIAGNOSIS  °Lab tests or other studies may be needed to find the cause of your pain. Your health care provider may have you take a test called an ambulatory electrocardiogram (ECG). An ECG records your heartbeat patterns over a 24-hour period. You may also have other tests, such as: °· Transthoracic echocardiogram (TTE). During echocardiography, sound waves are used to evaluate how blood   flows through your heart. °· Transesophageal echocardiogram (TEE). °· Cardiac monitoring. This allows your health care provider to monitor your heart rate and rhythm in real time. °· Holter monitor. This is a portable device that records your heartbeat and can help diagnose heart arrhythmias. It allows your health care provider to track your heart activity for several days, if needed. °· Stress  tests by exercise or by giving medicine that makes the heart beat faster. °TREATMENT  °· Treatment depends on what may be causing your chest pain. Treatment may include: °¨ Acid blockers for heartburn. °¨ Anti-inflammatory medicine. °¨ Pain medicine for inflammatory conditions. °¨ Antibiotics if an infection is present. °· You may be advised to change lifestyle habits. This includes stopping smoking and avoiding alcohol, caffeine, and chocolate. °· You may be advised to keep your head raised (elevated) when sleeping. This reduces the chance of acid going backward from your stomach into your esophagus. °Most of the time, nonspecific chest pain will improve within 2-3 days with rest and mild pain medicine.  °HOME CARE INSTRUCTIONS  °· If antibiotics were prescribed, take them as directed. Finish them even if you start to feel better. °· For the next few days, avoid physical activities that bring on chest pain. Continue physical activities as directed. °· Do not use any tobacco products, including cigarettes, chewing tobacco, or electronic cigarettes. °· Avoid drinking alcohol. °· Only take medicine as directed by your health care provider. °· Follow your health care provider's suggestions for further testing if your chest pain does not go away. °· Keep any follow-up appointments you made. If you do not go to an appointment, you could develop lasting (chronic) problems with pain. If there is any problem keeping an appointment, call to reschedule. °SEEK MEDICAL CARE IF:  °· Your chest pain does not go away, even after treatment. °· You have a rash with blisters on your chest. °· You have a fever. °SEEK IMMEDIATE MEDICAL CARE IF:  °· You have increased chest pain or pain that spreads to your arm, neck, jaw, back, or abdomen. °· You have shortness of breath. °· You have an increasing cough, or you cough up blood. °· You have severe back or abdominal pain. °· You feel nauseous or vomit. °· You have severe weakness. °· You  faint. °· You have chills. °This is an emergency. Do not wait to see if the pain will go away. Get medical help at once. Call your local emergency services (911 in [place] Do not drive yourself to the hospital. °MAKE SURE YOU:  °· Understand these instructions. °· Will watch your condition. °· Will get help right away if you are not doing well or get worse. °Document Released: 03/07/2005 Document Revised: 06/02/2013 Document Reviewed: 01/01/2008 °ExitCare® Patient Information ©2015 [employer]. This information is not intended to replace advice given to you by your health care provider. Make sure you discuss any questions you have with your health care provider. ° °

## 2014-04-23 NOTE — Telephone Encounter (Signed)
ATC pt on home and cell number- both unable to leave VM. Will try and call back.

## 2014-04-23 NOTE — ED Provider Notes (Signed)
CSN: [id number]     Arrival date & time 04/23/14  1132 History   First MD Initiated Contact with Patient 04/23/14 1134     Chief Complaint  Patient presents with  . Abdominal Pain   Patient is a 80 y.o. female presenting with abdominal pain. The history is provided by the patient and the EMS personnel.  Abdominal Pain Pain location:  LLQ and RUQ Pain quality: aching   Pain severity:  No pain (pain was transient and resolved prior to EMS arrival) Onset quality:  Sudden Timing:  Intermittent Progression:  Resolved Chronicity:  New Context: not retching and not trauma   Relieved by:  Nothing Worsened by:  Palpation Ineffective treatments:  None tried Associated symptoms: chest pain   Associated symptoms: no fever, no nausea, no shortness of breath and no vomiting   Chest pain:    Quality:  Unable to specify   Severity:  Mild   Progression:  Resolved   Chronicity:  New Risk factors: being elderly and obesity     Past Medical History  Diagnosis Date  . Renal stones left  . GERD (gastroesophageal reflux disease)   . H/O hiatal hernia   . Pulmonary nodules BENIGN--  MONITORED BY PCP DR READE--  ASYMPTOMATIC     LAST CHEST CT 08-17-2011  . Heart murmur MILD -- ASYMPTOMATIC  . Urge urinary incontinence   . Sinus drainage   . Neuromuscular disorder     rt hand numbness  . Dysuria     has urethral bump  . Ureteral stent retained   . Arthritis   . OSA (obstructive sleep apnea)     not using cpap  . Paranoia   . Obesity   . Chest pain     a. Normal stress test 08/2008 and CTA neg for PE at that time.  Jola [facility] ear dysfunction     a. Prior h/o vertigo.  Marland Kitchen History of infection due to ESBL Escherichia coli 02/23/2014  . Hypertension    Past Surgical History  Procedure Laterality Date  . Total knee arthroplasty  10-17-2009  DR ALUISIO    LEFT  . Right total knee arthroplasty  04-25-2009  . Tonsillectomy    . Cardiovascular stress test  03-13-2007  dr Tressia Miners turner    NORMAL  LV SIZE SYSTOLIC FUCTION/ NO ISCHEMIA/ EF 85%  . Transthoracic echocardiogram  08-18-2008    NORMAL LVSF/ EF 55-60%/ MILD MITRAL REGURG .  Marland Kitchen Removal breast cyst, benign  1960'S  . Vaginal hysterectomy  1970  . Cataract extraction w/ intraocular lens  implant, bilateral    . Blepharoplasty      BILATERAL  . Cystoscopy with retrograde pyelogram, ureteroscopy and stent placement  05/14/2012    Procedure: [place], URETEROSCOPY AND STENT PLACEMENT;  Surgeon: Alexis Frock, MD;  Location: [facility];  Service: Urology;  Laterality: Left;  1ST STAGE LEFT URETEROSCOPY, LEFT RETROGRADE, DIGITAL URETEROSCOPY,  LEFT STONE REMOVAL WITH ESCAPE BASKET,  STENT PLACEMENt   . Holmium laser application  59/02/3569    Procedure: HOLMIUM LASER APPLICATION;  Surgeon: Alexis Frock, MD;  Location: [facility];  Service: Urology;  Laterality: Left;  . Holmium laser application  06/17/7937    Procedure: HOLMIUM LASER APPLICATION;  Surgeon: Alexis Frock, MD;  Location: [facility];  Service: Urology;  Laterality: Left;  . Cystoscopy/retrograde/ureteroscopy/stone extraction with basket  06/13/2012    Procedure: CYSTOSCOPY/RETROGRADE/URETEROSCOPY/STONE EXTRACTION WITH BASKET;  Surgeon: Alexis Frock, MD;  Location: [facility];  Service: Urology;  Laterality: Left;  . Cystoscopy w/ ureteral stent placement  06/13/2012    Procedure: CYSTOSCOPY WITH STENT REPLACEMENT;  Surgeon: Alexis Frock, MD;  Location: [facility];  Service: Urology;  Laterality: Left;  . Appendectomy      PT STATES PER XRAY SMALL AMOUNT OF APPENDIX LEFT  . Cystoscopy w/ ureteral stent removal  07/11/2012    Procedure: CYSTOSCOPY WITH STENT REMOVAL;  Surgeon: Alexis Frock, MD;  Location: [facility];  Service: Urology;  Laterality: Left;  . Cystoscopy w/ retrogrades  07/11/2012    Procedure: CYSTOSCOPY WITH RETROGRADE PYELOGRAM;   Surgeon: Alexis Frock, MD;  Location: [facility];  Service: Urology;  Laterality: Left;  left third stage ureteroscopystone manipulation  . Ureteroscopy  07/11/2012    Procedure: URETEROSCOPY;  Surgeon: Alexis Frock, MD;  Location: [facility];  Service: Urology;  Laterality: Left;   Family History  Problem Relation Age of Onset  . Other      Pt unaware due to psych condition   History  Substance Use Topics  . Smoking status: Never Smoker   . Smokeless tobacco: Never Used  . Alcohol Use: No   OB History    No data available     Review of Systems  Constitutional: Negative for fever.  Respiratory: Negative for shortness of breath.   Cardiovascular: Positive for chest pain.  Gastrointestinal: Positive for abdominal pain. Negative for nausea and vomiting.  Neurological: Negative for syncope.  All other systems reviewed and are negative.     Allergies  Diphenhydramine; Flagyl; Hydrocodone; Meperidine hcl; Penicillins; and Sulfonamide derivatives  Home Medications   Prior to Admission medications   Medication Sig Start Date End Date Taking? Authorizing Provider  aspirin EC 81 MG EC tablet Take 1 tablet (81 mg total) by mouth daily. 04/20/14   Oswald Hillock, MD  diltiazem (CARDIZEM) 60 MG tablet Take 1 tablet (60 mg total) by mouth every 6 (six) hours. 04/20/14   Oswald Hillock, MD  enoxaparin (LOVENOX) 100 MG/ML injection Inject 1 mL (100 mg total) into the skin every 12 (twelve) hours. 04/21/14   Oswald Hillock, MD  HYDROcodone-acetaminophen (LORTAB) 5-325 MG per tablet Take 1 tablet by mouth every 6 (six) hours as needed for moderate pain. 04/20/14   Oswald Hillock, MD  Multiple Vitamins-Minerals (MULTIVITAMIN PO) Take 1 tablet by mouth daily.    Historical Provider, MD  omeprazole (PRILOSEC) 20 MG capsule Take 20 mg by mouth daily.    Historical Provider, MD  polyethylene glycol (MIRALAX / GLYCOLAX) packet Take 17 g by mouth daily. 04/20/14   Oswald Hillock, MD  pramoxine-mineral oil-zinc (TUCKS) 1-12.5 % rectal ointment Place 1 application rectally every 4 (four) hours as needed for itching or irritation.    Historical Provider, MD  QUEtiapine (SEROQUEL XR) 200 MG 24 hr tablet Take 1 tablet (200 mg total) by mouth every evening. 04/20/14   Oswald Hillock, MD  senna-docusate (SENOKOT-S) 8.6-50 MG per tablet Take 1 tablet by mouth 2 (two) times daily.    Historical Provider, MD  warfarin (COUMADIN) 5 MG tablet Take 1 tablet (5 mg total) by mouth daily. 04/20/14   Oswald Hillock, MD   BP 135/81 mmHg  Pulse 74  Temp(Src) 97.4 F (36.3 C) (Rectal)  Resp 14  SpO2 96%   Physical Exam  Constitutional: She is oriented to person, place, and time. She appears well-developed  and well-nourished. She is cooperative. No distress.  HENT:  Head: Normocephalic and atraumatic.  Right Ear: External ear normal.  Left Ear: External ear normal.  Neck: Normal range of motion and phonation normal.  Cardiovascular: Normal rate and regular rhythm.   Pulmonary/Chest: Effort normal and breath sounds normal. No respiratory distress. She has no wheezes. She has no rales.  Abdominal: Soft. Normal appearance and bowel sounds are normal. She exhibits no distension. There is tenderness. There is rebound. There is no rigidity and no guarding.    Neurological: She is alert and oriented to person, place, and time.  Speech is clear, face symmetric, moving all extremities spontaneously  Skin: Skin is warm and dry. No rash noted. She is not diaphoretic.  Psychiatric: Her speech is normal.  Vitals reviewed.   ED Course  Procedures   Labs Review  Results for orders placed or performed during the hospital encounter of 04/23/14  CBC with Differential  Result Value Ref Range   WBC 3.2 (L) 4.0 - 10.5 K/uL   RBC 4.46 3.87 - 5.11 MIL/uL   Hemoglobin 13.7 12.0 - 15.0 g/dL   HCT 42.6 36.0 - 46.0 %   MCV 95.5 78.0 - 100.0 fL   MCH 30.7 26.0 - 34.0 pg   MCHC 32.2 30.0 -  36.0 g/dL   RDW 14.1 11.5 - 15.5 %   Platelets 166 150 - 400 K/uL   Neutrophils Relative % 50 43 - 77 %   Neutro Abs 1.6 (L) 1.7 - 7.7 K/uL   Lymphocytes Relative 35 12 - 46 %   Lymphs Abs 1.1 0.7 - 4.0 K/uL   Monocytes Relative 11 3 - 12 %   Monocytes Absolute 0.3 0.1 - 1.0 K/uL   Eosinophils Relative 3 0 - 5 %   Eosinophils Absolute 0.1 0.0 - 0.7 K/uL   Basophils Relative 1 0 - 1 %   Basophils Absolute 0.0 0.0 - 0.1 K/uL  Comprehensive metabolic panel  Result Value Ref Range   Sodium 144 137 - 147 mEq/L   Potassium 4.0 3.7 - 5.3 mEq/L   Chloride 109 96 - 112 mEq/L   CO2 25 19 - 32 mEq/L   Glucose, Bld 90 70 - 99 mg/dL   BUN 15 6 - 23 mg/dL   Creatinine, Ser 0.85 0.50 - 1.10 mg/dL   Calcium 9.5 8.4 - 10.5 mg/dL   Total Protein 6.3 6.0 - 8.3 g/dL   Albumin 3.5 3.5 - 5.2 g/dL   AST 35 0 - 37 U/L   ALT 34 0 - 35 U/L   Alkaline Phosphatase 80 39 - 117 U/L   Total Bilirubin <0.2 (L) 0.3 - 1.2 mg/dL   GFR calc non Af Amer 63 (L) >90 mL/min   GFR calc Af Amer 73 (L) >90 mL/min   Anion gap 10 5 - 15  Lipase, blood  Result Value Ref Range   Lipase 41 11 - 59 U/L  Urinalysis, Routine w reflex microscopic  Result Value Ref Range   Color, Urine YELLOW YELLOW   APPearance HAZY (A) CLEAR   Specific Gravity, Urine 1.022 1.005 - 1.030   pH 5.5 5.0 - 8.0   Glucose, UA NEGATIVE NEGATIVE mg/dL   Hgb urine dipstick NEGATIVE NEGATIVE   Bilirubin Urine NEGATIVE NEGATIVE   Ketones, ur NEGATIVE NEGATIVE mg/dL   Protein, ur NEGATIVE NEGATIVE mg/dL   Urobilinogen, UA 0.2 0.0 - 1.0 mg/dL   Nitrite NEGATIVE NEGATIVE   Leukocytes, UA MODERATE (A) NEGATIVE  Urine microscopic-add on  Result Value Ref Range   Squamous Epithelial / LPF MANY (A) RARE   WBC, UA 7-10 <3 WBC/hpf   Bacteria, UA MANY (A) RARE  I-stat troponin, ED  Result Value Ref Range   Troponin i, poc 0.00 0.00 - 0.08 ng/mL   Comment 3          I-Stat CG4 Lactic Acid, ED  Result Value Ref Range   Lactic Acid, Venous 2.21 (H)  0.5 - 2.2 mmol/L  I-stat troponin, ED  Result Value Ref Range   Troponin i, poc 0.00 0.00 - 0.08 ng/mL   Comment 3            Imaging Review Ct Abdomen Pelvis W Contrast  04/23/2014   CLINICAL DATA:  Abdominal pain  EXAM: CT ABDOMEN AND PELVIS WITH CONTRAST  TECHNIQUE: Multidetector CT imaging of the abdomen and pelvis was performed using the standard protocol following bolus administration of intravenous contrast. Oral contrast was also administered.  CONTRAST:  154mL OMNIPAQUE IOHEXOL 300 MG/ML  SOLN  COMPARISON:  April 15, 2014  FINDINGS: There is cardiomegaly. There is calcification in the mitral valve. There is a small pericardial effusion. There is again noted a 1.3 x 1.1 cm nodular lesion in the lateral segment of the right middle lobe, best seen on axial slice 8 series 3. There is a 3 mm nodular lesion in the lateral segment right middle lobe on axial slice 3 series 3. There is a stable nodular opacity in the medial segment right middle lobe on axial slice 6 series 3 measuring 4 mm.  Multiple cysts throughout the liver are again noted. The largest individual cyst is located in the lateral segment of the left lobe of the liver measuring 4.5 x 3.9 cm. The next largest cyst is in the posterior segment right lobe measuring 3.7 x 3.1 cm. No non cystic liver lesions are identified. The gallbladder wall is not thickened. There is no biliary duct dilatation.  Spleen, pancreas, and right adrenal appear normal. There is a probable adenoma in the left adrenal measuring 2.1 x 1.0 cm.  There are parapelvic cysts throughout both kidneys. The largest parapelvic cyst is on the left measuring 3.2 by 3.2 cm. The largest parapelvic cyst on the right measures 2.3 x 2.2 cm. No non cystic masses are identified in the kidney. There is no hydronephrosis. There is no renal or ureteral calculus on either side.  In the pelvis, the urinary bladder is midline with normal wall thickness. There is no pelvic mass or fluid.  Uterus is absent. Appendix is absent. There are scattered sigmoid diverticula without diverticulitis.  There is no bowel obstruction. No free air or portal venous air. There is no appreciable ascites, abscess, or adenopathy in the abdomen or pelvis. The aorta is tortuous but does not show aneurysmal dilatation. There is a minimal ventral hernia containing only fat. There is degenerative change in the thoracic and lumbar spine regions. There are no blastic or lytic bone lesions.  IMPRESSION: Nodular lesions in the right lung base again noted, largest measuring 1.3 x 1.1 cm. It may be prudent to consider complete chest CT to assess for further nodular lesions elsewhere in the chest.  Small pericardial effusion. Cardiomegaly. Mitral valve calcification present.  There is no abscess or mesenteric thickening. No bowel obstruction. Uterus and appendix are absent. No hydronephrosis. No renal or ureteral calculi.  Multiple liver cysts again noted. There is a minimal ventral hernia containing only fat.  Electronically Signed   By: Lowella Grip M.D.   On: 04/23/2014 14:44   Dg Chest Portable 1 View  04/23/2014   CLINICAL DATA:  Pain in the left ribs under breast.  EXAM: PORTABLE CHEST - 1 VIEW  COMPARISON:  CT of the chest on 04/16/2014  FINDINGS: The heart is enlarged. Right paratracheal density represents prominent vessels by recent CT exam. AP window region adenopathy is better seen on CT exam. Right middle lobe nodule is again noted.  There are no new consolidations. There is minimal left lower lobe atelectasis, increased. No pleural effusions. No pneumothorax. Visualized osseous structures have a normal appearance.  IMPRESSION: 1. Cardiomegaly. 2. Stable appearance of right middle lobe nodule. 3. Increased left lower lobe atelectasis.   Electronically Signed   By: Shon Hale M.D.   On: 04/23/2014 13:20     EKG Interpretation   Date/Time:  Friday April 23 2014 11:41:22 EST Ventricular Rate:  80 PR  Interval:    QRS Duration: 95 QT Interval:  396 QTC Calculation: 457 R Axis:   49 Text Interpretation:  Atrial fibrillation Repol abnrm suggests ischemia,  anterolateral Similar to prior Confirmed by Mingo Amber  MD, BLAIR ([id number]) on  04/23/2014 11:48:53 AM      MDM   Final diagnoses:  Abdominal pain  Upper abdominal pain    80 y.o. female with a history of hypertension, obesity, GERD, chest pain, PE presents to the ED due to transient right upper quadrant pain followed by left sided chest pain. This resolved prior to EMS arrival in the patient was hesitant to come to the hospital however came in to be evaluated. At the time of my exam she is tearful stating that she wants to see her daughter who is visiting from [place]. She states to "ask them" referring to EMS as to why she is here. She denies any pain currently. She has no complaints.   Will obtain basic labs including a troponin. CBC unremarkable. CMP notable for a mildly decreased GFR. Urinalysis is contaminated with squamous epithelial cells, a urine culture was added on. Lipase within normal limits. Initial and delta troponin 0. Lactic acid 0.01 above normal. CT abdomen and pelvis with contrast showed no abscess or mesenteric thickening. No bowel obstruction. No hydronephrosis. Has nodular lesions in the right lung base which have been noted previously. No emergent findings to explain her transient pain. Chest x-ray showed cardiomegaly.  Negative work up.   On reevaluation the patient has both daughters at bedside, is smiling, in no acute distress. She has had no recurrence of this pain. She states she feels "fine". She has no concerns at this time.   I discussed strict return precautions with the patient and her daughters, these were given in writing as well. She is to follow up with her PCP as soon as possible. She is to return to the ED immediately for any recurrence of symptoms or new symptoms.   This case managed in conjunction  with my attending, Dr. Mingo Amber.    Berenice Primas, MD 04/23/14 1643  Evelina Bucy, MD 04/23/14 [phone]

## 2014-04-24 LAB — URINE CULTURE: Colony Count: 85000

## 2014-04-30 ENCOUNTER — Encounter: Payer: Self-pay | Admitting: Emergency Medicine

## 2014-04-30 NOTE — Telephone Encounter (Signed)
Unable to reach pt on both numbers. Multiple attempts made.  Letter placed in outgoing mail.   Will forward to MR to make aware.

## 2014-05-12 ENCOUNTER — Encounter: Payer: Self-pay | Admitting: Internal Medicine

## 2014-05-12 ENCOUNTER — Ambulatory Visit (INDEPENDENT_AMBULATORY_CARE_PROVIDER_SITE_OTHER): Payer: Medicare Other | Admitting: Internal Medicine

## 2014-05-12 VITALS — BP 102/60 | HR 82 | Ht 60.0 in | Wt 202.0 lb

## 2014-05-12 DIAGNOSIS — R590 Localized enlarged lymph nodes: Secondary | ICD-10-CM

## 2014-05-12 DIAGNOSIS — I2699 Other pulmonary embolism without acute cor pulmonale: Secondary | ICD-10-CM

## 2014-05-12 DIAGNOSIS — R599 Enlarged lymph nodes, unspecified: Secondary | ICD-10-CM

## 2014-05-12 NOTE — Patient Instructions (Signed)
You will need to be on blood thinner a minimal of 6 months and after that a decision will need to be made as to whether to continue it or not  The main risk while you are on it is bleeding from falling so please be careful  You will need a repeat ct chest with contrast Fev 6 2016 > we will call to be sure you have it and arrange follow up here at that point if needed or if Dr Alyson Ingles wants [place] to see you for any reason but for now I strongly recommend against doing any procedures as you would need to be off your blood thinner to do this   A copy of your evaluation will be sent to Dr Alyson Ingles

## 2014-05-12 NOTE — Progress Notes (Signed)
Subjective:    Patient ID: Monica Strong, female    DOB: 1931-09-24  MRN: [id number]  HPI  64 yowf never smoker with hx psychosis flared in Aug 2015 > snf/ syncope > readmitted to [facility] snf > new pain under R Rib > dx pe 04/16/14  / abn CT > xarelto and referred by Dr Chucky May to pulmonary clinic 05/12/2014 for eval of cp /abn CT.  Admit date: 04/15/2014 Discharge date: 04/20/2014  Discharge Diagnoses:     Psychosis  Atrial fibrillation  Syncope  Thrombocytopenia  LOC (loss of consciousness)  Faintness  PE (pulmonary embolism)   Discharge Condition: Stable  Diet recommendation: Low salt diet   Filed Weights   04/16/14 2037 04/17/14 2018 04/18/14 2026  Weight: 97.115 kg (214 lb 1.6 oz) 97.251 kg (214 lb 6.4 oz) 96.8 kg (213 lb 6.5 oz)    History of present illness:  80 year old female was brought to the hospital by EMS after patient was found to be unresponsive at the assisted living facility. Patient is a poor historian, but remembers she was about to get the phone and after that everything went blank. There was no seizure-like activity noted by the nursing staff, she was found to be slumped in the chair. Patient was unresponsive for about 10 minutes, though there was no loss of bowel or bladder control, no biting of tongue. In the ED patient was seen by neurology who recommended patient to be admitted for further evaluation including EEG.she also complained of left-sided abdominal pain,CT abdomen pelvis was done which showed interval nodules in the right middle lobe and right upper lobe suspicious for malignancy. She denies chest pain, no shortness of breath no nausea vomiting or diarrhea. She denies palpitations no fever no dysuria urgency or frequency of urination.  Patient see and examined, heart rate going to 130s 140s on exertion.  Hospital Course:  1. Pulmonary embolism- CT angiogram shows moderate size pulmonary emboli in the right upper lobe.we'll start  the patient on heparin per pharmacy consultation and Coumadin.patient will be discharged on Lovenox twice a day along with Coumadin 5 mg daily.    2. Lovenox to be given 6 am and 6 pm.  3. Syncope versus seizure- EEG shows mild loss continuous nonspecific slowing of the cerebral activity consistent with metabolic or toxic encephalopathy., likely patient had syncope from pulmonary embolism. Neurology has seen the patient.CT head is negative for any acute abnormality. 4. Lung mass.- CT chest shows patient has right middle and upper lobe nodules,enlarging prevascular adenopathy . I discussed in detail with family including both daughters, they'll follow up with pulmonary as outpatient for further workup.  5. Atrial fibrillation- Heart rate was not controlled so the dose of Cardizem was increased to60 mg 4 times a day. Patient is on Coumadin. Continue aspirin 81 mg by mouth daily.  6. Somnolence/Paranoia- Patient was taking Seroquel 75 mg po daily in September, and the dose of Seroquel was increased to 400 mg by mouth daily by her PCP, the cut down the dose to 200 mg by mouth daily in the hospital and somnolence has definitely improved. She was also seen by psychiatry, who recommended to continue the same dose, and cutdown to 100 mg by mouth daily, if she again become somnolent. 7. Patient was seen by physical therapy who recommended home health PT. Will arrange for home health PT at the assisted living facility.        05/12/2014 1st [facility] office visit/ Aylla Huffine  Chief Complaint  Patient presents with  . Pulmonary Consult    Referred by Dr. Maury Dus for eval of lung mass and recent PE.  Pt denies any respiratory co's today.    Not limited by breathing from desired activities  But very frail physically  Cp resolved  No obvious day to day or daytime variabilty or assoc chronic cough or h/o hemoptysis  chest tightness, subjective wheeze overt sinus  or hb symptoms. No unusual exp hx or h/o childhood pna/ asthma or knowledge of premature birth.  Sleeping ok without nocturnal  or early am exacerbation  of respiratory  c/o's or need for noct saba. Also denies any obvious fluctuation of symptoms with weather or environmental changes or other aggravating or alleviating factors except as outlined above   Current Medications, Allergies, Complete Past Medical History, Past Surgical History, Family History, and Social History were reviewed in [employer] record.            Review of Systems  Constitutional: Negative for fever, chills and unexpected weight change.  HENT: Negative for congestion, dental problem, ear pain, nosebleeds, postnasal drip, rhinorrhea, sinus pressure, sneezing, sore throat, trouble swallowing and voice change.   Eyes: Negative for visual disturbance.  Respiratory: Negative for cough, choking and shortness of breath.   Cardiovascular: Negative for chest pain and leg swelling.  Gastrointestinal: Negative for vomiting, abdominal pain and diarrhea.  Genitourinary: Negative for difficulty urinating.  Musculoskeletal: Negative for arthralgias.  Skin: Negative for rash.  Neurological: Negative for tremors, syncope and headaches.  Hematological: Does not bruise/bleed easily.       Objective:   Physical Exam  [facility] amb wf nad needs one person assist to get up on exam table / frail and walks with cane   Wt Readings from Last 3 Encounters:  05/12/14 202 lb (91.627 kg)  04/18/14 213 lb 6.5 oz (96.8 kg)  04/05/14 210 lb 14.4 oz (95.664 kg)    Vital signs reviewed    HEENT: nl dentition, turbinates, and orophanx. Nl external ear canals without cough reflex   NECK :  without JVD/Nodes/TM/ nl carotid upstrokes bilaterally   LUNGS: no acc muscle use, clear to A and P bilaterally without cough on insp or exp maneuvers   CV:  RRR  no s3 or murmur or increase in P2, no edema   ABD:  soft and  nontender with nl excursion in the supine position. No bruits or organomegaly, bowel sounds nl  MS:  warm without deformities, calf tenderness, cyanosis or clubbing  SKIN: warm and dry without lesions    NEURO:  alert, approp, no deficits   04/16/14 Moderate-sized pulmonary emboli in the right upper lobe. No evidence of right heart strain. Small pericardial effusion, trace pleural effusions. Cardiomegaly. Enlarging prevascular adenopathy since prior chest CT. Cannot exclude metastatic adenopathy or lymphoproliferative disorder. Right upper lobe and right middle lobe pulmonary nodules are stable since 2014.            Assessment & Plan:

## 2014-05-15 DIAGNOSIS — R590 Localized enlarged lymph nodes: Secondary | ICD-10-CM | POA: Insufficient documentation

## 2014-05-15 NOTE — Assessment & Plan Note (Signed)
-   note never smoker CT 04/16/14 Enlarging prevascular adenopathy since prior chest CT. Cannot exclude metastatic adenopathy or lymphoproliferative disorder. Right upper lobe and right middle lobe pulmonary nodules are stable since 08/2012.  These findings are completely non-specific and don't suggest in any way that she is hypercoagulable due to metastatic ca or that she has ca at all.  If she does, it's not resectable and it's more likely these are just reactive nodes sine the peripheral nodules haven't changed in 6 m  rec repeat CT 07/17/14 and then consider ebus bx if there is definite progression and she can be off anticoagulation for at least 72 h at that point (would need venous dopplers first)

## 2014-05-15 NOTE — Assessment & Plan Note (Addendum)
CT Pos 04/16/14 multiple s RV strain   Clearly precitated  By multiple risk factors:  Afib, extremely sendentary and high risk for recurrence so at the very least needs 6 m of rx unless active bleeding precludes using it   Needs venous dopplers at some point to complete the w/u but if has afib issues and no contraindication to xarelto probably needs to stay on rx indefinitely.   No pulmonary f/u needed unless Dr Alyson Ingles needs our opinion re risk vs benefit.

## 2014-07-15 ENCOUNTER — Other Ambulatory Visit: Payer: Self-pay | Admitting: Internal Medicine

## 2014-07-15 DIAGNOSIS — I2699 Other pulmonary embolism without acute cor pulmonale: Secondary | ICD-10-CM

## 2014-07-15 DIAGNOSIS — R590 Localized enlarged lymph nodes: Secondary | ICD-10-CM

## 2014-08-12 ENCOUNTER — Inpatient Hospital Stay: Admission: RE | Admit: 2014-08-12 | Payer: Self-pay | Source: Ambulatory Visit

## 2014-08-24 ENCOUNTER — Other Ambulatory Visit (INDEPENDENT_AMBULATORY_CARE_PROVIDER_SITE_OTHER): Payer: 59

## 2014-08-24 DIAGNOSIS — R590 Localized enlarged lymph nodes: Secondary | ICD-10-CM

## 2014-08-24 DIAGNOSIS — R599 Enlarged lymph nodes, unspecified: Secondary | ICD-10-CM

## 2014-08-24 LAB — BASIC METABOLIC PANEL
BUN: 19 mg/dL (ref 6–23)
CALCIUM: 9.3 mg/dL (ref 8.4–10.5)
CHLORIDE: 106 meq/L (ref 96–112)
CO2: 33 meq/L — AB (ref 19–32)
Creatinine, Ser: 0.94 mg/dL (ref 0.40–1.20)
GFR: 60.72 mL/min (ref >60.00)
Glucose, Bld: 93 mg/dL (ref 70–99)
Potassium: 4.2 mEq/L (ref 3.5–5.1)
SODIUM: 142 meq/L (ref 135–145)

## 2014-08-24 NOTE — Progress Notes (Signed)
Quick Note:  LMTCB ______ 

## 2014-08-25 NOTE — Progress Notes (Signed)
Quick Note:  LMTCB ______ 

## 2014-09-01 NOTE — Progress Notes (Signed)
Quick Note:  LMTCB ______ 

## 2014-09-06 ENCOUNTER — Ambulatory Visit (INDEPENDENT_AMBULATORY_CARE_PROVIDER_SITE_OTHER)
Admission: RE | Admit: 2014-09-06 | Discharge: 2014-09-06 | Disposition: A | Discharge status: Home / Self Care | Payer: Medicare Other | Source: Ambulatory Visit | Attending: Internal Medicine | Admitting: Internal Medicine

## 2014-09-06 DIAGNOSIS — I2699 Other pulmonary embolism without acute cor pulmonale: Secondary | ICD-10-CM | POA: Diagnosis not present

## 2014-09-06 MED ORDER — IOHEXOL 350 MG/ML SOLN
75.0000 mL | Freq: Once | INTRAVENOUS | Status: AC | PRN
  Administered 2014-09-06: 75 mL via INTRAVENOUS

## 2014-09-07 NOTE — Progress Notes (Signed)
Quick Note:  Called pt's preferred number listed and spoke to pt's daughter, Laureen Abrahams. Informed Shirlean Mylar of the results and recs per MW. [name] verbalized understanding and stated she would relay the results to pt. Nothing further needed at this time. ______

## 2014-09-13 NOTE — Progress Notes (Signed)
Quick Note:  LMTCB ______ 

## 2014-09-14 NOTE — Progress Notes (Signed)
Quick Note:  LMTCB ______ 

## 2014-09-16 NOTE — Progress Notes (Signed)
Quick Note:  lmtcb for pt. ______ 

## 2014-09-20 NOTE — Progress Notes (Signed)
Quick Note:  LMTCB ______ 

## 2014-09-22 ENCOUNTER — Encounter: Payer: Self-pay | Admitting: *Deleted

## 2014-10-09 ENCOUNTER — Emergency Department (HOSPITAL_COMMUNITY)
Admission: EM | Admit: 2014-10-09 | Discharge: 2014-10-09 | Disposition: A | Discharge status: Home / Self Care | Payer: Medicare Other | Source: Home / Self Care | Attending: Family Medicine | Admitting: Family Medicine

## 2014-10-09 ENCOUNTER — Encounter (HOSPITAL_COMMUNITY): Payer: Self-pay | Admitting: *Deleted

## 2014-10-09 ENCOUNTER — Emergency Department (INDEPENDENT_AMBULATORY_CARE_PROVIDER_SITE_OTHER): Payer: Medicare Other

## 2014-10-09 DIAGNOSIS — R5382 Chronic fatigue, unspecified: Secondary | ICD-10-CM | POA: Diagnosis not present

## 2014-10-09 LAB — POCT I-STAT, CHEM 8
BUN: 22 mg/dL (ref 6–23)
CALCIUM ION: 1.23 mmol/L (ref 1.13–1.30)
Chloride: 101 mmol/L (ref 96–112)
Creatinine, Ser: 0.9 mg/dL (ref 0.50–1.10)
GLUCOSE: 111 mg/dL — AB (ref 70–99)
HCT: 41 % (ref 36.0–46.0)
Hemoglobin: 13.9 g/dL (ref 12.0–15.0)
Potassium: 4 mmol/L (ref 3.5–5.1)
Sodium: 141 mmol/L (ref 135–145)
TCO2: 24 mmol/L (ref 0–100)

## 2014-10-09 NOTE — Discharge Instructions (Signed)
Drink plenty of fluids, contact dr reade on mon for follow-up. Return to ER if problems get worse.

## 2014-10-09 NOTE — ED Provider Notes (Signed)
CSN: [id number]     Arrival date & time 10/09/14  1607 History   First MD Initiated Contact with Patient 10/09/14 1704     Chief Complaint  Patient presents with  . Weakness   (Consider location/radiation/quality/duration/timing/severity/associated sxs/prior Treatment) Patient is a 81 y.o. female presenting with weakness. The history is provided by the patient and a relative.  Weakness This is a new problem. The current episode started more than 1 week ago. The problem has not changed since onset.Associated symptoms include chest pain and abdominal pain. Pertinent negatives include no headaches and no shortness of breath. Associated symptoms comments: Extreme fatigue. Doe. edema.    Past Medical History  Diagnosis Date  . Renal stones left  . GERD (gastroesophageal reflux disease)   . H/O hiatal hernia   . Pulmonary nodules BENIGN--  MONITORED BY PCP DR READE--  ASYMPTOMATIC     LAST CHEST CT 08-17-2011  . Heart murmur MILD -- ASYMPTOMATIC  . Urge urinary incontinence   . Sinus drainage   . Neuromuscular disorder     rt hand numbness  . Dysuria     has urethral bump  . Ureteral stent retained   . Arthritis   . OSA (obstructive sleep apnea)     not using cpap  . Paranoia   . Obesity   . Chest pain     a. Normal stress test 08/2008 and CTA neg for PE at that time.  Jola Baptist ear dysfunction     a. Prior h/o vertigo.  Marland Kitchen History of infection due to ESBL Escherichia coli 02/23/2014  . Hypertension    Past Surgical History  Procedure Laterality Date  . Total knee arthroplasty  10-17-2009  DR ALUISIO    LEFT  . Right total knee arthroplasty  04-25-2009  . Tonsillectomy    . Cardiovascular stress test  03-13-2007  dr Tressia Miners turner    NORMAL LV SIZE SYSTOLIC FUCTION/ NO ISCHEMIA/ EF 85%  . Transthoracic echocardiogram  08-18-2008    NORMAL LVSF/ EF 55-60%/ MILD MITRAL REGURG .  Marland Kitchen Removal breast cyst, benign  1960'S  . Vaginal hysterectomy  1970  . Cataract extraction w/  intraocular lens  implant, bilateral    . Blepharoplasty      BILATERAL  . Cystoscopy with retrograde pyelogram, ureteroscopy and stent placement  05/14/2012    Procedure: [place], URETEROSCOPY AND STENT PLACEMENT;  Surgeon: Alexis Frock, MD;  Location: [facility];  Service: Urology;  Laterality: Left;  1ST STAGE LEFT URETEROSCOPY, LEFT RETROGRADE, DIGITAL URETEROSCOPY,  LEFT STONE REMOVAL WITH ESCAPE BASKET,  STENT PLACEMENt   . Holmium laser application  03/16/2693    Procedure: HOLMIUM LASER APPLICATION;  Surgeon: Alexis Frock, MD;  Location: [facility];  Service: Urology;  Laterality: Left;  . Holmium laser application  01/13/4626    Procedure: HOLMIUM LASER APPLICATION;  Surgeon: Alexis Frock, MD;  Location: [facility];  Service: Urology;  Laterality: Left;  . Cystoscopy/retrograde/ureteroscopy/stone extraction with basket  06/13/2012    Procedure: CYSTOSCOPY/RETROGRADE/URETEROSCOPY/STONE EXTRACTION WITH BASKET;  Surgeon: Alexis Frock, MD;  Location[facility];  Service: Urology;  Laterality: Left;  . Cystoscopy w/ ureteral stent placement  06/13/2012    Procedure: CYSTOSCOPY WITH STENT REPLACEMENT;  Surgeon: Alexis Frock, MD;  Location: [facility];  Service: Urology;  Laterality: Left;  . Appendectomy      PT STATES PER XRAY SMALL AMOUNT OF APPENDIX LEFT  . Cystoscopy w/ ureteral  stent removal  07/11/2012    Procedure: CYSTOSCOPY WITH STENT REMOVAL;  Surgeon: Alexis Frock, MD;  Location: [facility];  Service: Urology;  Laterality: Left;  . Cystoscopy w/ retrogrades  07/11/2012    Procedure: CYSTOSCOPY WITH RETROGRADE PYELOGRAM;  Surgeon: Alexis Frock, MD;  Location: [facility];  Service: Urology;  Laterality: Left;  left third stage ureteroscopystone manipulation  . Ureteroscopy  07/11/2012    Procedure: URETEROSCOPY;  Surgeon: Alexis Frock, MD;   Location: [facility];  Service: Urology;  Laterality: Left;   Family History  Problem Relation Age of Onset  . Other      Pt unaware due to psych condition  . Heart attack Father    History  Substance Use Topics  . Smoking status: Never Smoker   . Smokeless tobacco: Never Used  . Alcohol Use: No   OB History    No data available     Review of Systems  Respiratory: Negative for shortness of breath.   Cardiovascular: Positive for chest pain.  Gastrointestinal: Positive for abdominal pain.  Neurological: Positive for weakness. Negative for headaches.    Allergies  Diphenhydramine; Flagyl; Hydrocodone; Meperidine hcl; Penicillins; and Sulfonamide derivatives  Home Medications   Prior to Admission medications   Medication Sig Start Date End Date Taking? Authorizing Provider  HYDROcodone-acetaminophen (LORTAB) 5-325 MG per tablet Take 1 tablet by mouth every 6 (six) hours as needed for moderate pain. 04/20/14   Oswald Hillock, MD  Multiple Vitamins-Minerals (MULTIVITAMIN PO) Take 1 tablet by mouth daily.    Historical Provider, MD  omeprazole (PRILOSEC) 20 MG capsule Take 20 mg by mouth daily.    Historical Provider, MD  polyethylene glycol (MIRALAX / GLYCOLAX) packet Take 17 g by mouth daily. 04/20/14   Oswald Hillock, MD  pramoxine-mineral oil-zinc (TUCKS) 1-12.5 % rectal ointment Place 1 application rectally every 4 (four) hours as needed for itching or irritation.    Historical Provider, MD  QUEtiapine (SEROQUEL XR) 200 MG 24 hr tablet Take 1 tablet (200 mg total) by mouth every evening. 04/20/14   Oswald Hillock, MD  Rivaroxaban (XARELTO) 15 MG TABS tablet Take 15 mg by mouth 2 (two) times daily with a meal.    Historical Provider, MD  senna-docusate (SENOKOT-S) 8.6-50 MG per tablet Take 1 tablet by mouth 2 (two) times daily.    Historical Provider, MD   BP 110/75 mmHg  Pulse 78  Temp(Src) 98.2 F (36.8 C) (Oral)  Resp 18  SpO2 95% Physical Exam    Constitutional: She is oriented to person, place, and time. She appears well-developed and well-nourished.  Neck: Normal range of motion. Neck supple.  Cardiovascular: Normal heart sounds.   Pulmonary/Chest: Effort normal and breath sounds normal. She exhibits tenderness.  Abdominal: Soft. Bowel sounds are normal. There is tenderness in the left upper quadrant.    Musculoskeletal: She exhibits edema.  Lymphadenopathy:    She has no cervical adenopathy.  Neurological: She is alert and oriented to person, place, and time.  Skin: Skin is warm and dry.  Nursing note and vitals reviewed.   ED Course  Procedures (including critical care time) Labs Review Labs Reviewed  POCT I-STAT, CHEM 8 - Abnormal; Notable for the following:    Glucose, Bld 111 (*)    All other components within normal limits   i-stat 8 wnl Imaging Review Dg Chest 2 View  10/09/2014   CLINICAL DATA:  Shortness of breath for 3 weeks.  Previous pulmonary embolism.  EXAM: CHEST  2 VIEW  COMPARISON:  CT on 09/06/2014 and chest radiograph on 04/23/2014  FINDINGS: Mild cardiomegaly and ectasia of thoracic aorta and great vessels remains stable in appearance. No evidence of congestive heart failure. No evidence of pulmonary infiltrate or pleural effusion. Mild thoracic spine degenerative changes and kyphosis noted.  IMPRESSION: Stable mild cardiomegaly and ectasia of thoracic aorta and great vessels. No active lung disease.   Electronically Signed   By: Earle Gell M.D.   On: 10/09/2014 18:24    X-rays reviewed and report per radiologist.  MDM   1. Chronic fatigue    Pt and daughter happy with eval at d/c. Will contact md on mon.    Billy Fischer, MD 10/09/14 [phone]

## 2014-10-09 NOTE — ED Notes (Signed)
Pt  Reports  Weakness    X  1  Week          Fatigued     Recently      Pt  Stays  At  assited  Living             She  At this  Time  Is     Awake    And  Alert   She  Has  Swelling           In   Arms              Pt has  Pain  In  l   Side   At  Times                                Pt  Is  In  A  Wheelchair       Family  Member  At  Bedside                 No  Shortness  Of  Breath  At  This  Time   She  Is   Speaking  In  Complete  sentances

## 2015-01-05 ENCOUNTER — Encounter (HOSPITAL_COMMUNITY): Payer: Self-pay | Admitting: Emergency Medicine

## 2015-01-05 ENCOUNTER — Observation Stay (HOSPITAL_COMMUNITY): Payer: Medicare Other

## 2015-01-05 ENCOUNTER — Emergency Department (HOSPITAL_COMMUNITY): Payer: Medicare Other

## 2015-01-05 ENCOUNTER — Observation Stay (HOSPITAL_COMMUNITY)
Admission: EM | Admit: 2015-01-05 | Discharge: 2015-01-07 | Disposition: A | Discharge status: Home / Self Care | Payer: Medicare Other | Attending: Internal Medicine | Admitting: Internal Medicine

## 2015-01-05 DIAGNOSIS — I4891 Unspecified atrial fibrillation: Secondary | ICD-10-CM | POA: Diagnosis not present

## 2015-01-05 DIAGNOSIS — Z86711 Personal history of pulmonary embolism: Secondary | ICD-10-CM | POA: Insufficient documentation

## 2015-01-05 DIAGNOSIS — Z7901 Long term (current) use of anticoagulants: Secondary | ICD-10-CM | POA: Diagnosis not present

## 2015-01-05 DIAGNOSIS — Z043 Encounter for examination and observation following other accident: Secondary | ICD-10-CM | POA: Diagnosis present

## 2015-01-05 DIAGNOSIS — Y998 Other external cause status: Secondary | ICD-10-CM | POA: Diagnosis not present

## 2015-01-05 DIAGNOSIS — I10 Essential (primary) hypertension: Secondary | ICD-10-CM | POA: Diagnosis not present

## 2015-01-05 DIAGNOSIS — H8390 Unspecified disease of inner ear, unspecified ear: Secondary | ICD-10-CM | POA: Diagnosis not present

## 2015-01-05 DIAGNOSIS — R7989 Other specified abnormal findings of blood chemistry: Secondary | ICD-10-CM | POA: Diagnosis not present

## 2015-01-05 DIAGNOSIS — W19XXXA Unspecified fall, initial encounter: Secondary | ICD-10-CM | POA: Diagnosis not present

## 2015-01-05 DIAGNOSIS — N3941 Urge incontinence: Secondary | ICD-10-CM | POA: Diagnosis not present

## 2015-01-05 DIAGNOSIS — R55 Syncope and collapse: Secondary | ICD-10-CM | POA: Diagnosis not present

## 2015-01-05 DIAGNOSIS — M1990 Unspecified osteoarthritis, unspecified site: Secondary | ICD-10-CM | POA: Insufficient documentation

## 2015-01-05 DIAGNOSIS — F0391 Unspecified dementia with behavioral disturbance: Secondary | ICD-10-CM | POA: Diagnosis not present

## 2015-01-05 DIAGNOSIS — Z7982 Long term (current) use of aspirin: Secondary | ICD-10-CM | POA: Diagnosis not present

## 2015-01-05 DIAGNOSIS — J3489 Other specified disorders of nose and nasal sinuses: Secondary | ICD-10-CM | POA: Insufficient documentation

## 2015-01-05 DIAGNOSIS — F0390 Unspecified dementia without behavioral disturbance: Secondary | ICD-10-CM | POA: Diagnosis present

## 2015-01-05 DIAGNOSIS — K219 Gastro-esophageal reflux disease without esophagitis: Secondary | ICD-10-CM | POA: Insufficient documentation

## 2015-01-05 DIAGNOSIS — Y9389 Activity, other specified: Secondary | ICD-10-CM | POA: Insufficient documentation

## 2015-01-05 DIAGNOSIS — R011 Cardiac murmur, unspecified: Secondary | ICD-10-CM | POA: Insufficient documentation

## 2015-01-05 DIAGNOSIS — W1839XA Other fall on same level, initial encounter: Secondary | ICD-10-CM | POA: Diagnosis not present

## 2015-01-05 DIAGNOSIS — G4733 Obstructive sleep apnea (adult) (pediatric): Secondary | ICD-10-CM | POA: Diagnosis not present

## 2015-01-05 DIAGNOSIS — F22 Delusional disorders: Secondary | ICD-10-CM | POA: Insufficient documentation

## 2015-01-05 DIAGNOSIS — Z880 Allergy status to penicillin: Secondary | ICD-10-CM | POA: Diagnosis not present

## 2015-01-05 DIAGNOSIS — R778 Other specified abnormalities of plasma proteins: Secondary | ICD-10-CM

## 2015-01-05 DIAGNOSIS — M25561 Pain in right knee: Secondary | ICD-10-CM | POA: Diagnosis present

## 2015-01-05 DIAGNOSIS — I482 Chronic atrial fibrillation: Secondary | ICD-10-CM | POA: Diagnosis not present

## 2015-01-05 DIAGNOSIS — N200 Calculus of kidney: Secondary | ICD-10-CM | POA: Diagnosis not present

## 2015-01-05 DIAGNOSIS — R52 Pain, unspecified: Secondary | ICD-10-CM

## 2015-01-05 DIAGNOSIS — G709 Myoneural disorder, unspecified: Secondary | ICD-10-CM | POA: Insufficient documentation

## 2015-01-05 DIAGNOSIS — Z79899 Other long term (current) drug therapy: Secondary | ICD-10-CM | POA: Diagnosis not present

## 2015-01-05 DIAGNOSIS — Y9289 Other specified places as the place of occurrence of the external cause: Secondary | ICD-10-CM | POA: Insufficient documentation

## 2015-01-05 DIAGNOSIS — R531 Weakness: Secondary | ICD-10-CM | POA: Insufficient documentation

## 2015-01-05 DIAGNOSIS — F29 Unspecified psychosis not due to a substance or known physiological condition: Secondary | ICD-10-CM | POA: Diagnosis not present

## 2015-01-05 DIAGNOSIS — R109 Unspecified abdominal pain: Secondary | ICD-10-CM | POA: Diagnosis present

## 2015-01-05 DIAGNOSIS — R413 Other amnesia: Secondary | ICD-10-CM

## 2015-01-05 HISTORY — DX: Other pulmonary embolism without acute cor pulmonale: I26.99

## 2015-01-05 HISTORY — DX: Other amnesia: R41.3

## 2015-01-05 HISTORY — DX: Unspecified atrial fibrillation: I48.91

## 2015-01-05 HISTORY — DX: Brief psychotic disorder: F23

## 2015-01-05 LAB — BASIC METABOLIC PANEL
Anion gap: 9 (ref 5–15)
BUN: 16 mg/dL (ref 6–20)
CHLORIDE: 105 mmol/L (ref 101–111)
CO2: 27 mmol/L (ref 22–32)
Calcium: 9.1 mg/dL (ref 8.9–10.3)
Creatinine, Ser: 0.94 mg/dL (ref 0.44–1.00)
GFR calc non Af Amer: 55 mL/min — ABNORMAL LOW (ref >60)
Glucose, Bld: 126 mg/dL — ABNORMAL HIGH (ref 65–99)
POTASSIUM: 3.4 mmol/L — AB (ref 3.5–5.1)
Sodium: 141 mmol/L (ref 135–145)

## 2015-01-05 LAB — TROPONIN I
TROPONIN I: 0.07 ng/mL — AB (ref <0.031)
TROPONIN I: 0.22 ng/mL — AB (ref <0.031)

## 2015-01-05 LAB — URINALYSIS, ROUTINE W REFLEX MICROSCOPIC
BILIRUBIN URINE: NEGATIVE
Glucose, UA: NEGATIVE mg/dL
HGB URINE DIPSTICK: NEGATIVE
Ketones, ur: NEGATIVE mg/dL
Leukocytes, UA: NEGATIVE
Nitrite: NEGATIVE
PROTEIN: NEGATIVE mg/dL
Specific Gravity, Urine: 1.009 (ref 1.005–1.030)
Urobilinogen, UA: 0.2 mg/dL (ref 0.0–1.0)
pH: 7.5 (ref 5.0–8.0)

## 2015-01-05 LAB — CBC WITH DIFFERENTIAL/PLATELET
BASOS ABS: 0 10*3/uL (ref 0.0–0.1)
BASOS PCT: 0 % (ref 0–1)
EOS PCT: 0 % (ref 0–5)
Eosinophils Absolute: 0 10*3/uL (ref 0.0–0.7)
HEMATOCRIT: 38.6 % (ref 36.0–46.0)
Hemoglobin: 12.7 g/dL (ref 12.0–15.0)
LYMPHS PCT: 9 % — AB (ref 12–46)
Lymphs Abs: 0.7 10*3/uL (ref 0.7–4.0)
MCH: 32.8 pg (ref 26.0–34.0)
MCHC: 32.9 g/dL (ref 30.0–36.0)
MCV: 99.7 fL (ref 78.0–100.0)
MONO ABS: 0.8 10*3/uL (ref 0.1–1.0)
MONOS PCT: 11 % (ref 3–12)
Neutro Abs: 5.6 10*3/uL (ref 1.7–7.7)
Neutrophils Relative %: 80 % — ABNORMAL HIGH (ref 43–77)
Platelets: 155 10*3/uL (ref 150–400)
RBC: 3.87 MIL/uL (ref 3.87–5.11)
RDW: 13.9 % (ref 11.5–15.5)
WBC: 7 10*3/uL (ref 4.0–10.5)

## 2015-01-05 LAB — VALPROIC ACID LEVEL: Valproic Acid Lvl: <10 ug/mL — ABNORMAL LOW (ref 50.0–100.0)

## 2015-01-05 LAB — TSH: TSH: 0.914 u[IU]/mL (ref 0.350–4.500)

## 2015-01-05 LAB — MAGNESIUM: Magnesium: 1.8 mg/dL (ref 1.7–2.4)

## 2015-01-05 MED ORDER — POLYETHYLENE GLYCOL 3350 17 G PO PACK
17.0000 g | PACK | Freq: Every day | ORAL | Status: DC
Start: 2015-01-05 — End: 2015-01-07
  Administered 2015-01-06 – 2015-01-07 (×2): 17 g via ORAL
  Filled 2015-01-05 (×2): qty 1

## 2015-01-05 MED ORDER — DOCUSATE SODIUM 100 MG PO CAPS
100.0000 mg | ORAL_CAPSULE | Freq: Every day | ORAL | Status: DC | PRN
  Administered 2015-01-05: 100 mg via ORAL
  Filled 2015-01-05: qty 1

## 2015-01-05 MED ORDER — ONDANSETRON HCL 4 MG/2ML IJ SOLN
4.0000 mg | Freq: Four times a day (QID) | INTRAMUSCULAR | Status: DC | PRN
Start: 2015-01-05 — End: 2015-01-07

## 2015-01-05 MED ORDER — DIVALPROEX SODIUM ER 500 MG PO TB24: 500.0000 mg | ORAL_TABLET | Freq: Two times a day (BID) | ORAL | Status: DC

## 2015-01-05 MED ORDER — TROLAMINE SALICYLATE 10 % EX CREA
TOPICAL_CREAM | Freq: Two times a day (BID) | CUTANEOUS | Status: DC
  Filled 2015-01-05: qty 85

## 2015-01-05 MED ORDER — SODIUM CHLORIDE 0.9 % IJ SOLN
3.0000 mL | Freq: Two times a day (BID) | INTRAMUSCULAR | Status: DC
  Administered 2015-01-05 – 2015-01-07 (×3): 3 mL via INTRAVENOUS

## 2015-01-05 MED ORDER — ALUM & MAG HYDROXIDE-SIMETH 200-200-20 MG/5ML PO SUSP: 30.0000 mL | Freq: Four times a day (QID) | ORAL | Status: DC | PRN

## 2015-01-05 MED ORDER — MULTIVITAMIN PO LIQD: Freq: Every day | ORAL | Status: DC

## 2015-01-05 MED ORDER — ONDANSETRON HCL 4 MG PO TABS: 4.0000 mg | ORAL_TABLET | Freq: Four times a day (QID) | ORAL | Status: DC | PRN

## 2015-01-05 MED ORDER — RISPERIDONE 2 MG PO TBDP
3.0000 mg | ORAL_TABLET | Freq: Every day | ORAL | Status: DC
  Administered 2015-01-05: 3 mg via ORAL
  Filled 2015-01-05 (×2): qty 1

## 2015-01-05 MED ORDER — RISPERIDONE 2 MG PO TBDP: 3.0000 mg | ORAL_TABLET | Freq: Every day | ORAL | Status: DC

## 2015-01-05 MED ORDER — ADULT MULTIVITAMIN LIQUID CH
5.0000 mL | Freq: Every day | ORAL | Status: DC
  Administered 2015-01-05 – 2015-01-07 (×3): 5 mL via ORAL
  Filled 2015-01-05 (×3): qty 5

## 2015-01-05 MED ORDER — DIVALPROEX SODIUM 500 MG PO DR TAB
500.0000 mg | DELAYED_RELEASE_TABLET | ORAL | Status: DC
  Administered 2015-01-05: 500 mg via ORAL
  Filled 2015-01-05: qty 1

## 2015-01-05 MED ORDER — ASPIRIN 81 MG PO CHEW
81.0000 mg | CHEWABLE_TABLET | Freq: Every day | ORAL | Status: DC
  Administered 2015-01-06 – 2015-01-07 (×2): 81 mg via ORAL
  Filled 2015-01-05 (×2): qty 1

## 2015-01-05 MED ORDER — SODIUM CHLORIDE 0.9 % IV BOLUS (SEPSIS)
500.0000 mL | Freq: Once | INTRAVENOUS | Status: AC
  Administered 2015-01-05: 500 mL via INTRAVENOUS

## 2015-01-05 MED ORDER — PANTOPRAZOLE SODIUM 40 MG PO TBEC
40.0000 mg | DELAYED_RELEASE_TABLET | Freq: Every day | ORAL | Status: DC
  Administered 2015-01-06 – 2015-01-07 (×2): 40 mg via ORAL
  Filled 2015-01-05 (×2): qty 1

## 2015-01-05 MED ORDER — DILTIAZEM HCL ER 180 MG PO CP24
180.0000 mg | ORAL_CAPSULE | Freq: Every day | ORAL | Status: DC
  Administered 2015-01-06 – 2015-01-07 (×2): 180 mg via ORAL
  Filled 2015-01-05 (×4): qty 1

## 2015-01-05 MED ORDER — RIVAROXABAN 20 MG PO TABS
20.0000 mg | ORAL_TABLET | Freq: Every day | ORAL | Status: DC
  Administered 2015-01-06 – 2015-01-07 (×2): 20 mg via ORAL
  Filled 2015-01-05 (×2): qty 1

## 2015-01-05 MED ORDER — ACETAMINOPHEN 325 MG PO TABS
650.0000 mg | ORAL_TABLET | Freq: Four times a day (QID) | ORAL | Status: DC | PRN
  Administered 2015-01-05: 650 mg via ORAL
  Filled 2015-01-05 (×2): qty 2

## 2015-01-05 MED ORDER — FUROSEMIDE 40 MG PO TABS
40.0000 mg | ORAL_TABLET | Freq: Every day | ORAL | Status: DC
  Administered 2015-01-06 – 2015-01-07 (×2): 40 mg via ORAL
  Filled 2015-01-05 (×2): qty 1

## 2015-01-05 MED ORDER — POTASSIUM CHLORIDE CRYS ER 20 MEQ PO TBCR
40.0000 meq | EXTENDED_RELEASE_TABLET | Freq: Once | ORAL | Status: AC
  Administered 2015-01-05: 40 meq via ORAL
  Filled 2015-01-05: qty 2

## 2015-01-05 MED ORDER — MUSCLE RUB 10-15 % EX CREA
TOPICAL_CREAM | Freq: Two times a day (BID) | CUTANEOUS | Status: DC
  Administered 2015-01-05 – 2015-01-07 (×4): via TOPICAL
  Filled 2015-01-05: qty 85

## 2015-01-05 NOTE — Discharge Instructions (Signed)
If you were given medicines take as directed.  If you are on coumadin or contraceptives realize their levels and effectiveness is altered by many different medicines.  If you have any reaction (rash, tongues swelling, other) to the medicines stop taking and see a physician.    If your blood pressure was elevated in the ER make sure you follow up for management with a primary doctor or return for chest pain, shortness of breath or stroke symptoms.  Please follow up as directed and return to the ER or see a physician for new or worsening symptoms.  Thank you. Filed Vitals:   01/05/15 1338 01/05/15 1415 01/05/15 1430  BP: 129/114 108/69 103/70  Pulse: 81 85 95  Temp: 97.9 F (36.6 C)    Resp: 26 17 16   SpO2:  96% 94%

## 2015-01-05 NOTE — ED Notes (Signed)
To ED via [facility] with c/o fell while walking down the hall-- lives at [facility] assisted living. Pt is Alert oriented to person and place, not time.

## 2015-01-05 NOTE — H&P (Signed)
[facility] History and Physical                                                                                    Monica Strong, is a 81 y.o. female  MRN: [id number]   DOB - 1931-07-23  Admit Date - 01/05/2015  Outpatient Primary MD for the patient is Monica Austria, MD  Referring Physician:  Dr. Reather Converse  Chief Complaint:   Chief Complaint  Patient presents with  . Fall     HPI  Monica Strong  is a 81 y.o. female, with atrial fibrillation, diastolic heart failure, dementia and a history of syncope. She was brought to the emergency department today from assisted living after an unwitnessed fall/possible syncopal episode.  [name] daughter provides the history as the patient is pleasantly demented and unable to provide her own history. This [facility] at [facility] noticed that the patient was weaker getting out of bed. She went to lunch on her walker as usual, but during lunch got up to use the bathroom. When she did not return staff found her on the floor of the bathroom. The patient has no recollection of the event. She has had at least 2 hospitalizations for syncope related to atrial fibrillation. She is currently taking Xarelto.  She denies chest pain, recent illness, dysuria, changes in bowel habits, palpitations. The patient has been becoming progressively weaker over the last several months. She has started to develop lower extremity swelling and has been placed on Lasix.  She is also complaining of left upper quadrant pain. Her daughter reports that she has had episodes of diverticulitis in the past that presented as pain in her left upper quadrant.  In the emergency department her head CT is negative, labs are significant for a troponin of 0.07. And a potassium of 3.4.  Urinalysis is negative for infection.  Review of Systems   In addition to the HPI above,  No Fever-chills, No Headache, No changes with Vision or hearing, No problems swallowing food or  Liquids, No Chest pain, Cough or Shortness of Breath, No Nausea or Vomiting, Bowel movements are regular, No Blood in stool or Urine, No dysuria, No new skin rashes or bruises, No new joints pains-aches,  No new weakness, tingling, numbness in any extremity, No recent weight gain or loss, ++ Her daughter mentions that the patient tends to become more confused at night in the hospital A full 10 point Review of Systems was done, except as stated above, all other Review of Systems were negative.  Past Medical History  Past Medical History  Diagnosis Date  . Renal stones left  . GERD (gastroesophageal reflux disease)   . H/O hiatal hernia   . Pulmonary nodules BENIGN--  MONITORED BY PCP DR READE--  ASYMPTOMATIC     LAST CHEST CT 08-17-2011  . Heart murmur MILD -- ASYMPTOMATIC  . Urge urinary incontinence   . Sinus drainage   . Neuromuscular disorder     rt hand numbness  . Dysuria     has urethral bump  . Ureteral stent retained   . Arthritis   . OSA (obstructive sleep apnea)  not using cpap  . Paranoia   . Obesity   . Chest pain     a. Normal stress test 08/2008 and CTA neg for PE at that time.  Jola Baptist ear dysfunction     a. Prior h/o vertigo.  Marland Kitchen History of infection due to ESBL Escherichia coli 02/23/2014  . Hypertension     Past Surgical History  Procedure Laterality Date  . Total knee arthroplasty  10-17-2009  DR ALUISIO    LEFT  . Right total knee arthroplasty  04-25-2009  . Tonsillectomy    . Cardiovascular stress test  03-13-2007  dr Tressia Miners turner    NORMAL LV SIZE SYSTOLIC FUCTION/ NO ISCHEMIA/ EF 85%  . Transthoracic echocardiogram  08-18-2008    NORMAL LVSF/ EF 55-60%/ MILD MITRAL REGURG .  Marland Kitchen Removal breast cyst, benign  1960'S  . Vaginal hysterectomy  1970  . Cataract extraction w/ intraocular lens  implant, bilateral    . Blepharoplasty      BILATERAL  . Cystoscopy with retrograde pyelogram, ureteroscopy and stent placement  05/14/2012    Procedure:  [place], URETEROSCOPY AND STENT PLACEMENT;  Surgeon: Alexis Frock, MD;  Location: [facility];  Service: Urology;  Laterality: Left;  1ST STAGE LEFT URETEROSCOPY, LEFT RETROGRADE, DIGITAL URETEROSCOPY,  LEFT STONE REMOVAL WITH ESCAPE BASKET,  STENT PLACEMENt   . Holmium laser application  16/06/958    Procedure: HOLMIUM LASER APPLICATION;  Surgeon: Alexis Frock, MD;  Location[facility];  Service: Urology;  Laterality: Left;  . Holmium laser application  09/13/4096    Procedure: HOLMIUM LASER APPLICATION;  Surgeon: Alexis Frock, MD;  Location: [facility];  Service: Urology;  Laterality: Left;  . Cystoscopy/retrograde/ureteroscopy/stone extraction with basket  06/13/2012    Procedure: CYSTOSCOPY/RETROGRADE/URETEROSCOPY/STONE EXTRACTION WITH BASKET;  Surgeon: Alexis Frock, MD;  Location: [facility];  Service: Urology;  Laterality: Left;  . Cystoscopy w/ ureteral stent placement  06/13/2012    Procedure: CYSTOSCOPY WITH STENT REPLACEMENT;  Surgeon: Alexis Frock, MD;  Location: [facility];  Service: Urology;  Laterality: Left;  . Appendectomy      PT STATES PER XRAY SMALL AMOUNT OF APPENDIX LEFT  . Cystoscopy w/ ureteral stent removal  07/11/2012    Procedure: CYSTOSCOPY WITH STENT REMOVAL;  Surgeon: Alexis Frock, MD;  Location: [facility];  Service: Urology;  Laterality: Left;  . Cystoscopy w/ retrogrades  07/11/2012    Procedure: CYSTOSCOPY WITH RETROGRADE PYELOGRAM;  Surgeon: Alexis Frock, MD;  Location: [facility];  Service: Urology;  Laterality: Left;  left third stage ureteroscopystone manipulation  . Ureteroscopy  07/11/2012    Procedure: URETEROSCOPY;  Surgeon: Alexis Frock, MD;  Location: [facility];  Service: Urology;  Laterality: Left;      Social History History  Substance Use Topics  . Smoking status: Never Smoker   .  Smokeless tobacco: Never Used  . Alcohol Use: No   lives at an assisted living facility. Walks with a walker.  Family History Family History  Problem Relation Age of Onset  . Other      Pt unaware due to psych condition  . Heart attack Father     Prior to Admission medications   Medication Sig Start Date End Date Taking? Authorizing Provider  acetaminophen (TYLENOL) 325 MG tablet Take 650 mg by mouth every 6 (six) hours as needed for mild pain.   Yes Historical Provider, MD  aspirin 81  MG chewable tablet Chew 81 mg by mouth daily.   Yes Historical Provider, MD  Cholecalciferol (VITAMIN D3) 2000 UNITS capsule Take 2,000 Units by mouth daily.   Yes Historical Provider, MD  Cyanocobalamin 1000 MCG/ML KIT Inject 1,000 mLs as directed every 30 (thirty) days.   Yes Historical Provider, MD  dextromethorphan (DELSYM) 30 MG/5ML liquid Take 60 mg by mouth every 12 (twelve) hours as needed for cough.   Yes Historical Provider, MD  diltiazem (DILACOR XR) 180 MG 24 hr capsule Take 180 mg by mouth daily.   Yes Historical Provider, MD  divalproex (DEPAKOTE ER) 500 MG 24 hr tablet Take 500 mg by mouth 2 (two) times daily.   Yes Historical Provider, MD  docusate sodium (COLACE) 100 MG capsule Take 100 mg by mouth daily as needed for mild constipation.   Yes Historical Provider, MD  furosemide (LASIX) 40 MG tablet Take 40 mg by mouth daily.   Yes Historical Provider, MD  Multiple Vitamins-Minerals (MULTIVITAMIN PO) Take 1 tablet by mouth daily.   Yes Historical Provider, MD  omeprazole (PRILOSEC) 20 MG capsule Take 20 mg by mouth daily.   Yes Historical Provider, MD  polyethylene glycol (MIRALAX / GLYCOLAX) packet Take 17 g by mouth daily. 04/20/14  Yes Oswald Hillock, MD  risperidone (RISPERDAL M-TABS) 3 MG disintegrating tablet Take 3 mg by mouth at bedtime.   Yes Historical Provider, MD  rivaroxaban (XARELTO) 20 MG TABS tablet Take 20 mg by mouth daily.   Yes Historical Provider, MD    Allergies   Allergen Reactions  . Diphenhydramine Nausea Only    Sick to stomach  . Flagyl [Metronidazole] Other (See Comments)    HALLUCINATIONS  . Hydrocodone Other (See Comments)    HALLUCINATIONS  . Meperidine Hcl Nausea And Vomiting  . Penicillins Hives  . Sulfonamide Derivatives Hives    Physical Exam  Vitals  Blood pressure 133/67, pulse 75, temperature 97.9 F (36.6 C), resp. rate 13, SpO2 97 %.   General: Obese, pleasant female lying in bed in NAD, daughter at bedside  Psych:  Pleasantly demented. Awake, alert, oriented to person and place.  Neuro:   No F.N deficits, ALL C.Nerves Intact, Strength 5/5 all 4 extremities, Sensation intact all 4 extremities.  ENT:  Ears and Eyes appear Normal, Conjunctivae clear, PER. Moist oral mucosa without erythema or exudates.  Neck:  Supple, thick No lymphadenopathy appreciated  Respiratory:  Symmetrical chest wall movement, Good air movement bilaterally, CTAB.  Cardiac:  Irregularly irregular, bilateral 1-2+ LE edema noted, no JVD, No Murmurs. pain palpation of left chest.  Abdomen:  Positive bowel sounds, Soft, tender to palpation left upper quadrant, Non distended,  No masses appreciated  Skin:  No Cyanosis, Normal Skin Turgor, No Skin Rash or Bruise. Right lower extremity is slightly pink and swollen.  Extremities:  Diffusely weak, but 5 over 5 strength in each extremity, lower extremities have 2+ pitting edema.  Data Review  CBC  Recent Labs Lab 01/05/15 1417  WBC 7.0  HGB 12.7  HCT 38.6  PLT 155  MCV 99.7  MCH 32.8  MCHC 32.9  RDW 13.9  LYMPHSABS 0.7  MONOABS 0.8  EOSABS 0.0  BASOSABS 0.0    Chemistries   Recent Labs Lab 01/05/15 1417  NA 141  K 3.4*  CL 105  CO2 27  GLUCOSE 126*  BUN 16  CREATININE 0.94  CALCIUM 9.1    Cardiac Enzymes  Recent Labs Lab 01/05/15 1417  TROPONINI 0.07*  Urinalysis    Component Value Date/Time   COLORURINE YELLOW 01/05/2015 [place]  01/05/2015 1539   LABSPEC 1.009 01/05/2015 1539   PHURINE 7.5 01/05/2015 1539   GLUCOSEU NEGATIVE 01/05/2015 1539   HGBUR NEGATIVE 01/05/2015 1539   BILIRUBINUR NEGATIVE 01/05/2015 1539   KETONESUR NEGATIVE 01/05/2015 1539   PROTEINUR NEGATIVE 01/05/2015 1539   UROBILINOGEN 0.2 01/05/2015 1539   NITRITE NEGATIVE 01/05/2015 1539   LEUKOCYTESUR NEGATIVE 01/05/2015 1539    Imaging results:   Ct Head Wo Contrast  01/05/2015   CLINICAL DATA:  Fell wall walking down the hall. Altered mental status  EXAM: CT HEAD WITHOUT CONTRAST  TECHNIQUE: Contiguous axial images were obtained from the base of the skull through the vertex without intravenous contrast.  COMPARISON:  04/16/2014  FINDINGS: There is no evidence of mass effect, midline shift, or extra-axial fluid collections. There is no evidence of a space-occupying lesion or intracranial hemorrhage. There is no evidence of a cortical-based area of acute infarction. There is generalized cerebral atrophy. There is periventricular white matter low attenuation likely secondary to microangiopathy.  The ventricles and sulci are appropriate for the patient's age. The basal cisterns are patent.  Visualized portions of the orbits are unremarkable. The visualized portions of the paranasal sinuses and mastoid air cells are unremarkable.  The osseous structures are unremarkable.  IMPRESSION: 1. No acute intracranial pathology. 2. Chronic microvascular disease and cerebral atrophy.   Electronically Signed   By: Kathreen Devoid   On: 01/05/2015 15:53    My personal review of EKG: Atrial fibrillation with PVCs, QT is not prolonged.   Assessment & Plan  Principal Problem:   Fall Active Problems:   Atrial fibrillation   Elevated troponin   Morbid obesity   Obstructive sleep apnea   Abdominal pain   Knee pain, right   Dementia   Unwitnessed fall versus possible syncopal event. CT head is negative Will check 2-D echo.  Previous echo was in 2015 and showed an  LVEF of 65-70%. Check TSH.  Cycle troponins. Physical therapy and occupational therapy evaluation.  Patient may need higher level of care. The patient does not appear to be on any obvious medications that would cause a fall. I will check a depakote level.  Elevated troponin Patient denies chest pain Patient is on both an aspirin and Xeralto.  I will continue these for now, but perhaps the aspirin can be reassessed. Will cycle troponins. If they become significantly elevated would recommend calling cardiology.  Atrial fibrillation Rate controlled, will continue diltiazem and  Xarelto  Right knee pain Likely secondary to fall Will x-ray right knee and apply ice, Aspercreme as needed for supportive care.  Abdominal pain Belly soft, mildly tender. Patient has been eating well. Will check a two-view abdominal x-ray. Daughter states she has had diverticulitis pain in this area in the past.  Obstructive sleep apnea Patient does not C Pap Will place her on oxygen daily at bedtime she likely has hypoventilation syndrome.  Mild hypokalemia Will replete with oral supplementation Check a mag level.   Consultants Called:  none  Family Communication:   Daughter at bedside.  Code Status:  DNR  Condition:  Stable.  Potential Disposition: return to ALF vs upgrade to SNF depending on what [facility] will accomodate. Likely to d/c in 24 - 48 hours.  Time spent in minutes : Reserve,  PA-C on 01/05/2015 at 5:46 PM Between 7am to 7pm - Pager -  [phone] After 7pm go to [email or web address] - password [email or web address] And look for the night coverage person covering me after hours  [facility]

## 2015-01-05 NOTE — ED Provider Notes (Signed)
CSN: [id number]     Arrival date & time 01/05/15  1322 History   First MD Initiated Contact with Patient 01/05/15 1324     Chief Complaint  Patient presents with  . Fall     (Consider location/radiation/quality/duration/timing/severity/associated sxs/prior Treatment) HPI Comments: 81 year old female with history of obesity sleep apnea, atrial fibrillation, UTI, pulmonary embolism on Xarelto lives in assisted-living presents after fall. Patient does not recall specific events she remembers walking towards the bathroom and then was found on the ground. Patient denies any pain shortness of breath or other symptoms currently. Daughter feels she is at baseline with mild memory difficulties general weakness requiring a walker. Patient does not have any stroke history. Unknown if head injury. Patient denies any shortness of breath or chest pain. Patient is been taking Xarelto as directed. No urinary symptoms no fevers. Event happened earlier today, daughter said that she was a little weaker generally this morning per report.  The history is provided by the patient.    Past Medical History  Diagnosis Date  . Renal stones left  . GERD (gastroesophageal reflux disease)   . H/O hiatal hernia   . Pulmonary nodules BENIGN--  MONITORED BY PCP DR READE--  ASYMPTOMATIC     LAST CHEST CT 08-17-2011  . Heart murmur MILD -- ASYMPTOMATIC  . Urge urinary incontinence   . Sinus drainage   . Neuromuscular disorder     rt hand numbness  . Dysuria     has urethral bump  . Ureteral stent retained   . Arthritis   . OSA (obstructive sleep apnea)     not using cpap  . Paranoia   . Obesity   . Chest pain     a. Normal stress test 08/2008 and CTA neg for PE at that time.  [facility] ear dysfunction     a. Prior h/o vertigo.  Marland Kitchen History of infection due to ESBL Escherichia coli 02/23/2014  . Hypertension    Past Surgical History  Procedure Laterality Date  . Total knee arthroplasty  10-17-2009  DR ALUISIO     LEFT  . Right total knee arthroplasty  04-25-2009  . Tonsillectomy    . Cardiovascular stress test  03-13-2007  dr Tressia Miners turner    NORMAL LV SIZE SYSTOLIC FUCTION/ NO ISCHEMIA/ EF 85%  . Transthoracic echocardiogram  08-18-2008    NORMAL LVSF/ EF 55-60%/ MILD MITRAL REGURG .  Marland Kitchen Removal breast cyst, benign  1960'S  . Vaginal hysterectomy  1970  . Cataract extraction w/ intraocular lens  implant, bilateral    . Blepharoplasty      BILATERAL  . Cystoscopy with retrograde pyelogram, ureteroscopy and stent placement  05/14/2012    Procedure: [place], URETEROSCOPY AND STENT PLACEMENT;  Surgeon: Alexis Frock, MD;  Location: [facility];  Service: Urology;  Laterality: Left;  1ST STAGE LEFT URETEROSCOPY, LEFT RETROGRADE, DIGITAL URETEROSCOPY,  LEFT STONE REMOVAL WITH ESCAPE BASKET,  STENT PLACEMENt   . Holmium laser application  81/06/5724    Procedure: HOLMIUM LASER APPLICATION;  Surgeon: Alexis Frock, MD;  Location: [facility];  Service: Urology;  Laterality: Left;  . Holmium laser application  2/0/3559    Procedure: HOLMIUM LASER APPLICATION;  Surgeon: Alexis Frock, MD;  Location: [facility];  Service: Urology;  Laterality: Left;  . Cystoscopy/retrograde/ureteroscopy/stone extraction with basket  06/13/2012    Procedure: CYSTOSCOPY/RETROGRADE/URETEROSCOPY/STONE EXTRACTION WITH BASKET;  Surgeon: Alexis Frock, MD;  Location: [facility];  Service: Urology;  Laterality: Left;  . Cystoscopy w/ ureteral stent placement  06/13/2012    Procedure: CYSTOSCOPY WITH STENT REPLACEMENT;  Surgeon: Alexis Frock, MD;  Location: [facility];  Service: Urology;  Laterality: Left;  . Appendectomy      PT STATES PER XRAY SMALL AMOUNT OF APPENDIX LEFT  . Cystoscopy w/ ureteral stent removal  07/11/2012    Procedure: CYSTOSCOPY WITH STENT REMOVAL;  Surgeon: Alexis Frock, MD;  Location: [facility];  Service: Urology;  Laterality: Left;  . Cystoscopy w/ retrogrades  07/11/2012    Procedure: CYSTOSCOPY WITH RETROGRADE PYELOGRAM;  Surgeon: Alexis Frock, MD;  Location: [facility];  Service: Urology;  Laterality: Left;  left third stage ureteroscopystone manipulation  . Ureteroscopy  07/11/2012    Procedure: URETEROSCOPY;  Surgeon: Alexis Frock, MD;  Location: [facility];  Service: Urology;  Laterality: Left;   Family History  Problem Relation Age of Onset  . Other      Pt unaware due to psych condition  . Heart attack Father    History  Substance Use Topics  . Smoking status: Never Smoker   . Smokeless tobacco: Never Used  . Alcohol Use: No   OB History    No data available     Review of Systems  Constitutional: Negative for fever and chills.  HENT: Negative for congestion.   Eyes: Negative for visual disturbance.  Respiratory: Negative for shortness of breath.   Cardiovascular: Negative for chest pain.  Gastrointestinal: Negative for vomiting and abdominal pain.  Genitourinary: Negative for dysuria and flank pain.  Musculoskeletal: Negative for back pain, neck pain and neck stiffness.  Skin: Negative for rash.  Neurological: Positive for weakness (general). Negative for light-headedness and headaches.      Allergies  Diphenhydramine; Flagyl; Hydrocodone; Meperidine hcl; Penicillins; and Sulfonamide derivatives  Home Medications   Prior to Admission medications   Medication Sig Start Date End Date Taking? Authorizing Provider  acetaminophen (TYLENOL) 325 MG tablet Take 650 mg by mouth every 6 (six) hours as needed for mild pain.   Yes Historical Provider, MD  aspirin 81 MG chewable tablet Chew 81 mg by mouth daily.   Yes Historical Provider, MD  Cholecalciferol (VITAMIN D3) 2000 UNITS capsule Take 2,000 Units by mouth daily.   Yes Historical Provider, MD  Cyanocobalamin 1000 MCG/ML KIT Inject 1,000 mLs as directed every 30  (thirty) days.   Yes Historical Provider, MD  dextromethorphan (DELSYM) 30 MG/5ML liquid Take 60 mg by mouth every 12 (twelve) hours as needed for cough.   Yes Historical Provider, MD  diltiazem (DILACOR XR) 180 MG 24 hr capsule Take 180 mg by mouth daily.   Yes Historical Provider, MD  divalproex (DEPAKOTE ER) 500 MG 24 hr tablet Take 500 mg by mouth 2 (two) times daily.   Yes Historical Provider, MD  docusate sodium (COLACE) 100 MG capsule Take 100 mg by mouth daily as needed for mild constipation.   Yes Historical Provider, MD  furosemide (LASIX) 40 MG tablet Take 40 mg by mouth daily.   Yes Historical Provider, MD  Multiple Vitamins-Minerals (MULTIVITAMIN PO) Take 1 tablet by mouth daily.   Yes Historical Provider, MD  omeprazole (PRILOSEC) 20 MG capsule Take 20 mg by mouth daily.   Yes Historical Provider, MD  polyethylene glycol (MIRALAX / GLYCOLAX) packet Take 17 g by mouth daily. 04/20/14  Yes Oswald Hillock, MD  risperidone (RISPERDAL M-TABS) 3 MG disintegrating tablet Take  3 mg by mouth at bedtime.   Yes Historical Provider, MD  rivaroxaban (XARELTO) 20 MG TABS tablet Take 20 mg by mouth daily.   Yes Historical Provider, MD  HYDROcodone-acetaminophen (LORTAB) 5-325 MG per tablet Take 1 tablet by mouth every 6 (six) hours as needed for moderate pain. Patient not taking: Reported on 01/05/2015 04/20/14   Meredeth Ide, MD  QUEtiapine (SEROQUEL XR) 200 MG 24 hr tablet Take 1 tablet (200 mg total) by mouth every evening. Patient not taking: Reported on 01/05/2015 04/20/14   Meredeth Ide, MD   BP 124/53 mmHg  Pulse 68  Temp(Src) 97.9 F (36.6 C)  Resp 17  SpO2 96% Physical Exam  Constitutional: She is oriented to person, place, and time. She appears well-developed and well-nourished.  HENT:  Head: Normocephalic and atraumatic.  Mild dry mucous membranes  Eyes: Conjunctivae are normal. Right eye exhibits no discharge. Left eye exhibits no discharge.  Neck: Normal range of motion. Neck  supple. No tracheal deviation present.  Cardiovascular: Normal rate and regular rhythm.   Pulmonary/Chest: Effort normal and breath sounds normal.  Abdominal: Soft. She exhibits no distension. There is no tenderness. There is no guarding.  Musculoskeletal: She exhibits no edema.  Neurological: She is alert and oriented to person, place, and time.  Patient has general weakness which is at baseline worse in the legs. Sensation intact palpation upper lock shows bilateral. Pupils equal, extraocular muscle function intact gross visual fields intact. Neck supple. Speech in verbal responses appropriate no slurring. Patient has mild memory difficulty at times which is baseline per daughter.  Skin: Skin is warm. No rash noted.  Psychiatric:  No overt confusion, mild slow response to speech which is baseline per daughter  Nursing note and vitals reviewed.   ED Course  Procedures (including critical care time) Labs Review Labs Reviewed  BASIC METABOLIC PANEL - Abnormal; Notable for the following:    Potassium 3.4 (*)    Glucose, Bld 126 (*)    GFR calc non Af Amer 55 (*)    All other components within normal limits  CBC WITH DIFFERENTIAL/PLATELET - Abnormal; Notable for the following:    Neutrophils Relative % 80 (*)    Lymphocytes Relative 9 (*)    All other components within normal limits  TROPONIN I - Abnormal; Notable for the following:    Troponin I 0.07 (*)    All other components within normal limits  URINE CULTURE  URINALYSIS, ROUTINE W REFLEX MICROSCOPIC (NOT AT [facility])  TROPONIN I    Imaging Review Ct Head Wo Contrast  01/05/2015   CLINICAL DATA:  Fell wall walking down the hall. Altered mental status  EXAM: CT HEAD WITHOUT CONTRAST  TECHNIQUE: Contiguous axial images were obtained from the base of the skull through the vertex without intravenous contrast.  COMPARISON:  04/16/2014  FINDINGS: There is no evidence of mass effect, midline shift, or extra-axial fluid collections. There  is no evidence of a space-occupying lesion or intracranial hemorrhage. There is no evidence of a cortical-based area of acute infarction. There is generalized cerebral atrophy. There is periventricular white matter low attenuation likely secondary to microangiopathy.  The ventricles and sulci are appropriate for the patient's age. The basal cisterns are patent.  Visualized portions of the orbits are unremarkable. The visualized portions of the paranasal sinuses and mastoid air cells are unremarkable.  The osseous structures are unremarkable.  IMPRESSION: 1. No acute intracranial pathology. 2. Chronic microvascular disease and cerebral atrophy.  Electronically Signed   By: Kathreen Devoid   On: 01/05/2015 15:53     EKG Interpretation   Date/Time:  Wednesday January 05 2015 13:34:05 EDT Ventricular Rate:  95 PR Interval:    QRS Duration: 65 QT Interval:  380 QTC Calculation: 478 R Axis:   32 Text Interpretation:  Atrial fibrillation Ventricular premature complex  Abnormal R-wave progression, early transition Borderline T abnormalities,  diffuse leads Confirmed by Sofija Antwi  MD, Ramin Zoll ([id number]) on 01/05/2015 1:52:35  PM      MDM   Final diagnoses:  Fall, initial encounter  Memory loss  Syncope and collapse  Elevated troponin   Patient presents after a fall, memory loss episode. Unwitnessed, patient does not recall specific event. Fortunately patient at her baseline. Discussed broad differential with daughter and patient plan for screening blood work, urinalysis, CT head and reassessment. We'll discuss either observation on telemetry versus close outpatient follow-up pending.  Patient has no symptoms on recheck. Troponin elevated and with likely syncope [facility] with [facility].  The patients results and plan were reviewed and discussed.   Any x-rays performed were independently reviewed by myself.   Differential diagnosis were considered with the presenting  HPI.  Medications  sodium chloride 0.9 % bolus 500 mL (500 mLs Intravenous New Bag/Given 01/05/15 1433)    Filed Vitals:   01/05/15 1500 01/05/15 1515 01/05/15 1603 01/05/15 1604  BP: 121/81 107/71 124/53   Pulse: 70 71  68  Temp:      Resp: $Remo'20 11  17  'dlQTy$ SpO2: 93% 96%  96%    Final diagnoses:  Fall, initial encounter  Memory loss  Syncope and collapse  Elevated troponin    Admission/ observation were discussed with the admitting physician, patient and/or family and they are comfortable with the plan.      Elnora Morrison, MD 01/05/15 [phone]

## 2015-01-05 NOTE — ED Notes (Signed)
Report  Given to [employer] RN

## 2015-01-06 DIAGNOSIS — F22 Delusional disorders: Secondary | ICD-10-CM | POA: Diagnosis not present

## 2015-01-06 DIAGNOSIS — R7989 Other specified abnormal findings of blood chemistry: Secondary | ICD-10-CM

## 2015-01-06 DIAGNOSIS — F0391 Unspecified dementia with behavioral disturbance: Secondary | ICD-10-CM | POA: Diagnosis not present

## 2015-01-06 DIAGNOSIS — I482 Chronic atrial fibrillation: Secondary | ICD-10-CM

## 2015-01-06 DIAGNOSIS — W19XXXA Unspecified fall, initial encounter: Secondary | ICD-10-CM | POA: Diagnosis not present

## 2015-01-06 LAB — BASIC METABOLIC PANEL
Anion gap: 9 (ref 5–15)
BUN: 16 mg/dL (ref 6–20)
CO2: 28 mmol/L (ref 22–32)
Calcium: 9.1 mg/dL (ref 8.9–10.3)
Chloride: 104 mmol/L (ref 101–111)
Creatinine, Ser: 0.9 mg/dL (ref 0.44–1.00)
GFR calc Af Amer: >60 mL/min (ref >60)
GFR calc non Af Amer: 58 mL/min — ABNORMAL LOW (ref >60)
Glucose, Bld: 110 mg/dL — ABNORMAL HIGH (ref 65–99)
POTASSIUM: 3.7 mmol/L (ref 3.5–5.1)
SODIUM: 141 mmol/L (ref 135–145)

## 2015-01-06 LAB — HEPATIC FUNCTION PANEL
ALBUMIN: 3.1 g/dL — AB (ref 3.5–5.0)
ALT: 12 U/L — AB (ref 14–54)
AST: 18 U/L (ref 15–41)
Alkaline Phosphatase: 57 U/L (ref 38–126)
BILIRUBIN INDIRECT: 0.5 mg/dL (ref 0.3–0.9)
BILIRUBIN TOTAL: 0.6 mg/dL (ref 0.3–1.2)
Bilirubin, Direct: 0.1 mg/dL (ref 0.1–0.5)
TOTAL PROTEIN: 5.6 g/dL — AB (ref 6.5–8.1)

## 2015-01-06 LAB — GLUCOSE, CAPILLARY: Glucose-Capillary: 100 mg/dL — ABNORMAL HIGH (ref 65–99)

## 2015-01-06 LAB — TROPONIN I: TROPONIN I: 0.18 ng/mL — AB (ref <0.031)

## 2015-01-06 MED ORDER — RISPERIDONE 2 MG PO TBDP
2.0000 mg | ORAL_TABLET | Freq: Every day | ORAL | Status: DC
  Administered 2015-01-06: 2 mg via ORAL
  Filled 2015-01-06 (×2): qty 1

## 2015-01-06 MED ORDER — ZOLPIDEM TARTRATE 5 MG PO TABS
5.0000 mg | ORAL_TABLET | Freq: Every evening | ORAL | Status: DC | PRN
  Administered 2015-01-06: 5 mg via ORAL
  Filled 2015-01-06: qty 1

## 2015-01-06 MED ORDER — DIVALPROEX SODIUM 500 MG PO DR TAB: 500.0000 mg | DELAYED_RELEASE_TABLET | ORAL | Status: DC

## 2015-01-06 MED ORDER — BENZTROPINE MESYLATE 0.5 MG PO TABS
0.5000 mg | ORAL_TABLET | Freq: Two times a day (BID) | ORAL | Status: DC
  Administered 2015-01-06 – 2015-01-07 (×2): 0.5 mg via ORAL
  Filled 2015-01-06 (×3): qty 1

## 2015-01-06 MED ORDER — VALPROATE SODIUM 500 MG/5ML IV SOLN
500.0000 mg | Freq: Two times a day (BID) | INTRAVENOUS | Status: DC
  Filled 2015-01-06 (×2): qty 5

## 2015-01-06 MED ORDER — DIVALPROEX SODIUM 500 MG PO DR TAB
500.0000 mg | DELAYED_RELEASE_TABLET | ORAL | Status: DC
  Filled 2015-01-06: qty 1

## 2015-01-06 NOTE — Progress Notes (Signed)
Occupational Therapy Evaluation Patient Details Name: Monica Strong MRN: [id number] DOB: 1932/04/24 Today's Date: 01/06/2015    History of Present Illness Monica Strong is a 81 y.o. female, with atrial fibrillation, diastolic heart failure, dementia and a history of syncope. She was brought to the emergency department 01/05/15 from assisted living after an unwitnessed fall/possible syncopal episode.   Clinical Impression   Patient presenting with deconditioning and decrease independence with ADLs. Patient mod I with mobility and min assist with showering PTA. Patient currently requires min>max assist with ADLs. Patient will benefit from acute OT to increase overall independence in the areas of ADLs, functional mobility, and overall safety in order to safely discharge to venue listed below.     Follow Up Recommendations  SNF;Supervision/Assistance - 24 hour (UNLESS pt can have 24/7 supervision/assist at ALF)    Equipment Recommendations  Other (comment) (TBD)    Recommendations for Other Services  None at this time   Precautions / Restrictions Precautions Precautions: Fall Restrictions Weight Bearing Restrictions: No    Mobility Bed Mobility General bed mobility comments: Pt found seated EOB  Transfers Overall transfer level: Needs assistance Equipment used: Rolling walker (2 wheeled) Transfers: Sit to/from Stand Sit to Stand: Max assist         General transfer comment: Pt stood X2. Second time pt attempted to take a couple steps forward/backward, pt with increased fatigue. Cues for hand placment, sequencing, technique     Balance Overall balance assessment: Needs assistance Sitting-balance support: Bilateral upper extremity supported;Feet supported Sitting balance-Leahy Scale: Good     Standing balance support: Bilateral upper extremity supported Standing balance-Leahy Scale: Poor    ADL Overall ADL's : Needs assistance/impaired General ADL Comments: Pt min assist  for UB ADLs, max assist for LB ADLs, max assist for sit<>stands from EOB. Pt with decreased cognition, chart reports h/o dementia; pt kept stating things are different. Pt unsafe to live by herself, she will need 24/7 supervision/assistance post acute d/c.     Pertinent Vitals/Pain Pain Assessment: 0-10 Pain Score: 7  Pain Location: right knee Pain Descriptors / Indicators: Aching Pain Intervention(s): Monitored during session     Hand Dominance Right   Extremity/Trunk Assessment Upper Extremity Assessment Upper Extremity Assessment: Generalized weakness   Lower Extremity Assessment Lower Extremity Assessment: Defer to PT evaluation   Cervical / Trunk Assessment Cervical / Trunk Assessment: Kyphotic   Communication Communication Communication: No difficulties   Cognition Arousal/Alertness: Awake/alert Behavior During Therapy: WFL for tasks assessed/performed Overall Cognitive Status: History of cognitive impairments - at baseline (no family present)       Monica: Decreased short-term Monica              Home Living Family/patient expects to be discharged to:: Assisted living ([facility]) Home Equipment: Monica Strong - single point;Walker - 4 wheels   Additional Comments: Pt uses       Prior Functioning/Environment Level of Independence: Needs assistance  Gait / Transfers Assistance Needed: Pt reports she used RW for mobiltiy ADL's / Homemaking Assistance Needed: Pt reports that her daughter assists with bathing ("she washes me"). Pt reports she dresses herself. Communication / Swallowing Assistance Needed: independent Comments: Pt with history of dementia    OT Diagnosis: Generalized weakness;Acute pain   OT Problem List: Decreased strength;Decreased activity tolerance;Impaired balance (sitting and/or standing);Pain;Decreased knowledge of use of DME or AE;Decreased knowledge of precautions;Decreased safety awareness   OT Treatment/Interventions: Self-care/ADL  training;Energy conservation;DME and/or AE instruction;Therapeutic activities;Patient/family education;Balance training    OT  Goals(Current goals can be found in the care plan section) Acute Rehab OT Goals Patient Stated Goal: get stronger OT Goal Formulation: With patient Time For Goal Achievement: 01/20/15 Potential to Achieve Goals: Good ADL Goals Pt Will Perform Grooming: with supervision;standing Pt Will Perform Upper Body Bathing: with supervision;sitting Pt Will Perform Upper Body Dressing: with supervision;sitting Pt Will Transfer to Toilet: with supervision;ambulating;bedside commode Pt Will Perform Toileting - Clothing Manipulation and hygiene: with supervision;sit to/from stand Additional ADL Goal #1: Pt will perform sit<>stand with supervision in prep for ADLs and functional mobility  OT Frequency: Min 2X/week   Barriers to D/C: Decreased caregiver support   End of Session Equipment Utilized During Treatment: Gait belt;Rolling walker  Activity Tolerance: Patient tolerated treatment well Patient left: in bed;with call bell/phone within reach;with bed alarm set (seated EOB)   Time: 1135-1200 OT Time Calculation (min): 25 min Charges:  OT General Charges $OT Visit: 1 Procedure OT Evaluation $Initial OT Evaluation Tier I: 1 Procedure OT Treatments $Self Care/Home Management : 8-22 mins G-Codes: OT G-codes **NOT FOR INPATIENT CLASS** Functional Limitation: Self care Self Care Current Status (I3128): At least 60 percent but less than 80 percent impaired, limited or restricted Self Care Goal Status (F1886): At least 1 percent but less than 20 percent impaired, limited or restricted  Monica Strong , MS, OTR/L, CLT Pager: [phone]  01/06/2015, 1:07 PM

## 2015-01-06 NOTE — Progress Notes (Signed)
Patient working with OT, Heart rate increased to 120-140's.

## 2015-01-06 NOTE — Progress Notes (Addendum)
NP Baltazar Najjar notified of 0.22 troponin. No new orders received. Will continue to monitor pt closely.  59; NP notified 3rd troponin result 0.18. No new orders received. Will continue to monitor pt closely.

## 2015-01-06 NOTE — Clinical Social Work Note (Signed)
Clinical Social Work Assessment  Patient Details  Name: Monica Strong MRN: [id number] Date of Birth: 09/02/1931  Date of referral:  01/06/15               Reason for consult:  Facility Placement                Permission sought to share information with:    Permission granted to share information::  Yes, Verbal Permission Granted  Name::        Agency::  [facility]  Relationship::     Contact Information:     Housing/Transportation Living arrangements for the past 2 months:  [place] of Information:  Facility, Adult Children Patient Interpreter Needed:  None Criminal Activity/Legal Involvement Pertinent to Current Situation/Hospitalization:  No - Comment as needed Significant Relationships:  Adult Children Lives with:  Facility Resident Do you feel safe going back to the place where you live?  Yes Need for family participation in patient care:  Yes (Comment)  Care giving concerns:  Pt admitted from ALF but with increased deconditioning recommendation is for SNF   Social Worker assessment / plan:  CSW spoke with pt daughter outside of pt room as pt was receiving care at the time. Pt daughter aware of recommendation and agreeable to which ever plan is safest for pt. CSW spoke with [facility] and was notified that they can increase care for pt as long as she is not total dependent for ADLs and mobility. Facility feels they can manage pt at current level. This message was relayed to pt daughter and she is happy pt can return there and feels this will be best for pt mentality and cognition. CSW did inform pt daughter that [place] will review clinicals on day of dc and plan could still potentially change. Pt daughter understanding and agreeable for [facility] SNF referral to be made if this happens.  CSW will fax dc paperwork to [phone] ATTN: [place].  Employment status:  Retired Nurse, adult PT Recommendations:   [place] / Referral to community resources:     Patient/Family's Response to care: Pt daughter glad pt should be able to return to ALF with increased supervision/assistance  Patient/Family's Understanding of and Emotional Response to Diagnosis, Current Treatment, and Prognosis:  Pt daughter has a good understanding of pt condition. Pt daughter did express that pt hospitalization has caused her to worry more and be anxious, but overall appropriate emotional response.  Emotional Assessment Appearance:  Appears stated age Attitude/Demeanor/Rapport:  Unable to Assess Affect (typically observed):  Unable to Assess Orientation:  Oriented to Self, Oriented to Place, Oriented to Situation Alcohol / Substance use:  Not Applicable Psych involvement (Current and /or in the community):  No (Comment)  Discharge Needs  Concerns to be addressed:  Discharge Planning Concerns Readmission within the last 30 days:  No Current discharge risk:  Dependent with Mobility Barriers to Discharge:  Continued Medical Work up  [employer], [place]

## 2015-01-06 NOTE — Progress Notes (Signed)
[facility] PROGRESS NOTE  Assessment/Plan: Recurrent Unwitnessed fall versus syncope and collapse: - 2-D echo done in 2050 showed aortic stenosis with an EF of 65%. - CT scan of the head showed no acute findings, EKG shows A. fib rate control no ST segment abnormalities. - TSH is 0.9. No events on telemetry. Physical therapy consult pending. - I am concerned about patient being overmedicated, which may be contributing to her episodes of recurrent falls I will decrease his dose of Risperdal stopped the Seroquel and continued the Depakote. - Consult psychiatry, get physical therapy. - Going back to records her EEG does not show any signs of seizures, she was on Depakote as a mood stabilizing agent. - Which brings up the question whether she should be on oral anticoagulation due to her recurrent falls. - We'll have to consult palliative depending on psyq eval   Elevated troponin: - Patient denies any chest pain, she is on aspirin and statins. - Troponins are trending down.   Chronic atrial fibrillation: Rate control on diltiazem and Xarelto. We'll have to discuss with the family the risk and benefits it fits him being on Xarelto with these recurrent falls.  Right knee pain: Probably due to falls, continue pain control.  Abdominal pain: Check LFTs tolerating diet abdominal x-ray was negative for any acute findings.  Dementia with paranoid delusions and psychosis: - Continue risperidone.  Morbid obesity  obstructive sleep apnea   Code Status: DNR Family Communication: none  Disposition Plan: inpatient   Consultants:  Psyq  Procedures:  Ct head  Antibiotics:  None  HPI/Subjective: She has no complaints  Objective: Filed Vitals:   01/05/15 2044 01/05/15 2339 01/06/15 0522 01/06/15 0848  BP: 104/75 116/72 118/73 107/65  Pulse: 70 81 81 79  Temp: 97.8 F (36.6 C) 98.6 F (37 C) 98.7 F (37.1 C) 97.6 F (36.4 C)  TempSrc: Oral Oral Oral Oral  Resp:  16 18 18    Weight:   97.297 kg (214 lb 8 oz)   SpO2: 97% 93% 94% 92%    Intake/Output Summary (Last 24 hours) at 01/06/15 0949 Last data filed at 01/06/15 0816  Gross per 24 hour  Intake    660 ml  Output     50 ml  Net    610 ml   Filed Weights   01/06/15 0522  Weight: 97.297 kg (214 lb 8 oz)    Exam:  General: Alert, awake, oriented x3, in no acute distress.  HEENT: No bruits, no goiter.  Heart: Regular rate and rhythm. Lungs: Good air movement, clear Abdomen: Soft, nontender, nondistended, positive bowel sounds.  Neuro: Grossly intact, nonfocal.   Data Reviewed: Basic Metabolic Panel:  Recent Labs Lab 01/05/15 1417 01/05/15 2005 01/06/15 0051  NA 141  --  141  K 3.4*  --  3.7  CL 105  --  104  CO2 27  --  28  GLUCOSE 126*  --  110*  BUN 16  --  16  CREATININE 0.94  --  0.90  CALCIUM 9.1  --  9.1  MG  --  1.8  --    Liver Function Tests: No results for input(s): AST, ALT, ALKPHOS, BILITOT, PROT, ALBUMIN in the last 168 hours. No results for input(s): LIPASE, AMYLASE in the last 168 hours. No results for input(s): AMMONIA in the last 168 hours. CBC:  Recent Labs Lab 01/05/15 1417  WBC 7.0  NEUTROABS 5.6  HGB 12.7  HCT 38.6  MCV 99.7  PLT 155   Cardiac Enzymes:  Recent Labs Lab 01/05/15 1417 01/05/15 2005 01/06/15 0051  TROPONINI 0.07* 0.22* 0.18*   BNP (last 3 results) No results for input(s): BNP in the last 8760 hours.  ProBNP (last 3 results) No results for input(s): PROBNP in the last 8760 hours.  CBG:  Recent Labs Lab 01/06/15 0534  GLUCAP 100*    No results found for this or any previous visit (from the past 240 hour(s)).   Studies: Ct Head Wo Contrast  01/05/2015   CLINICAL DATA:  Fell wall walking down the hall. Altered mental status  EXAM: CT HEAD WITHOUT CONTRAST  TECHNIQUE: Contiguous axial images were obtained from the base of the skull through the vertex without intravenous contrast.  COMPARISON:  04/16/2014   FINDINGS: There is no evidence of mass effect, midline shift, or extra-axial fluid collections. There is no evidence of a space-occupying lesion or intracranial hemorrhage. There is no evidence of a cortical-based area of acute infarction. There is generalized cerebral atrophy. There is periventricular white matter low attenuation likely secondary to microangiopathy.  The ventricles and sulci are appropriate for the patient's age. The basal cisterns are patent.  Visualized portions of the orbits are unremarkable. The visualized portions of the paranasal sinuses and mastoid air cells are unremarkable.  The osseous structures are unremarkable.  IMPRESSION: 1. No acute intracranial pathology. 2. Chronic microvascular disease and cerebral atrophy.   Electronically Signed   By: Kathreen Devoid   On: 01/05/2015 15:53   Dg Knee Complete 4 Views Right  01/05/2015   CLINICAL DATA:  Fall wall walking down the hallway today. Anterior right knee pain. Initial encounter.  EXAM: RIGHT KNEE - COMPLETE 4+ VIEW  COMPARISON:  None.  FINDINGS: Total knee arthroplasty is seen with all 3 components in expected position. There is no evidence of fracture, dislocation, or joint effusion. No other focal bone lesion identified. Generalized osteopenia noted. Soft tissues are unremarkable.  IMPRESSION: No acute findings.  Total knee arthroplasty and osteopenia.   Electronically Signed   By: Earle Gell M.D.   On: 01/05/2015 18:07   Dg Abd 2 Views  01/05/2015   CLINICAL DATA:  Fall.  Left upper quadrant pain.  EXAM: ABDOMEN - 2 VIEW  COMPARISON:  04/23/2014  FINDINGS: The bowel gas pattern is normal. There is no evidence of free air. No radio-opaque calculi or other significant radiographic abnormality is seen.  IMPRESSION: Negative.   Electronically Signed   By: Kerby Moors M.D.   On: 01/05/2015 18:08    Scheduled Meds: . aspirin  81 mg Oral Daily  . diltiazem  180 mg Oral Daily  . divalproex  500 mg Oral 2 times per day  .  furosemide  40 mg Oral Daily  . multivitamin  5 mL Oral Daily  . MUSCLE RUB   Topical BID  . pantoprazole  40 mg Oral Daily  . polyethylene glycol  17 g Oral Daily  . risperiDONE  3 mg Oral QHS  . rivaroxaban  20 mg Oral Daily  . sodium chloride  3 mL Intravenous Q12H   Continuous Infusions:   Time Spent: 25 min   Charlynne Cousins  [facility] Pager [phone]. If 7PM-7AM, please contact night-coverage at [email or web address], password [facility] 01/06/2015, 9:49 AM  LOS: 1 day

## 2015-01-06 NOTE — Consult Note (Signed)
[place] [facility] Consult   Reason for Consult:  Dementia and psychosis Referring Physician:  Dr. Venetia Constable Patient Identification: Monica Strong MRN:  [id number] Principal Diagnosis: Fall Diagnosis:   Patient Active Problem List   Diagnosis Date Noted  . Elevated troponin [R79.89] 01/05/2015  . Abdominal pain [R10.9] 01/05/2015  . Knee pain, right [M25.561] 01/05/2015  . Dementia [F03.90] 01/05/2015  . Fall [W19.XXXA] 01/05/2015  . Hilar adenopathy [R59.9] 05/15/2014  . Faintness [R55]   . PE (pulmonary embolism) [I26.99]   . Thrombocytopenia [D69.6] 04/15/2014  . LOC (loss of consciousness) [R40.4] 04/15/2014  . Transient loss of consciousness [R55] 04/05/2014  . Syncope [R55] 04/05/2014  . History of infection due to ESBL Escherichia coli [Z86.19] 02/23/2014  . Demand ischemia [I24.8] 02/23/2014  . Psychosis [F29] 02/22/2014  . Atrial fibrillation [I48.91] 02/17/2014  . Altered mental status [R41.82] 02/17/2014  . Paranoia [F22] 02/17/2014  . Troponin level elevated [R79.89] 02/17/2014  . Unspecified vitamin D deficiency [E55.9] 02/19/2013  . Osteopenia [M85.80] 02/19/2013  . Morbid obesity [E66.01] 06/14/2010  . Obstructive sleep apnea [G47.33] 06/14/2010  . GERD [K21.9] 06/14/2010  . DIVERTICULOSIS OF COLON [K57.30] 06/14/2010  . RECTAL BLEEDING [K62.5] 06/14/2010  . COLONIC POLYPS, HX OF [Z86.010] 06/14/2010    Total Time spent with patient: 45 minutes  Subjective:   Monica Strong is a 81 y.o. female patient admitted with frequent falls.  HPI:  Monica Strong is a 81 y.o. Female seen face-to-face for the psychiatric consultation and evaluation of frequent falls, shuffling gait and history of paranoid delusions, agitation and aggressive behaviors. Case discussed with the psychiatric social service, staff RN and physical therapist regarding recent deterioration of physical activity and frequent falls needed close supervision. Case discussed with the patient  daughter Monica Strong who is in the waiting area. Patient daughter is concerned about her both mental and physical deterioration. Patient has been placed in a nursing home secondary to increased symptoms of paranoid psychosis, agitation and aggressive behaviors. As per Monica Strong patient was able to walk with walker or cane before came to the hospital now she needed close supervision and physical therapy. She is also highly frustrated about not able to find the geriatric psychiatry local area because everyone saying she has severe long waiting period to see a psychiatrist. Patient daughter is not elevated off who is providing her psychiatric medications prescriptions since he was discharged from the [facility] psychiatric hospitalization. Patient denies current symptoms of suicidal/homicidal ideation intention or plans.  HPI Elements:   Location:  Psychosis. Quality:  Poor. Severity:  Frequent falls. Timing:  Psychiatric medication. Duration:  Few months. Context:  Psychosocial stresses out-of-home placement unable to find geriatric psychiatrist and community.  Past Medical History:  Past Medical History  Diagnosis Date  . GERD (gastroesophageal reflux disease)   . H/O hiatal hernia   . Pulmonary nodules BENIGN--  MONITORED BY PCP DR READE--  ASYMPTOMATIC     LAST CHEST CT 08-17-2011  . Heart murmur MILD -- ASYMPTOMATIC  . Urge urinary incontinence   . Sinus drainage   . Neuromuscular disorder     rt hand numbness  . Dysuria     has urethral bump  . Ureteral stent retained   . Paranoia   . Obesity   . Chest pain     a. Normal stress test 08/2008 and CTA neg for PE at that time.  Jola [facility] ear dysfunction     a. Prior h/o vertigo.  Marland Kitchen History of infection due  to ESBL Escherichia coli 02/23/2014  . Hypertension   . Atrial fibrillation   . Pulmonary embolism ~ 05/2014  . OSA (obstructive sleep apnea)     not using cpap (01/05/2015)  . Arthritis     "hands" (01/05/2015)  . Renal stones left  .  Psychotic episode     "was hospitalized at [facility] 07/2014; was having hallucinations; on RX now" (01/05/2015)  . Poor short term memory     Past Surgical History  Procedure Laterality Date  . Total knee arthroplasty Left 10-17-2009  DR Wynelle Link  . Total knee arthroplasty Right 04-25-2009  . Tonsillectomy    . Cardiovascular stress test  03-13-2007  dr Tressia Miners turner    NORMAL LV SIZE SYSTOLIC FUCTION/ NO ISCHEMIA/ EF 85%  . Transthoracic echocardiogram  08-18-2008    NORMAL LVSF/ EF 55-60%/ MILD MITRAL REGURG .  Marland Kitchen Removal breast cyst, benign  1960's  . Vaginal hysterectomy  1970  . Cataract extraction w/ intraocular lens  implant, bilateral    . Blepharoplasty Bilateral   . Cystoscopy with retrograde pyelogram, ureteroscopy and stent placement  05/14/2012    Procedure: [place], URETEROSCOPY AND STENT PLACEMENT;  Surgeon: Alexis Frock, MD;  Location: [facility];  Service: Urology;  Laterality: Left;  1ST STAGE LEFT URETEROSCOPY, LEFT RETROGRADE, DIGITAL URETEROSCOPY,  LEFT STONE REMOVAL WITH ESCAPE BASKET,  STENT PLACEMENt   . Holmium laser application  35/10/7320    Procedure: HOLMIUM LASER APPLICATION;  Surgeon: Alexis Frock, MD;  Location: [facility];  Service: Urology;  Laterality: Left;  . Holmium laser application  0/07/5425    Procedure: HOLMIUM LASER APPLICATION;  Surgeon: Alexis Frock, MD;  Location: [facility];  Service: Urology;  Laterality: Left;  . Cystoscopy/retrograde/ureteroscopy/stone extraction with basket  06/13/2012    Procedure: CYSTOSCOPY/RETROGRADE/URETEROSCOPY/STONE EXTRACTION WITH BASKET;  Surgeon: Alexis Frock, MD;  Location: [facility];  Service: Urology;  Laterality: Left;  . Cystoscopy w/ ureteral stent placement  06/13/2012    Procedure: CYSTOSCOPY WITH STENT REPLACEMENT;  Surgeon: Alexis Frock, MD;  Location: [facility];  Service: Urology;   Laterality: Left;  . Appendectomy      PT STATES PER XRAY SMALL AMOUNT OF APPENDIX LEFT  . Cystoscopy w/ ureteral stent removal  07/11/2012    Procedure: CYSTOSCOPY WITH STENT REMOVAL;  Surgeon: Alexis Frock, MD;  Location: [facility];  Service: Urology;  Laterality: Left;  . Cystoscopy w/ retrogrades  07/11/2012    Procedure: CYSTOSCOPY WITH RETROGRADE PYELOGRAM;  Surgeon: Alexis Frock, MD;  Location: [facility];  Service: Urology;  Laterality: Left;  left third stage ureteroscopystone manipulation  . Ureteroscopy  07/11/2012    Procedure: URETEROSCOPY;  Surgeon: Alexis Frock, MD;  Location: [facility];  Service: Urology;  Laterality: Left;  . Joint replacement     Family History:  Family History  Problem Relation Age of Onset  . Other      Pt unaware due to psych condition  . Heart attack Father    Social History:  History  Alcohol Use No     History  Drug Use No    History   Social History  . Marital Status: Divorced    Spouse Name: N/A  . Number of Children: N/A  . Years of Education: N/A   Social History Main Topics  . Smoking status: Never Smoker   . Smokeless tobacco: Never Used  . Alcohol Use: No  .  Drug Use: No  . Sexual Activity: Not on file   Other Topics Concern  . None   Social History Narrative   Additional Social History:                          Allergies:   Allergies  Allergen Reactions  . Diphenhydramine Nausea Only    Sick to stomach  . Flagyl [Metronidazole] Other (See Comments)    HALLUCINATIONS  . Hydrocodone Other (See Comments)    HALLUCINATIONS  . Meperidine Hcl Nausea And Vomiting  . Penicillins Hives  . Sulfonamide Derivatives Hives    Labs:  Results for orders placed or performed during the hospital encounter of 01/05/15 (from the past 48 hour(s))  Basic metabolic panel     Status: Abnormal   Collection Time: 01/05/15  2:17 PM  Result Value Ref Range   Sodium 141  135 - 145 mmol/L   Potassium 3.4 (L) 3.5 - 5.1 mmol/L   Chloride 105 101 - 111 mmol/L   CO2 27 22 - 32 mmol/L   Glucose, Bld 126 (H) 65 - 99 mg/dL   BUN 16 6 - 20 mg/dL   Creatinine, Ser 8.10 0.44 - 1.00 mg/dL   Calcium 9.1 8.9 - 67.1 mg/dL   GFR calc non Af Amer 55 (L) >60 mL/min   GFR calc Af Amer >60 >60 mL/min    Comment: (NOTE) The eGFR has been calculated using the CKD EPI equation. This calculation has not been validated in all clinical situations. eGFR's persistently <60 mL/min signify possible Chronic Kidney Disease.    Anion gap 9 5 - 15  CBC with Differential/Platelet     Status: Abnormal   Collection Time: 01/05/15  2:17 PM  Result Value Ref Range   WBC 7.0 4.0 - 10.5 K/uL   RBC 3.87 3.87 - 5.11 MIL/uL   Hemoglobin 12.7 12.0 - 15.0 g/dL   HCT 93.4 29.9 - 58.3 %   MCV 99.7 78.0 - 100.0 fL   MCH 32.8 26.0 - 34.0 pg   MCHC 32.9 30.0 - 36.0 g/dL   RDW 57.6 18.9 - 86.9 %   Platelets 155 150 - 400 K/uL   Neutrophils Relative % 80 (H) 43 - 77 %   Neutro Abs 5.6 1.7 - 7.7 K/uL   Lymphocytes Relative 9 (L) 12 - 46 %   Lymphs Abs 0.7 0.7 - 4.0 K/uL   Monocytes Relative 11 3 - 12 %   Monocytes Absolute 0.8 0.1 - 1.0 K/uL   Eosinophils Relative 0 0 - 5 %   Eosinophils Absolute 0.0 0.0 - 0.7 K/uL   Basophils Relative 0 0 - 1 %   Basophils Absolute 0.0 0.0 - 0.1 K/uL  Troponin I     Status: Abnormal   Collection Time: 01/05/15  2:17 PM  Result Value Ref Range   Troponin I 0.07 (H) <0.031 ng/mL    Comment:        PERSISTENTLY INCREASED TROPONIN VALUES IN THE RANGE OF 0.04-0.49 ng/mL CAN BE SEEN IN:       -UNSTABLE ANGINA       -CONGESTIVE HEART FAILURE       -MYOCARDITIS       -CHEST TRAUMA       -ARRYHTHMIAS       -LATE PRESENTING MYOCARDIAL INFARCTION       -COPD   CLINICAL FOLLOW-UP RECOMMENDED.   Urinalysis, Routine w reflex microscopic (not at [facility])  Status: None   Collection Time: 01/05/15  3:39 PM  Result Value Ref Range   Color, Urine YELLOW YELLOW    APPearance CLEAR CLEAR   Specific Gravity, Urine 1.009 1.005 - 1.030   pH 7.5 5.0 - 8.0   Glucose, UA NEGATIVE NEGATIVE mg/dL   Hgb urine dipstick NEGATIVE NEGATIVE   Bilirubin Urine NEGATIVE NEGATIVE   Ketones, ur NEGATIVE NEGATIVE mg/dL   Protein, ur NEGATIVE NEGATIVE mg/dL   Urobilinogen, UA 0.2 0.0 - 1.0 mg/dL   Nitrite NEGATIVE NEGATIVE   Leukocytes, UA NEGATIVE NEGATIVE    Comment: MICROSCOPIC NOT DONE ON URINES WITH NEGATIVE PROTEIN, BLOOD, LEUKOCYTES, NITRITE, OR GLUCOSE <1000 mg/dL.  Magnesium     Status: None   Collection Time: 01/05/15  8:05 PM  Result Value Ref Range   Magnesium 1.8 1.7 - 2.4 mg/dL  Troponin I     Status: Abnormal   Collection Time: 01/05/15  8:05 PM  Result Value Ref Range   Troponin I 0.22 (H) <0.031 ng/mL    Comment:        PERSISTENTLY INCREASED TROPONIN VALUES IN THE RANGE OF 0.04-0.49 ng/mL CAN BE SEEN IN:       -UNSTABLE ANGINA       -CONGESTIVE HEART FAILURE       -MYOCARDITIS       -CHEST TRAUMA       -ARRYHTHMIAS       -LATE PRESENTING MYOCARDIAL INFARCTION       -COPD   CLINICAL FOLLOW-UP RECOMMENDED.   TSH     Status: None   Collection Time: 01/05/15  8:05 PM  Result Value Ref Range   TSH 0.914 0.350 - 4.500 uIU/mL  Valproic acid level     Status: Abnormal   Collection Time: 01/05/15  8:05 PM  Result Value Ref Range   Valproic Acid Lvl <10 (L) 50.0 - 100.0 ug/mL    Comment: RESULTS CONFIRMED BY MANUAL DILUTION  Troponin I     Status: Abnormal   Collection Time: 01/06/15 12:51 AM  Result Value Ref Range   Troponin I 0.18 (H) <0.031 ng/mL    Comment:        PERSISTENTLY INCREASED TROPONIN VALUES IN THE RANGE OF 0.04-0.49 ng/mL CAN BE SEEN IN:       -UNSTABLE ANGINA       -CONGESTIVE HEART FAILURE       -MYOCARDITIS       -CHEST TRAUMA       -ARRYHTHMIAS       -LATE PRESENTING MYOCARDIAL INFARCTION       -COPD   CLINICAL FOLLOW-UP RECOMMENDED.   Basic metabolic panel     Status: Abnormal   Collection Time: 01/06/15  12:51 AM  Result Value Ref Range   Sodium 141 135 - 145 mmol/L   Potassium 3.7 3.5 - 5.1 mmol/L   Chloride 104 101 - 111 mmol/L   CO2 28 22 - 32 mmol/L   Glucose, Bld 110 (H) 65 - 99 mg/dL   BUN 16 6 - 20 mg/dL   Creatinine, Ser 0.90 0.44 - 1.00 mg/dL   Calcium 9.1 8.9 - 10.3 mg/dL   GFR calc non Af Amer 58 (L) >60 mL/min   GFR calc Af Amer >60 >60 mL/min    Comment: (NOTE) The eGFR has been calculated using the CKD EPI equation. This calculation has not been validated in all clinical situations. eGFR's persistently <60 mL/min signify possible Chronic Kidney Disease.    Anion gap 9  5 - 15  Glucose, capillary     Status: Abnormal   Collection Time: 01/06/15  5:34 AM  Result Value Ref Range   Glucose-Capillary 100 (H) 65 - 99 mg/dL    Vitals: Blood pressure 107/65, pulse 79, temperature 97.6 F (36.4 C), temperature source Oral, resp. rate 18, weight 97.297 kg (214 lb 8 oz), SpO2 92 %.  Risk to Self: Is patient at risk for suicide?: No Risk to Others:   Prior Inpatient Therapy:   Prior Outpatient Therapy:    Current Facility-Administered Medications  Medication Dose Route Frequency Provider Last Rate Last Dose  . acetaminophen (TYLENOL) tablet 650 mg  650 mg Oral Q6H PRN Melton Alar, PA-C   650 mg at 01/05/15 2310  . alum & mag hydroxide-simeth (MAALOX/MYLANTA) 200-200-20 MG/5ML suspension 30 mL  30 mL Oral Q6H PRN Melton Alar, PA-C      . aspirin chewable tablet 81 mg  81 mg Oral Daily Marianne L York, PA-C      . diltiazem (DILACOR XR) 24 hr capsule 180 mg  180 mg Oral Daily Marianne L York, PA-C      . divalproex (DEPAKOTE) DR tablet 500 mg  500 mg Oral 2 times per day Harvel Quale, [facility]      . docusate sodium (COLACE) capsule 100 mg  100 mg Oral Daily PRN Melton Alar, PA-C   100 mg at 01/05/15 2310  . furosemide (LASIX) tablet 40 mg  40 mg Oral Daily Melton Alar, PA-C      . multivitamin liquid 5 mL  5 mL Oral Daily Tanna Savoy Stinson, DO   5 mL at 01/05/15  2249  . MUSCLE RUB CREA   Topical BID Tanna Savoy Stinson, DO      . ondansetron [facility]) tablet 4 mg  4 mg Oral Q6H PRN Melton Alar, PA-C       Or  . ondansetron [facility]) injection 4 mg  4 mg Intravenous Q6H PRN Melton Alar, PA-C      . pantoprazole (PROTONIX) EC tablet 40 mg  40 mg Oral Daily Marianne L York, PA-C      . polyethylene glycol (MIRALAX / GLYCOLAX) packet 17 g  17 g Oral Daily Melton Alar, PA-C   0 g at 01/05/15 1954  . risperiDONE (RISPERDAL M-TABS) disintegrating tablet 2 mg  2 mg Oral QHS Charlynne Cousins, MD      . rivaroxaban [facility]) tablet 20 mg  20 mg Oral Daily Melton Alar, PA-C      . sodium chloride 0.9 % injection 3 mL  3 mL Intravenous Q12H Melton Alar, PA-C   3 mL at 01/05/15 2250  . zolpidem (AMBIEN) tablet 5 mg  5 mg Oral QHS PRN Gardiner Barefoot, NP   5 mg at 01/06/15 0130    Musculoskeletal: Strength & Muscle Tone: decreased Gait & Station: shuffle, Need assistance Patient leans: Front  Psychiatric Specialty Exam: Physical Exam as per history and physical   ROS frequent falls, atrial fibrillation, knee pain, memory deficits, decreased mobility with short steps and needed assistance but denied nausea, vomiting, chest pain and shortness of breath No Fever-chills, No Headache, No changes with Vision or hearing, reports vertigo No problems swallowing food or Liquids, No Chest pain, Cough or Shortness of Breath, No Abdominal pain, No Nausea or Vommitting, Bowel movements are regular, No Blood in stool or Urine, No dysuria, No new skin rashes or bruises, No new  joints pains-aches,  No new weakness, tingling, numbness in any extremity, No recent weight gain or loss, No polyuria, polydypsia or polyphagia,   A full 10 point Review of Systems was done, except as stated above, all other Review of Systems were negative.  Blood pressure 107/65, pulse 79, temperature 97.6 F (36.4 C), temperature source Oral, resp. rate 18, weight  97.297 kg (214 lb 8 oz), SpO2 92 %.Body mass index is 41.89 kg/(m^2).  General Appearance: Casual  Eye Contact::  Good  Speech:  Clear and Coherent  Volume:  Normal  Mood:  Euthymic  Affect:  Appropriate and Congruent  Thought Process:  Coherent and Goal Directed  Orientation:  Full (Time, Place, and Person)  Thought Content:  WDL  Suicidal Thoughts:  No  Homicidal Thoughts:  No  Memory:  Immediate;   Fair Recent;   Fair  Judgement:  Intact  Insight:  Fair  Psychomotor Activity:  Shuffling Gait  Concentration:  Fair  Recall:  Good  Fund of Knowledge:Good  Language: Good  Akathisia:  Negative  Handed:  Right  AIMS (if indicated):     Assets:  Communication Skills Desire for Improvement Financial Resources/Insurance Housing Intimacy Leisure Time Resilience Social Support Transportation  ADL's:  Impaired  Cognition: Impaired,  Mild  Sleep:      Medical Decision Making: Review of Psycho-Social Stressors (1), Review or order clinical lab tests (1), New Problem, with no additional work-up planned (3), Review of Last Therapy Session (1), Review or order medicine tests (1), Review of Medication Regimen & Side Effects (2) and Review of New Medication or Change in Dosage (2)  Treatment Plan Summary[JUNK] Ppatient presented with frequent falls and parkinsonism symptoms needing medication adjustment at this time. Patient has no safety concerns in terms of suicidal/homicidal ideation intention or plans. Patient has no evidence of psychosis.  Daily contact with patient to assess and evaluate symptoms and progress in treatment and Medication management  Plan:   Discontinue Depakote secondary to increased sedation  Continue risperidone 2 mg at bedtime for psychosis We start benztropine 0.5 mg twice daily for EPS Patient does not meet criteria for psychiatric inpatient admission. Supportive therapy provided about ongoing stressors.  Refer to the psychiatric social service regarding  outpatient geriatric psychiatric referrals   Disposition: Patient may discharged to [facility] when medically stable.   Tevis Conger,JANARDHAHA R. 01/06/2015 10:29 AM

## 2015-01-06 NOTE — Evaluation (Signed)
Physical Therapy Evaluation Patient Details Name: AMIYA ESCAMILLA MRN: [id number] DOB: [birth year] Today's Date: 01/06/2015   History of Present Illness  Caili Escalera is a 81 y.o. female, with atrial fibrillation, diastolic heart failure, dementia and a history of syncope. She was brought to the emergency department 01/05/15 from assisted living after an unwitnessed fall/possible syncopal episode.  Clinical Impression  Patient is eager to participate in PT, stating she is tired of sitting in bed and wants to get up. Orthostatics were checked, and HR (max observed 135 during ambulation, >130 only briefly, range 110's-130's) was monitored while patient ambulated to the door and back to the recliner. Patient was severely limited by fatigue, and gait speed was very slow. Patient reported being much more independent and functional before her admittance, which she could not recall the day of.  She will benefit from 24/7 supervision for mobility/OOB and continued therapy to address high fatigue levels, ambulation limitations and decreased transfers.    Follow Up Recommendations SNF;Supervision/Assistance - 24 hour    Equipment Recommendations  Rolling walker with 5" wheels    Recommendations for Other Services       Precautions / Restrictions Precautions Precautions: Fall Restrictions Weight Bearing Restrictions: No      Mobility  Bed Mobility Overal bed mobility: Needs Assistance Bed Mobility: Supine to Sit     Supine to sit: Mod assist;HOB elevated     General bed mobility comments: Pt required Mod assist to pull trunk upright and to scoot to EOB via upper body pulling  Transfers Overall transfer level: Needs assistance Equipment used: Rolling walker (2 wheeled) Transfers: Sit to/from Stand Sit to Stand: Max assist;From elevated surface         General transfer comment: Stand to sit with modified independence for safety cues. Sit to stand max assist via gait belt around waist  for standing due to LE weakness.  Ambulation/Gait Ambulation/Gait assistance: Min guard;+2 safety/equipment Ambulation Distance (Feet): 10 Feet Assistive device: Rolling walker (2 wheeled) Gait Pattern/deviations: Shuffle;Trunk flexed;Narrow base of support Gait velocity: very slow Gait velocity interpretation: <1.8 ft/sec, indicative of risk for recurrent falls General Gait Details: very slow, +2 for recliner follow    Balance Overall balance assessment: Needs assistance Sitting-balance support: Feet unsupported;Bilateral upper extremity supported Sitting balance-Leahy Scale: Poor     Standing balance support: Bilateral upper extremity supported Librarian, academic) Standing balance-Leahy Scale: Poor                               Pertinent Vitals/Pain Pain Assessment: 0-10 Pain Score: 7  Pain Location: right knee Pain Descriptors / Indicators: Aching Pain Intervention(s): Limited activity within patient's tolerance;Monitored during session;Repositioned    Home Living Family/patient expects to be discharged to:: Assisted living ([facility]) Living Arrangements: Alone Available Help at Discharge: Personal care attendant;Available 24 hours/day (via call bell) Type of Home: Assisted living Home Access: Level entry     Home Layout: One level Home Equipment: Cane - single point;Walker - 4 wheels Additional Comments: Pt uses     Prior Function Level of Independence: Needs assistance   Gait / Transfers Assistance Needed: Pt reports she used RW for mobiltiy  ADL's / Homemaking Assistance Needed: Pt reports that her daughter assists with bathing ("she washes me"). Pt reports she dresses herself.  Comments: Pt with history of dementia     Hand Dominance   Dominant Hand: Right    Extremity/Trunk Assessment  Upper Extremity Assessment: Generalized weakness           Lower Extremity Assessment: RLE deficits/detail RLE Deficits / Details: difficulty  walking and during transfers due to pain and weakness    Cervical / Trunk Assessment: Kyphotic  Communication   Communication: No difficulties  Cognition Arousal/Alertness: Awake/alert Behavior During Therapy: WFL for tasks assessed/performed Overall Cognitive Status: History of cognitive impairments - at baseline (could not remember date of birth)       Memory: Decreased short-term memory              General Comments      Exercises        Assessment/Plan    PT Assessment Patient needs continued PT services  PT Diagnosis Difficulty walking;Abnormality of gait;Generalized weakness;Acute pain;Altered mental status   PT Problem List Decreased strength;Decreased range of motion;Decreased activity tolerance;Decreased balance;Decreased mobility;Decreased cognition;Cardiopulmonary status limiting activity;Obesity;Pain  PT Treatment Interventions DME instruction;Gait training;Stair training;Functional mobility training;Therapeutic activities;Therapeutic exercise;Balance training;Neuromuscular re-education;Patient/family education;Modalities   PT Goals (Current goals can be found in the Care Plan section) Acute Rehab PT Goals Patient Stated Goal: return to walking PT Goal Formulation: With patient Time For Goal Achievement: 01/20/15 Potential to Achieve Goals: Fair    Frequency Min 3X/week   Barriers to discharge   requires 24/7 supervision    Co-evaluation               End of Session Equipment Utilized During Treatment: Gait belt Activity Tolerance: No increased pain;Patient limited by fatigue (Pt reported decreased pain due to activity) Patient left: in chair;with call bell/phone within reach;with chair alarm set (family was reentering room after talking to PT) Nurse Communication: Mobility status    Functional Assessment Tool Used: assistance level Functional Limitation: Mobility: Walking and moving around Mobility: Walking and Moving Around Current Status  (Q7591): At least 20 percent but less than 40 percent impaired, limited or restricted Mobility: Walking and Moving Around Goal Status [phone]): At least 1 percent but less than 20 percent impaired, limited or restricted    Time: 6599-3570 PT Time Calculation (min) (ACUTE ONLY): 44 min   Charges:   PT Evaluation $Initial PT Evaluation Tier I: 1 Procedure PT Treatments $Gait Training: 8-22 mins $Therapeutic Activity: 8-22 mins   PT G Codes:   PT G-Codes **NOT FOR INPATIENT CLASS** Functional Assessment Tool Used: assistance level Functional Limitation: Mobility: Walking and moving around Mobility: Walking and Moving Around Current Status (V7793): At least 20 percent but less than 40 percent impaired, limited or restricted Mobility: Walking and Moving Around Goal Status [phone]): At least 1 percent but less than 20 percent impaired, limited or restricted    Roanna Epley, SPT [phone]  01/06/2015, 3:32 PM

## 2015-01-06 NOTE — Progress Notes (Signed)
Utilization review completed. Bijal Siglin, RN, BSN. 

## 2015-01-07 ENCOUNTER — Observation Stay (HOSPITAL_COMMUNITY): Payer: Medicare Other

## 2015-01-07 DIAGNOSIS — F22 Delusional disorders: Secondary | ICD-10-CM | POA: Diagnosis not present

## 2015-01-07 DIAGNOSIS — M25561 Pain in right knee: Secondary | ICD-10-CM

## 2015-01-07 DIAGNOSIS — I480 Paroxysmal atrial fibrillation: Secondary | ICD-10-CM | POA: Diagnosis not present

## 2015-01-07 DIAGNOSIS — R55 Syncope and collapse: Secondary | ICD-10-CM

## 2015-01-07 DIAGNOSIS — F0391 Unspecified dementia with behavioral disturbance: Secondary | ICD-10-CM | POA: Diagnosis not present

## 2015-01-07 DIAGNOSIS — W19XXXA Unspecified fall, initial encounter: Secondary | ICD-10-CM

## 2015-01-07 LAB — URINE CULTURE

## 2015-01-07 LAB — GLUCOSE, CAPILLARY
GLUCOSE-CAPILLARY: 89 mg/dL (ref 65–99)
Glucose-Capillary: 97 mg/dL (ref 65–99)

## 2015-01-07 MED ORDER — BENZTROPINE MESYLATE 0.5 MG PO TABS: 0.5000 mg | ORAL_TABLET | Freq: Two times a day (BID) | ORAL | Status: AC

## 2015-01-07 MED ORDER — RISPERIDONE 2 MG PO TBDP: 2.0000 mg | ORAL_TABLET | Freq: Every day | ORAL | Status: DC

## 2015-01-07 NOTE — Progress Notes (Signed)
CSW Armed forces technical officer) spoke with pt and pt daughter multiple times today to address concerns about returning to ALF. Decision was made for CSW to send out referral to SNFs to see what options are available for today while pt and pt daughter continue to discuss dc plan.  CSW provided bed offers and pt and pt daughter decided it would be best for pt to dc to [facility]. Both pt and pt daughter are aware that [facility] has said they can offer needed increased in assistance/supervision however they feel more comfortable having pt dc to SNF for short term rehab.   CSW (Clinical Education officer, museum) prepared pt dc packet and placed with shadow chart. CSW arranged non-emergent ambulance transport. Pt, pt family, pt nurse, and facility informed. CSW signing off.   [place], [place]

## 2015-01-07 NOTE — Progress Notes (Signed)
*  PRELIMINARY RESULTS* Echocardiogram 2D Echocardiogram has been performed.  Leavy Cella 01/07/2015, 11:28 AM

## 2015-01-07 NOTE — Discharge Summary (Signed)
PATIENT DETAILS Name: Monica Strong Age: 81 y.o. Sex: female Date of Birth: 07/01/1931 MRN: [id number]. Admitting Physician: Truett Mainland, DO KXF:GHWEX,HBZJIR ALEXANDER, MD  Admit Date: 01/05/2015 Discharge date: 01/07/2015  Recommendations for Outpatient Follow-up:  1. Please reassess need for anticoagulation if patient continues to have recurrent falls in spite of adjustment of medications and 24/7 care at SNF 2. Please avoid sedating medication 3. Please arrange for psychiatry follow-up while at SNF for continued optimization of her medications.  PRIMARY DISCHARGE DIAGNOSIS:  Principal Problem:   Fall Active Problems:   Morbid obesity   Obstructive sleep apnea   Atrial fibrillation   Elevated troponin   Abdominal pain   Knee pain, right   Dementia      PAST MEDICAL HISTORY: Past Medical History  Diagnosis Date  . GERD (gastroesophageal reflux disease)   . H/O hiatal hernia   . Pulmonary nodules BENIGN--  MONITORED BY PCP DR READE--  ASYMPTOMATIC     LAST CHEST CT 08-17-2011  . Heart murmur MILD -- ASYMPTOMATIC  . Urge urinary incontinence   . Sinus drainage   . Neuromuscular disorder     rt hand numbness  . Dysuria     has urethral bump  . Ureteral stent retained   . Paranoia   . Obesity   . Chest pain     a. Normal stress test 08/2008 and CTA neg for PE at that time.  [facility] ear dysfunction     a. Prior h/o vertigo.  Marland Kitchen History of infection due to ESBL Escherichia coli 02/23/2014  . Hypertension   . Atrial fibrillation   . Pulmonary embolism ~ 05/2014  . OSA (obstructive sleep apnea)     not using cpap (01/05/2015)  . Arthritis     "hands" (01/05/2015)  . Renal stones left  . Psychotic episode     "was hospitalized at [facility] 07/2014; was having hallucinations; on RX now" (01/05/2015)  . Poor short term memory     DISCHARGE MEDICATIONS: Current Discharge Medication List    START taking these medications   Details  benztropine (COGENTIN) 0.5  MG tablet Take 1 tablet (0.5 mg total) by mouth 2 (two) times daily.      CONTINUE these medications which have CHANGED   Details  risperidone (RISPERDAL M-TABS) 2 MG disintegrating tablet Take 1 tablet (2 mg total) by mouth at bedtime.      CONTINUE these medications which have NOT CHANGED   Details  acetaminophen (TYLENOL) 325 MG tablet Take 650 mg by mouth every 6 (six) hours as needed for mild pain.    Cholecalciferol (VITAMIN D3) 2000 UNITS capsule Take 2,000 Units by mouth daily.    Cyanocobalamin 1000 MCG/ML KIT Inject 1,000 mLs as directed every 30 (thirty) days.    dextromethorphan (DELSYM) 30 MG/5ML liquid Take 60 mg by mouth every 12 (twelve) hours as needed for cough.    diltiazem (DILACOR XR) 180 MG 24 hr capsule Take 180 mg by mouth daily.    docusate sodium (COLACE) 100 MG capsule Take 100 mg by mouth daily as needed for mild constipation.    furosemide (LASIX) 40 MG tablet Take 40 mg by mouth daily.    Multiple Vitamins-Minerals (MULTIVITAMIN PO) Take 1 tablet by mouth daily.    omeprazole (PRILOSEC) 20 MG capsule Take 20 mg by mouth daily.    polyethylene glycol (MIRALAX / GLYCOLAX) packet Take 17 g by mouth daily. Qty: 14 each, Refills: 0    rivaroxaban (  XARELTO) 20 MG TABS tablet Take 20 mg by mouth daily.      STOP taking these medications     aspirin 81 MG chewable tablet      divalproex (DEPAKOTE ER) 500 MG 24 hr tablet         ALLERGIES:   Allergies  Allergen Reactions  . Diphenhydramine Nausea Only    Sick to stomach  . Flagyl [Metronidazole] Other (See Comments)    HALLUCINATIONS  . Hydrocodone Other (See Comments)    HALLUCINATIONS  . Meperidine Hcl Nausea And Vomiting  . Penicillins Hives  . Sulfonamide Derivatives Hives    BRIEF HPI:  See H&P, Labs, Consult and Test reports for all details in brief, patient is a 81 year old female with a history of atrial fibrillation, prior history of pulmonary embolism on chronic anticoagulation  who was admitted after a unwitnessed fall at her memory care unit/ALF  CONSULTATIONS:   psychiatry  PERTINENT RADIOLOGIC STUDIES: Ct Head Wo Contrast  01/05/2015   CLINICAL DATA:  Fell wall walking down the hall. Altered mental status  EXAM: CT HEAD WITHOUT CONTRAST  TECHNIQUE: Contiguous axial images were obtained from the base of the skull through the vertex without intravenous contrast.  COMPARISON:  04/16/2014  FINDINGS: There is no evidence of mass effect, midline shift, or extra-axial fluid collections. There is no evidence of a space-occupying lesion or intracranial hemorrhage. There is no evidence of a cortical-based area of acute infarction. There is generalized cerebral atrophy. There is periventricular white matter low attenuation likely secondary to microangiopathy.  The ventricles and sulci are appropriate for the patient's age. The basal cisterns are patent.  Visualized portions of the orbits are unremarkable. The visualized portions of the paranasal sinuses and mastoid air cells are unremarkable.  The osseous structures are unremarkable.  IMPRESSION: 1. No acute intracranial pathology. 2. Chronic microvascular disease and cerebral atrophy.   Electronically Signed   By: Kathreen Devoid   On: 01/05/2015 15:53   Dg Knee Complete 4 Views Right  01/05/2015   CLINICAL DATA:  Fall wall walking down the hallway today. Anterior right knee pain. Initial encounter.  EXAM: RIGHT KNEE - COMPLETE 4+ VIEW  COMPARISON:  None.  FINDINGS: Total knee arthroplasty is seen with all 3 components in expected position. There is no evidence of fracture, dislocation, or joint effusion. No other focal bone lesion identified. Generalized osteopenia noted. Soft tissues are unremarkable.  IMPRESSION: No acute findings.  Total knee arthroplasty and osteopenia.   Electronically Signed   By: Earle Gell M.D.   On: 01/05/2015 18:07   Dg Abd 2 Views  01/05/2015   CLINICAL DATA:  Fall.  Left upper quadrant pain.  EXAM: ABDOMEN  - 2 VIEW  COMPARISON:  04/23/2014  FINDINGS: The bowel gas pattern is normal. There is no evidence of free air. No radio-opaque calculi or other significant radiographic abnormality is seen.  IMPRESSION: Negative.   Electronically Signed   By: Kerby Moors M.D.   On: 01/05/2015 18:08     PERTINENT LAB RESULTS: CBC:  Recent Labs  01/05/15 1417  WBC 7.0  HGB 12.7  HCT 38.6  PLT 155   CMET CMP     Component Value Date/Time   NA 141 01/06/2015 0051   K 3.7 01/06/2015 0051   CL 104 01/06/2015 0051   CO2 28 01/06/2015 0051   GLUCOSE 110* 01/06/2015 0051   BUN 16 01/06/2015 0051   CREATININE 0.90 01/06/2015 0051   CALCIUM 9.1 01/06/2015  0051   PROT 5.6* 01/06/2015 1145   ALBUMIN 3.1* 01/06/2015 1145   AST 18 01/06/2015 1145   ALT 12* 01/06/2015 1145   ALKPHOS 57 01/06/2015 1145   BILITOT 0.6 01/06/2015 1145   GFRNONAA 58* 01/06/2015 0051   GFRAA >60 01/06/2015 0051    GFR CrCl cannot be calculated (Unknown ideal weight.). No results for input(s): LIPASE, AMYLASE in the last 72 hours.  Recent Labs  01/05/15 1417 01/05/15 2005 01/06/15 0051  TROPONINI 0.07* 0.22* 0.18*   Invalid input(s): POCBNP No results for input(s): DDIMER in the last 72 hours. No results for input(s): HGBA1C in the last 72 hours. No results for input(s): CHOL, HDL, LDLCALC, TRIG, CHOLHDL, LDLDIRECT in the last 72 hours.  Recent Labs  01/05/15 2005  TSH 0.914   No results for input(s): VITAMINB12, FOLATE, FERRITIN, TIBC, IRON, RETICCTPCT in the last 72 hours. Coags: No results for input(s): INR in the last 72 hours.  Invalid input(s): PT Microbiology: Recent Results (from the past 240 hour(s))  Urine culture     Status: None   Collection Time: 01/05/15  3:39 PM  Result Value Ref Range Status   Specimen Description URINE, RANDOM  Final   Special Requests NONE  Final   Culture MULTIPLE SPECIES PRESENT, SUGGEST RECOLLECTION  Final   Report Status 01/07/2015 FINAL  Final     BRIEF  HOSPITAL COURSE:   Principal Problem:   Fall: Admitted following a unwitnessed fall for further evaluation. Upon further history taking and discussion with family, doubt syncopal event. Although has had prior syncopal events, per daughter-[name] (discussed by this M.D. over the phone) she thinks this is her first episode of fall in the past one year. CT head was negative, patient was evaluated by physical therapy services and recommendations were for SNF. Initially family contemplated taking patient back to her assisted living facility/memory care unit-however since family elected to continue use of anticoagulation, upon further discussion, family has now agreed to upgrade to SNF for further close monitoring. Given advanced dementia-she is definitely a fall risk. Family at this time wants to continue with anticoagulation as much as possible-she has a history of pulmonary embolism this past December, and also has a history of atrial fibrillation with dilated left atrium. Family is aware of catastrophic falls-resulting in intracranial hemorrhage which could be life-threatening. Furthermore, no etiology for fall evident, however given the fact that she has dementia-psychiatry was consulted, they recommended discontinuing Depakote and Seroquel-and only continuing Risperdal. Patient will require close monitoring by psychiatry in the outpatient setting.  Active Problems: Minimally elevated troponin: Noted in the pattern of ACS, patient denied any chest pain or shortness of breath. Suspect false positive elevation. 2-D echocardiogram showed preserved ejection fraction of around 50-55%, no regional wall motion abnormalities, left atrium was moderately dilated. Since she will be on Xarelto-we will discontinue aspirin at the time of discharge. Given advanced dementia, suspect she is a poor candidate for any further investigations/aggressive care.  Right knee pain: Improved with supportive care. X-ray was negative for  fracture.  Chronic atrial fibrillation: Rate controlled with Cardizem, anticoagulated with Xarelto. Please see above discussion with family-daughter [name] by this M.D. Family at this time cautiously elects to continue with anticoagulation,. Family aware of catastrophic fall risk  History of pulmonary embolism: Reviewed prior PCCM note (05/15/14), recommendations were to stay on anticoagulation indefinitely unless no obvious contraindication. Please see above discussion with family.  Right lung nodule: Defer  follow-up with the outpatient setting. Patient follows  up with Dr. Gwenlyn Perking pulmonary-please ensure follow-up with him.  TODAY-DAY OF DISCHARGE:  Subjective:   Ksenia Kunz today has no headache,no chest abdominal pain. She remains pleasantly confused.  Objective:   Blood pressure 119/65, pulse 94, temperature 97.8 F (36.6 C), temperature source Oral, resp. rate 16, weight 97.523 kg (215 lb), SpO2 93 %. No intake or output data in the 24 hours ending 01/07/15 1434 Filed Weights   01/06/15 0522 01/07/15 0543  Weight: 97.297 kg (214 lb 8 oz) 97.523 kg (215 lb)    Exam Awake and pleasantly confused, No new F.N deficits, Normal affect [place].AT,PERRAL Supple Neck,No JVD, No cervical lymphadenopathy appriciated.  Symmetrical Chest wall movement, Good air movement bilaterally, CTAB RRR,No Gallops,Rubs or new Murmurs, No Parasternal Heave +ve B.Sounds, Abd Soft, Non tender, No organomegaly appriciated, No rebound -guarding or rigidity. No Cyanosis, Clubbing or edema, No new Rash or bruise  DISCHARGE CONDITION: Stable  DISPOSITION: SNF  DISCHARGE INSTRUCTIONS:    Activity:  As tolerated with Full fall precautions use walker/cane & assistance as needed  Diet recommendation: Heart Healthy diet  Discharge Instructions    Diet - low sodium heart healthy    Complete by:  As directed      Increase activity slowly    Complete by:  As directed            Follow-up  Information    Please follow up.   Why:  Psychiatrist recommendation for outpatient psychiatric follow-up upon discharge.   Contact information:   Patient to be followed by SNF rounding Psychiatrist at time of discharge       Follow up with Vena Austria, MD. Schedule an appointment as soon as possible for a visit in 1 week.   Specialty:  Family Medicine   Contact information:   [place] [address] [phone]       Total Time spent on discharge equals 45 minutes.  SignedOren Binet 01/07/2015 2:34 PM

## 2015-01-07 NOTE — Care Management Note (Addendum)
Case Management Note  Patient Details  Name: Monica Strong MRN: [id number] Date of Birth: [birth year]  Subjective/Objective:  Pt admitted for fall. Pt is from [place]. Plan to return once stable for d/c.                  Action/Plan: CM did speak with pt and daughter in reference to disposition needs. Daughter would like for pt to return to [facility]. CM did call to make sure pt would have supervision. [facility] stating they can provide care. (Per daughter pt was active with [facility]) Daughter would like to change agencies to [place]. Per staff at [facility]- services from [place] has been canceled. CM did call [facility] to make referral. SOC to begin within 24-48 hrs post d/c. No further needs from CM at this time.    Expected Discharge Date:                  Expected Discharge Plan:     In-House Referral:     Discharge planning Services     Post Acute Care Choice:    Choice offered to:     DME Arranged:    DME Agency:     HH Arranged:    [place] Agency:     Status of Service:     Medicare Important Message Given:    Date Medicare IM Given:    Medicare IM give by:    Date Additional Medicare IM Given:    Additional Medicare Important Message give by:     If discussed at [place] of Stay Meetings, dates discussed:    Additional Comments: [place] 01-07-15 Jacqlyn Krauss, RN, BSN [phone] Pt will be d/c to [facility] and then plan is to return to [facility] soon with [facility] services with [facility]. No further needs from CM at this time.   Bethena Roys, RN 01/07/2015, 11:47 AM

## 2015-01-07 NOTE — Consult Note (Signed)
[facility] Consult follow-up  Reason for Consult:  Dementia and psychosis Referring Physician:  Dr. Venetia Constable Patient Identification: Monica Strong MRN:  [id number] Principal Diagnosis: Fall/paranoid psychosis Diagnosis:   Patient Active Problem List   Diagnosis Date Noted  . Elevated troponin [R79.89] 01/05/2015  . Abdominal pain [R10.9] 01/05/2015  . Knee pain, right [M25.561] 01/05/2015  . Dementia [F03.90] 01/05/2015  . Fall [W19.XXXA] 01/05/2015  . Hilar adenopathy [R59.9] 05/15/2014  . Faintness [R55]   . PE (pulmonary embolism) [I26.99]   . Thrombocytopenia [D69.6] 04/15/2014  . LOC (loss of consciousness) [R40.4] 04/15/2014  . Transient loss of consciousness [R55] 04/05/2014  . Syncope [R55] 04/05/2014  . History of infection due to ESBL Escherichia coli [Z86.19] 02/23/2014  . Demand ischemia [I24.8] 02/23/2014  . Psychosis [F29] 02/22/2014  . Atrial fibrillation [I48.91] 02/17/2014  . Altered mental status [R41.82] 02/17/2014  . Paranoia [F22] 02/17/2014  . Troponin level elevated [R79.89] 02/17/2014  . Unspecified vitamin D deficiency [E55.9] 02/19/2013  . Osteopenia [M85.80] 02/19/2013  . Morbid obesity [E66.01] 06/14/2010  . Obstructive sleep apnea [G47.33] 06/14/2010  . GERD [K21.9] 06/14/2010  . DIVERTICULOSIS OF COLON [K57.30] 06/14/2010  . RECTAL BLEEDING [K62.5] 06/14/2010  . COLONIC POLYPS, HX OF [Z86.010] 06/14/2010    Total Time spent with patient: 30 minutes  Subjective:   Monica Strong is a 81 y.o. female patient admitted with frequent falls.  HPI:  Monica Strong is a 81 y.o. Female seen face-to-face for the psychiatric consultation and evaluation of frequent falls, shuffling gait and history of paranoid delusions, agitation and aggressive behaviors. Case discussed with the psychiatric social service, staff RN and physical therapist regarding recent deterioration of physical activity and frequent falls needed close supervision. Case  discussed with the patient daughter Monica Strong who is in the waiting area. Patient daughter is concerned about her both mental and physical deterioration. Patient has been placed in a nursing home secondary to increased symptoms of paranoid psychosis, agitation and aggressive behaviors. As per Monica Strong patient was able to walk with walker or cane before came to the hospital now she needed close supervision and physical therapy. She is also highly frustrated about not able to find the geriatric psychiatry local area because everyone saying she has severe long waiting period to see a psychiatrist. Patient daughter is not elevated off who is providing her psychiatric medications prescriptions since he was discharged from the [facility] psychiatric hospitalization. Patient denies current symptoms of suicidal/homicidal ideation intention or plans.  Interval history: Patient seen for psychiatric consultation follow-up today. Patient has no complaints and at the same time she does not remember specifics of sleeping on not sleeping, eating or not eating. Patient reports she ate lunch but does not remember what she ate within an hour. Patient is verbally talkative and also talks finding wants make jokes. Patient reported she is concerned about how people are judging her performance but actually she judging herself. Patient reportedly slept only 4 hours last night and took a few hours of nap during this daytime. Patient reported no physical therapy activity was done so far today. Patient daughter who is at bedside stated that not seen any changes since yesterday. Patient has no acute symptoms of psychosis, agitation or aggressive behaviors.  Past Medical History:  Past Medical History  Diagnosis Date  . GERD (gastroesophageal reflux disease)   . H/O hiatal hernia   . Pulmonary nodules BENIGN--  MONITORED BY PCP DR READE--  ASYMPTOMATIC     LAST CHEST  CT 08-17-2011  . Heart murmur MILD -- ASYMPTOMATIC  . Urge urinary  incontinence   . Sinus drainage   . Neuromuscular disorder     rt hand numbness  . Dysuria     has urethral bump  . Ureteral stent retained   . Paranoia   . Obesity   . Chest pain     a. Normal stress test 08/2008 and CTA neg for PE at that time.  Jola [facility] ear dysfunction     a. Prior h/o vertigo.  Marland Kitchen History of infection due to ESBL Escherichia coli 02/23/2014  . Hypertension   . Atrial fibrillation   . Pulmonary embolism ~ 05/2014  . OSA (obstructive sleep apnea)     not using cpap (01/05/2015)  . Arthritis     "hands" (01/05/2015)  . Renal stones left  . Psychotic episode     "was hospitalized at [facility] 07/2014; was having hallucinations; on RX now" (01/05/2015)  . Poor short term memory     Past Surgical History  Procedure Laterality Date  . Total knee arthroplasty Left 10-17-2009  DR Wynelle Link  . Total knee arthroplasty Right 04-25-2009  . Tonsillectomy    . Cardiovascular stress test  03-13-2007  dr Tressia Miners turner    NORMAL LV SIZE SYSTOLIC FUCTION/ NO ISCHEMIA/ EF 85%  . Transthoracic echocardiogram  08-18-2008    NORMAL LVSF/ EF 55-60%/ MILD MITRAL REGURG .  Marland Kitchen Removal breast cyst, benign  1960's  . Vaginal hysterectomy  1970  . Cataract extraction w/ intraocular lens  implant, bilateral    . Blepharoplasty Bilateral   . Cystoscopy with retrograde pyelogram, ureteroscopy and stent placement  05/14/2012    Procedure: [place], URETEROSCOPY AND STENT PLACEMENT;  Surgeon: Alexis Frock, MD;  Location: [facility];  Service: Urology;  Laterality: Left;  1ST STAGE LEFT URETEROSCOPY, LEFT RETROGRADE, DIGITAL URETEROSCOPY,  LEFT STONE REMOVAL WITH ESCAPE BASKET,  STENT PLACEMENt   . Holmium laser application  25/09/9824    Procedure: HOLMIUM LASER APPLICATION;  Surgeon: Alexis Frock, MD;  Location: [facility];  Service: Urology;  Laterality: Left;  . Holmium laser application  09/09/5828    Procedure: HOLMIUM LASER  APPLICATION;  Surgeon: Alexis Frock, MD;  Location: [facility];  Service: Urology;  Laterality: Left;  . Cystoscopy/retrograde/ureteroscopy/stone extraction with basket  06/13/2012    Procedure: CYSTOSCOPY/RETROGRADE/URETEROSCOPY/STONE EXTRACTION WITH BASKET;  Surgeon: Alexis Frock, MD;  Location: [facility];  Service: Urology;  Laterality: Left;  . Cystoscopy w/ ureteral stent placement  06/13/2012    Procedure: CYSTOSCOPY WITH STENT REPLACEMENT;  Surgeon: Alexis Frock, MD;  Location: [facility];  Service: Urology;  Laterality: Left;  . Appendectomy      PT STATES PER XRAY SMALL AMOUNT OF APPENDIX LEFT  . Cystoscopy w/ ureteral stent removal  07/11/2012    Procedure: CYSTOSCOPY WITH STENT REMOVAL;  Surgeon: Alexis Frock, MD;  Location: [facility];  Service: Urology;  Laterality: Left;  . Cystoscopy w/ retrogrades  07/11/2012    Procedure: CYSTOSCOPY WITH RETROGRADE PYELOGRAM;  Surgeon: Alexis Frock, MD;  Location: [facility];  Service: Urology;  Laterality: Left;  left third stage ureteroscopystone manipulation  . Ureteroscopy  07/11/2012    Procedure: URETEROSCOPY;  Surgeon: Alexis Frock, MD;  Location: [facility];  Service: Urology;  Laterality: Left;  . Joint replacement     Family History:  Family History  Problem Relation Age of Onset  .  Other      Pt unaware due to psych condition  . Heart attack Father    Social History:  History  Alcohol Use No     History  Drug Use No    History   Social History  . Marital Status: Divorced    Spouse Name: N/A  . Number of Children: N/A  . Years of Education: N/A   Social History Main Topics  . Smoking status: Never Smoker   . Smokeless tobacco: Never Used  . Alcohol Use: No  . Drug Use: No  . Sexual Activity: Not on file   Other Topics Concern  . None   Social History Narrative   Additional Social History:                           Allergies:   Allergies  Allergen Reactions  . Diphenhydramine Nausea Only    Sick to stomach  . Flagyl [Metronidazole] Other (See Comments)    HALLUCINATIONS  . Hydrocodone Other (See Comments)    HALLUCINATIONS  . Meperidine Hcl Nausea And Vomiting  . Penicillins Hives  . Sulfonamide Derivatives Hives    Labs:  Results for orders placed or performed during the hospital encounter of 01/05/15 (from the past 48 hour(s))  Basic metabolic panel     Status: Abnormal   Collection Time: 01/05/15  2:17 PM  Result Value Ref Range   Sodium 141 135 - 145 mmol/L   Potassium 3.4 (L) 3.5 - 5.1 mmol/L   Chloride 105 101 - 111 mmol/L   CO2 27 22 - 32 mmol/L   Glucose, Bld 126 (H) 65 - 99 mg/dL   BUN 16 6 - 20 mg/dL   Creatinine, Ser 9.70 0.44 - 1.00 mg/dL   Calcium 9.1 8.9 - 62.0 mg/dL   GFR calc non Af Amer 55 (L) >60 mL/min   GFR calc Af Amer >60 >60 mL/min    Comment: (NOTE) The eGFR has been calculated using the CKD EPI equation. This calculation has not been validated in all clinical situations. eGFR's persistently <60 mL/min signify possible Chronic Kidney Disease.    Anion gap 9 5 - 15  CBC with Differential/Platelet     Status: Abnormal   Collection Time: 01/05/15  2:17 PM  Result Value Ref Range   WBC 7.0 4.0 - 10.5 K/uL   RBC 3.87 3.87 - 5.11 MIL/uL   Hemoglobin 12.7 12.0 - 15.0 g/dL   HCT 67.9 40.1 - 89.9 %   MCV 99.7 78.0 - 100.0 fL   MCH 32.8 26.0 - 34.0 pg   MCHC 32.9 30.0 - 36.0 g/dL   RDW 21.2 35.6 - 26.8 %   Platelets 155 150 - 400 K/uL   Neutrophils Relative % 80 (H) 43 - 77 %   Neutro Abs 5.6 1.7 - 7.7 K/uL   Lymphocytes Relative 9 (L) 12 - 46 %   Lymphs Abs 0.7 0.7 - 4.0 K/uL   Monocytes Relative 11 3 - 12 %   Monocytes Absolute 0.8 0.1 - 1.0 K/uL   Eosinophils Relative 0 0 - 5 %   Eosinophils Absolute 0.0 0.0 - 0.7 K/uL   Basophils Relative 0 0 - 1 %   Basophils Absolute 0.0 0.0 - 0.1 K/uL  Troponin I     Status: Abnormal    Collection Time: 01/05/15  2:17 PM  Result Value Ref Range   Troponin I 0.07 (H) <0.031 ng/mL  Comment:        PERSISTENTLY INCREASED TROPONIN VALUES IN THE RANGE OF 0.04-0.49 ng/mL CAN BE SEEN IN:       -UNSTABLE ANGINA       -CONGESTIVE HEART FAILURE       -MYOCARDITIS       -CHEST TRAUMA       -ARRYHTHMIAS       -LATE PRESENTING MYOCARDIAL INFARCTION       -COPD   CLINICAL FOLLOW-UP RECOMMENDED.   Urine culture     Status: None   Collection Time: 01/05/15  3:39 PM  Result Value Ref Range   Specimen Description URINE, RANDOM    Special Requests NONE    Culture MULTIPLE SPECIES PRESENT, SUGGEST RECOLLECTION    Report Status 01/07/2015 FINAL   Urinalysis, Routine w reflex microscopic (not at [facility])     Status: None   Collection Time: 01/05/15  3:39 PM  Result Value Ref Range   Color, Urine YELLOW YELLOW   APPearance CLEAR CLEAR   Specific Gravity, Urine 1.009 1.005 - 1.030   pH 7.5 5.0 - 8.0   Glucose, UA NEGATIVE NEGATIVE mg/dL   Hgb urine dipstick NEGATIVE NEGATIVE   Bilirubin Urine NEGATIVE NEGATIVE   Ketones, ur NEGATIVE NEGATIVE mg/dL   Protein, ur NEGATIVE NEGATIVE mg/dL   Urobilinogen, UA 0.2 0.0 - 1.0 mg/dL   Nitrite NEGATIVE NEGATIVE   Leukocytes, UA NEGATIVE NEGATIVE    Comment: MICROSCOPIC NOT DONE ON URINES WITH NEGATIVE PROTEIN, BLOOD, LEUKOCYTES, NITRITE, OR GLUCOSE <1000 mg/dL.  Magnesium     Status: None   Collection Time: 01/05/15  8:05 PM  Result Value Ref Range   Magnesium 1.8 1.7 - 2.4 mg/dL  Troponin I     Status: Abnormal   Collection Time: 01/05/15  8:05 PM  Result Value Ref Range   Troponin I 0.22 (H) <0.031 ng/mL    Comment:        PERSISTENTLY INCREASED TROPONIN VALUES IN THE RANGE OF 0.04-0.49 ng/mL CAN BE SEEN IN:       -UNSTABLE ANGINA       -CONGESTIVE HEART FAILURE       -MYOCARDITIS       -CHEST TRAUMA       -ARRYHTHMIAS       -LATE PRESENTING MYOCARDIAL INFARCTION       -COPD   CLINICAL FOLLOW-UP RECOMMENDED.   TSH      Status: None   Collection Time: 01/05/15  8:05 PM  Result Value Ref Range   TSH 0.914 0.350 - 4.500 uIU/mL  Valproic acid level     Status: Abnormal   Collection Time: 01/05/15  8:05 PM  Result Value Ref Range   Valproic Acid Lvl <10 (L) 50.0 - 100.0 ug/mL    Comment: RESULTS CONFIRMED BY MANUAL DILUTION  Troponin I     Status: Abnormal   Collection Time: 01/06/15 12:51 AM  Result Value Ref Range   Troponin I 0.18 (H) <0.031 ng/mL    Comment:        PERSISTENTLY INCREASED TROPONIN VALUES IN THE RANGE OF 0.04-0.49 ng/mL CAN BE SEEN IN:       -UNSTABLE ANGINA       -CONGESTIVE HEART FAILURE       -MYOCARDITIS       -CHEST TRAUMA       -ARRYHTHMIAS       -LATE PRESENTING MYOCARDIAL INFARCTION       -COPD   CLINICAL FOLLOW-UP RECOMMENDED.   Basic  metabolic panel     Status: Abnormal   Collection Time: 01/06/15 12:51 AM  Result Value Ref Range   Sodium 141 135 - 145 mmol/L   Potassium 3.7 3.5 - 5.1 mmol/L   Chloride 104 101 - 111 mmol/L   CO2 28 22 - 32 mmol/L   Glucose, Bld 110 (H) 65 - 99 mg/dL   BUN 16 6 - 20 mg/dL   Creatinine, Ser 0.90 0.44 - 1.00 mg/dL   Calcium 9.1 8.9 - 10.3 mg/dL   GFR calc non Af Amer 58 (L) >60 mL/min   GFR calc Af Amer >60 >60 mL/min    Comment: (NOTE) The eGFR has been calculated using the CKD EPI equation. This calculation has not been validated in all clinical situations. eGFR's persistently <60 mL/min signify possible Chronic Kidney Disease.    Anion gap 9 5 - 15  Glucose, capillary     Status: Abnormal   Collection Time: 01/06/15  5:34 AM  Result Value Ref Range   Glucose-Capillary 100 (H) 65 - 99 mg/dL  Hepatic function panel     Status: Abnormal   Collection Time: 01/06/15 11:45 AM  Result Value Ref Range   Total Protein 5.6 (L) 6.5 - 8.1 g/dL   Albumin 3.1 (L) 3.5 - 5.0 g/dL   AST 18 15 - 41 U/L   ALT 12 (L) 14 - 54 U/L   Alkaline Phosphatase 57 38 - 126 U/L   Total Bilirubin 0.6 0.3 - 1.2 mg/dL   Bilirubin, Direct 0.1 0.1 -  0.5 mg/dL   Indirect Bilirubin 0.5 0.3 - 0.9 mg/dL  Glucose, capillary     Status: None   Collection Time: 01/07/15  6:02 AM  Result Value Ref Range   Glucose-Capillary 97 65 - 99 mg/dL  Glucose, capillary     Status: None   Collection Time: 01/07/15  7:51 AM  Result Value Ref Range   Glucose-Capillary 89 65 - 99 mg/dL    Vitals: Blood pressure 119/65, pulse 94, temperature 97.8 F (36.6 C), temperature source Oral, resp. rate 16, weight 97.523 kg (215 lb), SpO2 93 %.  Risk to Self: Is patient at risk for suicide?: No Risk to Others:   Prior Inpatient Therapy:   Prior Outpatient Therapy:    Current Facility-Administered Medications  Medication Dose Route Frequency Provider Last Rate Last Dose  . acetaminophen (TYLENOL) tablet 650 mg  650 mg Oral Q6H PRN Melton Alar, PA-C   650 mg at 01/05/15 2310  . alum & mag hydroxide-simeth (MAALOX/MYLANTA) 200-200-20 MG/5ML suspension 30 mL  30 mL Oral Q6H PRN Melton Alar, PA-C      . aspirin chewable tablet 81 mg  81 mg Oral Daily Melton Alar, PA-C   81 mg at 01/07/15 0906  . benztropine (COGENTIN) tablet 0.5 mg  0.5 mg Oral BID Ambrose Finland, MD   0.5 mg at 01/07/15 0906  . diltiazem (DILACOR XR) 24 hr capsule 180 mg  180 mg Oral Daily Melton Alar, PA-C   180 mg at 01/07/15 8416  . docusate sodium (COLACE) capsule 100 mg  100 mg Oral Daily PRN Melton Alar, PA-C   100 mg at 01/05/15 2310  . furosemide (LASIX) tablet 40 mg  40 mg Oral Daily Melton Alar, PA-C   40 mg at 01/07/15 0906  . multivitamin liquid 5 mL  5 mL Oral Daily Tanna Savoy Stinson, DO   5 mL at 01/07/15 0906  . MUSCLE RUB CREA  Topical BID Tanna Savoy Stinson, DO      . ondansetron [facility]) tablet 4 mg  4 mg Oral Q6H PRN Melton Alar, PA-C       Or  . ondansetron [facility]) injection 4 mg  4 mg Intravenous Q6H PRN Melton Alar, PA-C      . pantoprazole (PROTONIX) EC tablet 40 mg  40 mg Oral Daily Melton Alar, PA-C   40 mg at 01/07/15 0906  .  polyethylene glycol (MIRALAX / GLYCOLAX) packet 17 g  17 g Oral Daily Melton Alar, PA-C   17 g at 01/07/15 4650  . risperiDONE (RISPERDAL M-TABS) disintegrating tablet 2 mg  2 mg Oral QHS Charlynne Cousins, MD   2 mg at 01/06/15 2123  . rivaroxaban (XARELTO) tablet 20 mg  20 mg Oral Daily Melton Alar, PA-C   20 mg at 01/07/15 3546  . sodium chloride 0.9 % injection 3 mL  3 mL Intravenous Q12H Melton Alar, PA-C   3 mL at 01/07/15 5681  . zolpidem (AMBIEN) tablet 5 mg  5 mg Oral QHS PRN Gardiner Barefoot, NP   5 mg at 01/06/15 0130    Musculoskeletal: Strength & Muscle Tone: decreased Gait & Station: shuffle, Need assistance Patient leans: Front  Psychiatric Specialty Exam: Physical Exam as per history and physical   ROS frequent falls, atrial fibrillation, knee pain, memory deficits, decreased mobility with short steps and needed assistance but denied nausea, vomiting, chest pain and shortness of breath  Blood pressure 119/65, pulse 94, temperature 97.8 F (36.6 C), temperature source Oral, resp. rate 16, weight 97.523 kg (215 lb), SpO2 93 %.Body mass index is 41.99 kg/(m^2).  General Appearance: Casual  Eye Contact::  Good  Speech:  Clear and Coherent  Volume:  Normal  Mood:  Euthymic  Affect:  Appropriate and Congruent  Thought Process:  Coherent and Goal Directed  Orientation:  Full (Time, Place, and Person)  Thought Content:  WDL  Suicidal Thoughts:  No  Homicidal Thoughts:  No  Memory:  Immediate;   Fair Recent;   Fair  Judgement:  Intact  Insight:  Fair  Psychomotor Activity:  Shuffling Gait  Concentration:  Fair  Recall:  Good  Fund of Knowledge:Good  Language: Good  Akathisia:  Negative  Handed:  Right  AIMS (if indicated):     Assets:  Communication Skills Desire for Improvement Financial Resources/Insurance Housing Intimacy Leisure Time Resilience Social Support Transportation  ADL's:  Impaired  Cognition: Impaired,  Mild  Sleep:       Medical Decision Making: Review of Psycho-Social Stressors (1), Review or order clinical lab tests (1), New Problem, with no additional work-up planned (3), Review of Last Therapy Session (1), Review or order medicine tests (1), Review of Medication Regimen & Side Effects (2) and Review of New Medication or Change in Dosage (2)  Treatment Plan Summary:  Patient presented with frequent falls and parkinsonism symptoms needing medication adjustment at this time. Patient has no safety concerns in terms of suicidal/homicidal ideation intention or plans. Patient has no evidence of psychosis.  Daily contact with patient to assess and evaluate symptoms and progress in treatment and Medication management  Plan:   Discontinue Depakote secondary to increased sedation  Continue risperidone 2 mg at bedtime for psychosis Continue benztropine 0.5 mg twice daily for EPS Patient does not meet criteria for psychiatric inpatient admission. Supportive therapy provided about ongoing stressors.  Refer to the psychiatric social service regarding outpatient  geriatric psychiatric referrals   Disposition: Patient may discharged to [facility] nursing home when medically stable.   Jessia Kief,JANARDHAHA R. 01/07/2015 1:11 PM

## 2015-01-07 NOTE — Clinical Social Work Psych Note (Signed)
Patient to be followed by [facility] rounding Psychiatrist once returned to facility.  [facility] admissions coordinator to assist patient's daughter with appropriate paperwork. Unit CSW updated regarding information above.  Lubertha Sayres, [place] Clinical Social Work Department Orthopedics [phone]) and Surgical [phone])

## 2015-01-10 ENCOUNTER — Non-Acute Institutional Stay (SKILLED_NURSING_FACILITY): Payer: Medicare Other | Admitting: Internal Medicine

## 2015-01-10 ENCOUNTER — Encounter: Payer: Self-pay | Admitting: Internal Medicine

## 2015-01-10 DIAGNOSIS — I509 Heart failure, unspecified: Secondary | ICD-10-CM | POA: Diagnosis not present

## 2015-01-10 DIAGNOSIS — R911 Solitary pulmonary nodule: Secondary | ICD-10-CM

## 2015-01-10 DIAGNOSIS — M25561 Pain in right knee: Secondary | ICD-10-CM | POA: Diagnosis not present

## 2015-01-10 DIAGNOSIS — K5901 Slow transit constipation: Secondary | ICD-10-CM | POA: Diagnosis not present

## 2015-01-10 DIAGNOSIS — R531 Weakness: Secondary | ICD-10-CM | POA: Diagnosis not present

## 2015-01-10 DIAGNOSIS — K219 Gastro-esophageal reflux disease without esophagitis: Secondary | ICD-10-CM

## 2015-01-10 DIAGNOSIS — I2699 Other pulmonary embolism without acute cor pulmonale: Secondary | ICD-10-CM

## 2015-01-10 DIAGNOSIS — F0391 Unspecified dementia with behavioral disturbance: Secondary | ICD-10-CM

## 2015-01-10 DIAGNOSIS — G259 Extrapyramidal and movement disorder, unspecified: Secondary | ICD-10-CM | POA: Diagnosis not present

## 2015-01-10 DIAGNOSIS — I480 Paroxysmal atrial fibrillation: Secondary | ICD-10-CM | POA: Diagnosis not present

## 2015-01-10 NOTE — Progress Notes (Signed)
Patient ID: DOSHA BROSHEARS, female   DOB: 12-11-1931, 81 y.o.   MRN: [id number]      [place]  PCP: Vena Austria, MD  Code Status: DNR  Allergies  Allergen Reactions  . Diphenhydramine Nausea Only    Sick to stomach  . Flagyl [Metronidazole] Other (See Comments)    HALLUCINATIONS  . Hydrocodone Other (See Comments)    HALLUCINATIONS  . Meperidine Hcl Nausea And Vomiting  . Penicillins Hives  . Sulfonamide Derivatives Hives    Chief Complaint  Patient presents with  . New Admit To SNF    New Admission      HPI:  81 y.o. patient is here for short term rehabilitation post hospital admission from 01/05/15-01/07/15 post unwitnessed fall. Ct head was negative. She had right knee pain and fracture was ruled out. She was seen by psych services and her seroquel and depakote were discontinued. Family wanted her anticoagulation to be continued and understand the risk of it with patient being a high fall risk. She has medical history of atrial fibrillation, pulmonary embolism on chronic anticoagulation, advanced dementia and was residing in memory care until prior to this. She is seen in her room today. She had a bowel movement today after a week and feels better. She was having some discomfort in left side of her abdomen which is now better after the bowel movement.   Review of Systems:  Constitutional: Negative for fever, chills, diaphoresis. positive for malaise HENT: Negative for headache, congestion, nasal discharge Eyes: Negative for eye pain, blurred vision, double vision and discharge.  Respiratory: Negative for cough, shortness of breath and wheezing.   Cardiovascular: Negative for chest pain, palpitations, leg swelling.  Gastrointestinal: Negative for heartburn, nausea, vomiting, abdominal pain. Appetite is good Genitourinary: Negative for dysuria Musculoskeletal: Negative for back pain, falls Skin: Negative for itching, rash.  Neurological: positive  for dizziness with position change Psychiatric/Behavioral: Negative for acute behavioral changes   Past Medical History  Diagnosis Date  . GERD (gastroesophageal reflux disease)   . H/O hiatal hernia   . Pulmonary nodules BENIGN--  MONITORED BY PCP DR READE--  ASYMPTOMATIC     LAST CHEST CT 08-17-2011  . Heart murmur MILD -- ASYMPTOMATIC  . Urge urinary incontinence   . Sinus drainage   . Neuromuscular disorder     rt hand numbness  . Dysuria     has urethral bump  . Ureteral stent retained   . Paranoia   . Obesity   . Chest pain     a. Normal stress test 08/2008 and CTA neg for PE at that time.  Jola Baptist ear dysfunction     a. Prior h/o vertigo.  Marland Kitchen History of infection due to ESBL Escherichia coli 02/23/2014  . Hypertension   . Atrial fibrillation   . Pulmonary embolism ~ 05/2014  . OSA (obstructive sleep apnea)     not using cpap (01/05/2015)  . Arthritis     "hands" (01/05/2015)  . Renal stones left  . Psychotic episode     "was hospitalized at [facility] 07/2014; was having hallucinations; on RX now" (01/05/2015)  . Poor short term memory    Past Surgical History  Procedure Laterality Date  . Total knee arthroplasty Left 10-17-2009  DR Wynelle Link  . Total knee arthroplasty Right 04-25-2009  . Tonsillectomy    . Cardiovascular stress test  03-13-2007  dr Tressia Miners turner    NORMAL LV SIZE SYSTOLIC FUCTION/ NO ISCHEMIA/ EF 85%  .  Transthoracic echocardiogram  08-18-2008    NORMAL LVSF/ EF 55-60%/ MILD MITRAL REGURG .  Marland Kitchen Removal breast cyst, benign  1960's  . Vaginal hysterectomy  1970  . Cataract extraction w/ intraocular lens  implant, bilateral    . Blepharoplasty Bilateral   . Cystoscopy with retrograde pyelogram, ureteroscopy and stent placement  05/14/2012    Procedure: [place], URETEROSCOPY AND STENT PLACEMENT;  Surgeon: Alexis Frock, MD;  Location: [facility];  Service: Urology;  Laterality: Left;  1ST STAGE LEFT  URETEROSCOPY, LEFT RETROGRADE, DIGITAL URETEROSCOPY,  LEFT STONE REMOVAL WITH ESCAPE BASKET,  STENT PLACEMENt   . Holmium laser application  32/02/5187    Procedure: HOLMIUM LASER APPLICATION;  Surgeon: Alexis Frock, MD;  Location: [facility]  Service: Urology;  Laterality: Left;  . Holmium laser application  09/09/6604    Procedure: HOLMIUM LASER APPLICATION;  Surgeon: Alexis Frock, MD;  Location: [facility];  Service: Urology;  Laterality: Left;  . Cystoscopy/retrograde/ureteroscopy/stone extraction with basket  06/13/2012    Procedure: CYSTOSCOPY/RETROGRADE/URETEROSCOPY/STONE EXTRACTION WITH BASKET;  Surgeon: Alexis Frock, MD;  Location: [facility];  Service: Urology;  Laterality: Left;  . Cystoscopy w/ ureteral stent placement  06/13/2012    Procedure: CYSTOSCOPY WITH STENT REPLACEMENT;  Surgeon: Alexis Frock, MD;  Location: [facility];  Service: Urology;  Laterality: Left;  . Appendectomy      PT STATES PER XRAY SMALL AMOUNT OF APPENDIX LEFT  . Cystoscopy w/ ureteral stent removal  07/11/2012    Procedure: CYSTOSCOPY WITH STENT REMOVAL;  Surgeon: Alexis Frock, MD;  Location: [facility];  Service: Urology;  Laterality: Left;  . Cystoscopy w/ retrogrades  07/11/2012    Procedure: CYSTOSCOPY WITH RETROGRADE PYELOGRAM;  Surgeon: Alexis Frock, MD;  Location: [facility];  Service: Urology;  Laterality: Left;  left third stage ureteroscopystone manipulation  . Ureteroscopy  07/11/2012    Procedure: URETEROSCOPY;  Surgeon: Alexis Frock, MD;  Location: [facility];  Service: Urology;  Laterality: Left;  . Joint replacement     Social History:   reports that she has never smoked. She has never used smokeless tobacco. She reports that she does not drink alcohol or use illicit drugs.  Family History  Problem Relation Age of Onset  . Other      Pt unaware due to psych condition  .  Heart attack Father     Medications:   Medication List       This list is accurate as of: 01/10/15  2:10 PM.  Always use your most recent med list.               acetaminophen 325 MG tablet  Commonly known as:  TYLENOL  Take 650 mg by mouth every 6 (six) hours as needed for mild pain.     benztropine 0.5 MG tablet  Commonly known as:  COGENTIN  Take 1 tablet (0.5 mg total) by mouth 2 (two) times daily.     Cyanocobalamin 1000 MCG/ML Kit  Inject 1,000 mLs as directed every 30 (thirty) days.     DELSYM 30 MG/5ML liquid  Generic drug:  dextromethorphan  Take 60 mg by mouth every 12 (twelve) hours as needed for cough.     diltiazem 180 MG 24 hr capsule  Commonly known as:  DILACOR XR  Take 180 mg by mouth daily.     docusate sodium 100 MG capsule  Commonly known as:  COLACE  Take 100 mg by mouth daily as needed for mild constipation.     furosemide 40 MG tablet  Commonly known as:  LASIX  Take 40 mg by mouth daily.     MULTIVITAMIN PO  Take 1 tablet by mouth daily.     omeprazole 20 MG capsule  Commonly known as:  PRILOSEC  Take 20 mg by mouth daily.     polyethylene glycol packet  Commonly known as:  MIRALAX / GLYCOLAX  Take 17 g by mouth daily.     risperiDONE 2 MG disintegrating tablet  Commonly known as:  RISPERDAL M-TABS  Take 1 tablet (2 mg total) by mouth at bedtime.     rivaroxaban 20 MG Tabs tablet  Commonly known as:  XARELTO  Take 20 mg by mouth daily.     Vitamin D3 2000 UNITS capsule  Take 2,000 Units by mouth daily.         Physical Exam: Filed Vitals:   01/10/15 1406  BP: 100/62  Pulse: 87  Temp: 99.4 F (37.4 C)  TempSrc: Oral  Resp: 18  Height: _0  (1.549 m)  Weight: 214 lb (97.07 kg)  SpO2: 96%    General- elderly female, in no acute distress Head- normocephalic, atraumatic Throat- moist mucus membrane  Eyes- PERRLA, EOMI, no pallor, no icterus, no discharge, normal conjunctiva, normal sclera Neck- no cervical  lymphadenopathy Cardiovascular- normal s1,s2, no murmurs, trace leg edema Respiratory- bilateral clear to auscultation, no wheeze, no rhonchi, no crackles, no use of accessory muscles Abdomen- bowel sounds present, soft, non tender, no guarding/rigidity Musculoskeletal- able to move all 4 extremities, generalized weakness left > right, right knee- no effusion or erythema noted Neurological- no focal deficit, alert and oriented to person Skin- warm and dry Psychiatry- normal mood and affect    Labs reviewed: Basic Metabolic Panel:  Recent Labs  08/24/14 1104 10/09/14 1733 01/05/15 1417 01/05/15 2005 01/06/15 0051  NA 142 141 141  --  141  K 4.2 4.0 3.4*  --  3.7  CL 106 101 105  --  104  CO2 33*  --  27  --  28  GLUCOSE 93 111* 126*  --  110*  BUN _1 --  16  CREATININE 0.94 0.90 0.94  --  0.90  CALCIUM 9.3  --  9.1  --  9.1  MG  --   --   --  1.8  --    Liver Function Tests:  Recent Labs  04/16/14 0437 04/23/14 1228 01/06/15 1145  AST 14 35 18  ALT 10 34 12*  ALKPHOS 72 80 57  BILITOT 0.5 <0.2* 0.6  PROT 5.7* 6.3 5.6*  ALBUMIN 3.3* 3.5 3.1*    Recent Labs  04/23/14 1228  LIPASE 41    Recent Labs  02/16/14 2053  AMMONIA 31   CBC:  Recent Labs  04/15/14 0950  04/20/14 0533 04/23/14 1228 10/09/14 1733 01/05/15 1417  WBC 4.3  < > 4.1 3.2*  --  7.0  NEUTROABS 2.5  --   --  1.6*  --  5.6  HGB 13.7  < > 12.6 13.7 13.9 12.7  HCT 42.1  < > 40.2 42.6 41.0 38.6  MCV 95.7  < > 97.8 95.5  --  99.7  PLT 145*  < > 161 166  --  155  < > = values in this interval not displayed. Cardiac Enzymes:  Recent Labs  01/05/15 1417 01/05/15 2005 01/06/15 0051  TROPONINI 0.07* 0.22* 0.18*   BNP: Invalid input(s): POCBNP CBG:  Recent Labs  01/06/15 0534 01/07/15 0602 01/07/15 0751  GLUCAP 100* 97 89    Radiological Exams: Ct Head Wo Contrast  01/05/2015   CLINICAL DATA:  Fell wall walking down the hall. Altered mental status  EXAM: CT HEAD  WITHOUT CONTRAST  TECHNIQUE: Contiguous axial images were obtained from the base of the skull through the vertex without intravenous contrast.  COMPARISON:  04/16/2014  FINDINGS: There is no evidence of mass effect, midline shift, or extra-axial fluid collections. There is no evidence of a space-occupying lesion or intracranial hemorrhage. There is no evidence of a cortical-based area of acute infarction. There is generalized cerebral atrophy. There is periventricular white matter low attenuation likely secondary to microangiopathy.  The ventricles and sulci are appropriate for the patient's age. The basal cisterns are patent.  Visualized portions of the orbits are unremarkable. The visualized portions of the paranasal sinuses and mastoid air cells are unremarkable.  The osseous structures are unremarkable.  IMPRESSION: 1. No acute intracranial pathology. 2. Chronic microvascular disease and cerebral atrophy.   Electronically Signed   By: Kathreen Devoid   On: 01/05/2015 15:53   Dg Knee Complete 4 Views Right  01/05/2015   CLINICAL DATA:  Fall wall walking down the hallway today. Anterior right knee pain. Initial encounter.  EXAM: RIGHT KNEE - COMPLETE 4+ VIEW  COMPARISON:  None.  FINDINGS: Total knee arthroplasty is seen with all 3 components in expected position. There is no evidence of fracture, dislocation, or joint effusion. No other focal bone lesion identified. Generalized osteopenia noted. Soft tissues are unremarkable.  IMPRESSION: No acute findings.  Total knee arthroplasty and osteopenia.   Electronically Signed   By: Earle Gell M.D.   On: 01/05/2015 18:07   Dg Abd 2 Views  01/05/2015   CLINICAL DATA:  Fall.  Left upper quadrant pain.  EXAM: ABDOMEN - 2 VIEW  COMPARISON:  04/23/2014  FINDINGS: The bowel gas pattern is normal. There is no evidence of free air. No radio-opaque calculi or other significant radiographic abnormality is seen.  IMPRESSION: Negative.   Electronically Signed   By: Kerby Moors M.D.   On: 01/05/2015 18:08    Assessment/Plan  Generalized weakness Will have patient work with PT/OT as tolerated to regain strength and restore function.  Fall precautions are in place.  afib Chronic, stable, continue diltiazem 180 mg daily and xarelto for now, family aware of potential harm from [place] with her advanced dementia and high fall risk. Monitor for signs of bleeding  Right knee pain None at present, supportive care with prn tylenol for pain  Advanced dementia with behavioral disturbance Calm this visit. Continue risperidone 2 mg daily, seroquel and depakote taken off by psych in hospital, supportive measures, assistance with ADLs, fall and pressure ulcer precautions. Continue her vitamin supplements  History of pulmonary embolism Diagnosed in 12/15. Continue xarelto for now, breathing stable  Right lung nodule Stable, pulmonary follow up  Constipation Stable, have her on colace 100 mg bid for now with miralax daily and monitor  chf Ef 55-60%, continue lasix 40 mg daily  gerd Stable, continue prilosec 20 mg daily  EPS Continue cogentin 0.5 mg bid for now and monitor   Goals of care: short term rehabilitation, possible long term care   Labs/tests ordered: cbc in 1 week  Family/ staff Communication: reviewed care plan with patient and nursing supervisor    Blanchie Serve, MD  [facility] (Monday-Friday 8 am - 5 pm) [phone] (afterhours)

## 2015-01-17 DIAGNOSIS — R911 Solitary pulmonary nodule: Secondary | ICD-10-CM | POA: Insufficient documentation

## 2015-02-10 ENCOUNTER — Non-Acute Institutional Stay (SKILLED_NURSING_FACILITY): Payer: Medicare Other | Admitting: Adult Health

## 2015-02-10 ENCOUNTER — Encounter: Payer: Self-pay | Admitting: Adult Health

## 2015-02-10 DIAGNOSIS — I480 Paroxysmal atrial fibrillation: Secondary | ICD-10-CM | POA: Diagnosis not present

## 2015-02-10 DIAGNOSIS — G259 Extrapyramidal and movement disorder, unspecified: Secondary | ICD-10-CM

## 2015-02-10 DIAGNOSIS — K5901 Slow transit constipation: Secondary | ICD-10-CM

## 2015-02-10 DIAGNOSIS — E46 Unspecified protein-calorie malnutrition: Secondary | ICD-10-CM | POA: Diagnosis not present

## 2015-02-10 DIAGNOSIS — F0391 Unspecified dementia with behavioral disturbance: Secondary | ICD-10-CM | POA: Diagnosis not present

## 2015-02-10 DIAGNOSIS — K219 Gastro-esophageal reflux disease without esophagitis: Secondary | ICD-10-CM | POA: Diagnosis not present

## 2015-02-10 DIAGNOSIS — M25561 Pain in right knee: Secondary | ICD-10-CM | POA: Diagnosis not present

## 2015-02-10 DIAGNOSIS — I2699 Other pulmonary embolism without acute cor pulmonale: Secondary | ICD-10-CM | POA: Diagnosis not present

## 2015-02-10 DIAGNOSIS — I509 Heart failure, unspecified: Secondary | ICD-10-CM | POA: Diagnosis not present

## 2015-02-10 DIAGNOSIS — R911 Solitary pulmonary nodule: Secondary | ICD-10-CM | POA: Diagnosis not present

## 2015-02-10 DIAGNOSIS — R531 Weakness: Secondary | ICD-10-CM

## 2015-02-12 NOTE — Progress Notes (Signed)
Patient ID: Monica Strong, female   DOB: 1932/04/28, 81 y.o.   MRN: [id number]    DATE:  02/10/15 MRN:  [id number]  BIRTHDAY: Apr 26, 1932  Facility:  [facility]ing Home Location:  [place] Room Number: 762-2  LEVEL OF CARE:  SNF [id number]  Contact Information    Name Relation Home Work Mobile   McSwain,[name] Daughter [phone]  [phone]   McSwain,Tim Other [phone]       Chief Complaint  Patient presents with  . Discharge Note    Generalized weakness, atrial fibrillation, right knee pain, dementia with behavioral disturbance, history of pulmonary embolism, right lung nodule, constipation, CHF, GERD and EPS    HISTORY OF PRESENT ILLNESS:  This is an 81 year old female who is for discharge home with home health PT for endurance, OT for ADLs, nursing for medication management and CNA for showers. DME:  Bedside commode, patient has huge hips. She has been admitted to [facility] on 01/07/15 from [facility]. She has PMH of atrial fibrillation and history of PE on chronic anticoagulation. She had an unwitnessed fall at her memory care unit ALF. CT head was negative. She had right knee pain but x-ray is negative for fracture. Psychiatric consultation was done and her Seroquel and Depakote were discontinued. Family wanted her anticoagulation continued and understands that patient is a high fall risk.  Patient was admitted to this facility for short-term rehabilitation after the patient's recent hospitalization.  Patient has completed SNF rehabilitation and therapy has cleared the patient for discharge.  PAST MEDICAL HISTORY:  Past Medical History  Diagnosis Date  . GERD (gastroesophageal reflux disease)   . H/O hiatal hernia   . Pulmonary nodules BENIGN--  MONITORED BY PCP DR READE--  ASYMPTOMATIC     LAST CHEST CT 08-17-2011  . Heart murmur MILD -- ASYMPTOMATIC  . Urge urinary incontinence   . Sinus drainage   .  Neuromuscular disorder     rt hand numbness  . Dysuria     has urethral bump  . Ureteral stent retained   . Paranoia   . Obesity   . Chest pain     a. Normal stress test 08/2008 and CTA neg for PE at that time.  Jola Baptist ear dysfunction     a. Prior h/o vertigo.  Marland Kitchen History of infection due to ESBL Escherichia coli 02/23/2014  . Hypertension   . Atrial fibrillation   . Pulmonary embolism ~ 05/2014  . OSA (obstructive sleep apnea)     not using cpap (01/05/2015)  . Arthritis     "hands" (01/05/2015)  . Renal stones left  . Psychotic episode     "was hospitalized at [facility] 07/2014; was having hallucinations; on RX now" (01/05/2015)  . Poor short term memory     CURRENT MEDICATIONS: Reviewed  Patient's Medications  New Prescriptions   No medications on file  Previous Medications   ACETAMINOPHEN (TYLENOL) 325 MG TABLET    Take 650 mg by mouth every 6 (six) hours as needed for mild pain.   BENZTROPINE (COGENTIN) 0.5 MG TABLET    Take 1 tablet (0.5 mg total) by mouth 2 (two) times daily.   CHOLECALCIFEROL (VITAMIN D3) 2000 UNITS CAPSULE    Take 2,000 Units by mouth daily.   CYANOCOBALAMIN 1000 MCG/ML KIT    Inject 1,000 mLs as directed every 30 (thirty) days.   DEXTROMETHORPHAN (DELSYM) 30 MG/5ML LIQUID    Take 60 mg by mouth  every 12 (twelve) hours as needed for cough.   DILTIAZEM (DILACOR XR) 180 MG 24 HR CAPSULE    Take 180 mg by mouth daily.   DOCUSATE SODIUM (COLACE) 100 MG CAPSULE    Take 100 mg by mouth daily as needed for mild constipation.   FUROSEMIDE (LASIX) 40 MG TABLET    Take 40 mg by mouth daily.   MULTIPLE VITAMINS-MINERALS (MULTIVITAMIN PO)    Take 1 tablet by mouth daily.   OMEPRAZOLE (PRILOSEC) 20 MG CAPSULE    Take 20 mg by mouth daily.   POLYETHYLENE GLYCOL (MIRALAX / GLYCOLAX) PACKET    Take 17 g by mouth daily.   RISPERIDONE (RISPERDAL M-TABS) 2 MG DISINTEGRATING TABLET    Take 1 tablet (2 mg total) by mouth at bedtime.   RIVAROXABAN (XARELTO) 20 MG TABS TABLET     Take 20 mg by mouth daily.  Modified Medications   No medications on file  Discontinued Medications   No medications on file     Allergies  Allergen Reactions  . Diphenhydramine Nausea Only    Sick to stomach  . Flagyl [Metronidazole] Other (See Comments)    HALLUCINATIONS  . Hydrocodone Other (See Comments)    HALLUCINATIONS  . Meperidine Hcl Nausea And Vomiting  . Penicillins Hives  . Sulfonamide Derivatives Hives     REVIEW OF SYSTEMS:  GENERAL: no change in appetite, no fatigue, no weight changes, no fever, chills or weakness EYES: Denies change in vision, dry eyes, eye pain, itching or discharge EARS: Denies change in hearing, ringing in ears, or earache NOSE: Denies nasal congestion or epistaxis MOUTH and THROAT: Denies oral discomfort, gingival pain or bleeding, pain from teeth or hoarseness   RESPIRATORY: no cough, SOB, DOE, wheezing, hemoptysis CARDIAC: no chest pain, edema or palpitations GI: no abdominal pain, diarrhea, constipation, heart burn, nausea or vomiting GU: Denies dysuria, frequency, hematuria, incontinence, or discharge PSYCHIATRIC: Denies feeling of depression or anxiety. No report of hallucinations, insomnia, paranoia, or agitation   PHYSICAL EXAMINATION  GENERAL APPEARANCE: Well nourished. In no acute distress. Obese HEAD: Normal in size and contour. No evidence of trauma EYES: Lids open and close normally. No blepharitis, entropion or ectropion. PERRL. Conjunctivae are clear and sclerae are white. Lenses are without opacity EARS: Pinnae are normal. Patient hears normal voice tunes of the examiner MOUTH and THROAT: Lips are without lesions. Oral mucosa is moist and without lesions. Tongue is normal in shape, size, and color and without lesions NECK: supple, trachea midline, no neck masses, no thyroid tenderness, no thyromegaly LYMPHATICS: no LAN in the neck, no supraclavicular LAN RESPIRATORY: breathing is even & unlabored, BS CTAB CARDIAC:  RRR, no murmur,no extra heart sounds, no edema GI: abdomen soft, normal BS, no masses, no tenderness, no hepatomegaly, no splenomegaly EXTREMITIES:  Able to move 4 extremities PSYCHIATRIC: Alert and oriented X 3. Affect and behavior are appropriate  LABS/RADIOLOGY: Labs reviewed: 01/17/15  WBC 5.5 hemoglobin 12.4 hematocrit 35.6 MCV 95.2 platelet count 177 Basic Metabolic Panel:  Recent Labs  45/49/60 1104 10/09/14 1733 01/05/15 1417 01/05/15 2005 01/06/15 0051  NA 142 141 141  --  141  K 4.2 4.0 3.4*  --  3.7  CL 106 101 105  --  104  CO2 33*  --  27  --  28  GLUCOSE 93 111* 126*  --  110*  BUN 19 22 16   --  16  CREATININE 0.94 0.90 0.94  --  0.90  CALCIUM 9.3  --  9.1  --  9.1  MG  --   --   --  1.8  --    Liver Function Tests:  Recent Labs  04/16/14 0437 04/23/14 1228 01/06/15 1145  AST 14 35 18  ALT 10 34 12*  ALKPHOS 72 80 57  BILITOT 0.5 <0.2* 0.6  PROT 5.7* 6.3 5.6*  ALBUMIN 3.3* 3.5 3.1*    Recent Labs  04/23/14 1228  LIPASE 41    Recent Labs  02/16/14 2053  AMMONIA 31   CBC:  Recent Labs  04/15/14 0950  04/20/14 0533 04/23/14 1228 10/09/14 1733 01/05/15 1417  WBC 4.3  < > 4.1 3.2*  --  7.0  NEUTROABS 2.5  --   --  1.6*  --  5.6  HGB 13.7  < > 12.6 13.7 13.9 12.7  HCT 42.1  < > 40.2 42.6 41.0 38.6  MCV 95.7  < > 97.8 95.5  --  99.7  PLT 145*  < > 161 166  --  155  < > = values in this interval not displayed.   Cardiac Enzymes:  Recent Labs  01/05/15 1417 01/05/15 2005 01/06/15 0051  TROPONINI 0.07* 0.22* 0.18*   CBG:  Recent Labs  01/06/15 0534 01/07/15 0602 01/07/15 0751  GLUCAP 100* 97 89     ASSESSMENT/PLAN:  Generalized weakness - for home health PT, OT, CNA and Nursing  Atrial fibrillation - rate controlled; continue diltiazem 180 mg ER 1 by mouth daily and Xarelto 20 mg by mouth daily; family understands that patient is at high fall risk  Right knee pain - well-controlled; continue Tylenol when necessary  and supportive care  Dementia with behavioral disturbance - continue risperidone 2 mg 1 tab by mouth daily; Seroquel and Depakote were recently discontinued due to increased sedation  History of pulmonary embolism - continue Xarelto 20 mg by mouth daily; no SOB; family would like to continue anticoagulation and understands that patient is at high fall risk  Right lung nodule - stable; pulmonary follow-up  Constipation - continue Colace 100 mg by mouth twice a day and MiraLAX 17 g by mouth daily  CHF - no SOB; continue Lasix 40 mg by mouth daily  GERD - continue Prilosec 20 mg 1 tab by mouth daily  EPS - continue Cogentin 0.5 mg by mouth twice a day      I have filled out patient's discharge paperwork and written prescriptions.  Patient will receive home health PT, OT, Nursing and CNA.  DME provided:  Bedside commode; patient has huge hips  Total discharge time: Greater than 30 minutes  Discharge time involved coordination of the discharge process with social worker, nursing staff and therapy department. Medical justification for home health services/DME verified.   [place], NP [employer] [phone]

## 2015-04-06 ENCOUNTER — Encounter: Payer: Self-pay | Admitting: Internal Medicine

## 2016-03-17 ENCOUNTER — Emergency Department (HOSPITAL_COMMUNITY): Payer: Medicare Other

## 2016-03-17 ENCOUNTER — Inpatient Hospital Stay (HOSPITAL_COMMUNITY)
Admission: EM | Admit: 2016-03-17 | Discharge: 2016-03-20 | DRG: 690 | Disposition: A | Discharge status: Skilled Nursing Facility | Payer: Medicare Other | Attending: Family Medicine | Admitting: Family Medicine

## 2016-03-17 ENCOUNTER — Encounter (HOSPITAL_COMMUNITY): Payer: Self-pay | Admitting: Emergency Medicine

## 2016-03-17 DIAGNOSIS — Z96653 Presence of artificial knee joint, bilateral: Secondary | ICD-10-CM | POA: Diagnosis present

## 2016-03-17 DIAGNOSIS — Z86711 Personal history of pulmonary embolism: Secondary | ICD-10-CM

## 2016-03-17 DIAGNOSIS — N390 Urinary tract infection, site not specified: Secondary | ICD-10-CM | POA: Diagnosis present

## 2016-03-17 DIAGNOSIS — F0391 Unspecified dementia with behavioral disturbance: Secondary | ICD-10-CM | POA: Diagnosis not present

## 2016-03-17 DIAGNOSIS — R4182 Altered mental status, unspecified: Secondary | ICD-10-CM | POA: Diagnosis present

## 2016-03-17 DIAGNOSIS — R531 Weakness: Secondary | ICD-10-CM

## 2016-03-17 DIAGNOSIS — F0390 Unspecified dementia without behavioral disturbance: Secondary | ICD-10-CM | POA: Diagnosis present

## 2016-03-17 DIAGNOSIS — Y92129 Unspecified place in nursing home as the place of occurrence of the external cause: Secondary | ICD-10-CM

## 2016-03-17 DIAGNOSIS — F22 Delusional disorders: Secondary | ICD-10-CM | POA: Diagnosis present

## 2016-03-17 DIAGNOSIS — S0101XA Laceration without foreign body of scalp, initial encounter: Secondary | ICD-10-CM | POA: Diagnosis present

## 2016-03-17 DIAGNOSIS — R5381 Other malaise: Secondary | ICD-10-CM | POA: Diagnosis present

## 2016-03-17 DIAGNOSIS — Z7901 Long term (current) use of anticoagulants: Secondary | ICD-10-CM | POA: Diagnosis not present

## 2016-03-17 DIAGNOSIS — I872 Venous insufficiency (chronic) (peripheral): Secondary | ICD-10-CM | POA: Diagnosis not present

## 2016-03-17 DIAGNOSIS — I10 Essential (primary) hypertension: Secondary | ICD-10-CM | POA: Diagnosis present

## 2016-03-17 DIAGNOSIS — I878 Other specified disorders of veins: Secondary | ICD-10-CM | POA: Diagnosis present

## 2016-03-17 DIAGNOSIS — S0191XA Laceration without foreign body of unspecified part of head, initial encounter: Secondary | ICD-10-CM

## 2016-03-17 DIAGNOSIS — R0782 Intercostal pain: Secondary | ICD-10-CM | POA: Diagnosis not present

## 2016-03-17 DIAGNOSIS — R0789 Other chest pain: Secondary | ICD-10-CM | POA: Diagnosis not present

## 2016-03-17 DIAGNOSIS — I4891 Unspecified atrial fibrillation: Secondary | ICD-10-CM | POA: Diagnosis present

## 2016-03-17 DIAGNOSIS — K219 Gastro-esophageal reflux disease without esophagitis: Secondary | ICD-10-CM | POA: Diagnosis present

## 2016-03-17 DIAGNOSIS — I482 Chronic atrial fibrillation: Secondary | ICD-10-CM | POA: Diagnosis present

## 2016-03-17 DIAGNOSIS — Z6838 Body mass index (BMI) 38.0-38.9, adult: Secondary | ICD-10-CM | POA: Diagnosis not present

## 2016-03-17 DIAGNOSIS — Z79899 Other long term (current) drug therapy: Secondary | ICD-10-CM | POA: Diagnosis not present

## 2016-03-17 DIAGNOSIS — W19XXXA Unspecified fall, initial encounter: Secondary | ICD-10-CM | POA: Diagnosis present

## 2016-03-17 DIAGNOSIS — S0990XA Unspecified injury of head, initial encounter: Secondary | ICD-10-CM

## 2016-03-17 DIAGNOSIS — F29 Unspecified psychosis not due to a substance or known physiological condition: Secondary | ICD-10-CM | POA: Diagnosis present

## 2016-03-17 DIAGNOSIS — M940 Chondrocostal junction syndrome [Tietze]: Secondary | ICD-10-CM | POA: Diagnosis present

## 2016-03-17 DIAGNOSIS — R079 Chest pain, unspecified: Secondary | ICD-10-CM

## 2016-03-17 DIAGNOSIS — G4733 Obstructive sleep apnea (adult) (pediatric): Secondary | ICD-10-CM | POA: Diagnosis present

## 2016-03-17 DIAGNOSIS — N3000 Acute cystitis without hematuria: Secondary | ICD-10-CM | POA: Diagnosis not present

## 2016-03-17 DIAGNOSIS — M79606 Pain in leg, unspecified: Secondary | ICD-10-CM

## 2016-03-17 LAB — COMPREHENSIVE METABOLIC PANEL
ALBUMIN: 3.4 g/dL — AB (ref 3.5–5.0)
ALT: 12 U/L — ABNORMAL LOW (ref 14–54)
ANION GAP: 6 (ref 5–15)
AST: 16 U/L (ref 15–41)
Alkaline Phosphatase: 69 U/L (ref 38–126)
BILIRUBIN TOTAL: 0.6 mg/dL (ref 0.3–1.2)
BUN: 19 mg/dL (ref 6–20)
CHLORIDE: 109 mmol/L (ref 101–111)
CO2: 25 mmol/L (ref 22–32)
Calcium: 9.5 mg/dL (ref 8.9–10.3)
Creatinine, Ser: 0.95 mg/dL (ref 0.44–1.00)
GFR calc Af Amer: >60 mL/min (ref >60)
GFR calc non Af Amer: 54 mL/min — ABNORMAL LOW (ref >60)
GLUCOSE: 110 mg/dL — AB (ref 65–99)
POTASSIUM: 4.1 mmol/L (ref 3.5–5.1)
SODIUM: 140 mmol/L (ref 135–145)
TOTAL PROTEIN: 5.9 g/dL — AB (ref 6.5–8.1)

## 2016-03-17 LAB — CBC WITH DIFFERENTIAL/PLATELET
BASOS PCT: 0 %
Basophils Absolute: 0 10*3/uL (ref 0.0–0.1)
Eosinophils Absolute: 0 10*3/uL (ref 0.0–0.7)
Eosinophils Relative: 0 %
HEMATOCRIT: 42.7 % (ref 36.0–46.0)
HEMOGLOBIN: 13.6 g/dL (ref 12.0–15.0)
Lymphocytes Relative: 22 %
Lymphs Abs: 1.7 10*3/uL (ref 0.7–4.0)
MCH: 31.6 pg (ref 26.0–34.0)
MCHC: 31.9 g/dL (ref 30.0–36.0)
MCV: 99.3 fL (ref 78.0–100.0)
MONOS PCT: 8 %
Monocytes Absolute: 0.6 10*3/uL (ref 0.1–1.0)
NEUTROS ABS: 5.4 10*3/uL (ref 1.7–7.7)
NEUTROS PCT: 70 %
Platelets: 174 10*3/uL (ref 150–400)
RBC: 4.3 MIL/uL (ref 3.87–5.11)
RDW: 13.9 % (ref 11.5–15.5)
WBC: 7.7 10*3/uL (ref 4.0–10.5)

## 2016-03-17 LAB — URINALYSIS, ROUTINE W REFLEX MICROSCOPIC
BILIRUBIN URINE: NEGATIVE
Glucose, UA: NEGATIVE mg/dL
HGB URINE DIPSTICK: NEGATIVE
Ketones, ur: NEGATIVE mg/dL
NITRITE: POSITIVE — AB
PROTEIN: NEGATIVE mg/dL
SPECIFIC GRAVITY, URINE: 1.015 (ref 1.005–1.030)
pH: 7.5 (ref 5.0–8.0)

## 2016-03-17 LAB — URINE MICROSCOPIC-ADD ON: RBC / HPF: NONE SEEN RBC/hpf (ref 0–5)

## 2016-03-17 LAB — PROTIME-INR
INR: 1.55
Prothrombin Time: 18.7 seconds — ABNORMAL HIGH (ref 11.4–15.2)

## 2016-03-17 LAB — TSH: TSH: 0.798 u[IU]/mL (ref 0.350–4.500)

## 2016-03-17 LAB — VITAMIN B12: VITAMIN B 12: 409 pg/mL (ref 180–914)

## 2016-03-17 LAB — I-STAT CG4 LACTIC ACID, ED: Lactic Acid, Venous: 1.58 mmol/L (ref 0.5–1.9)

## 2016-03-17 MED ORDER — LIDOCAINE-EPINEPHRINE (PF) 2 %-1:200000 IJ SOLN
20.0000 mL | Freq: Once | INTRAMUSCULAR | Status: DC
  Filled 2016-03-17: qty 20

## 2016-03-17 MED ORDER — FAMOTIDINE 20 MG PO TABS
20.0000 mg | ORAL_TABLET | Freq: Two times a day (BID) | ORAL | Status: DC
  Administered 2016-03-18 – 2016-03-20 (×5): 20 mg via ORAL
  Filled 2016-03-17 (×6): qty 1

## 2016-03-17 MED ORDER — DILTIAZEM HCL ER COATED BEADS 180 MG PO CP24
180.0000 mg | ORAL_CAPSULE | Freq: Every day | ORAL | Status: DC
  Administered 2016-03-17 – 2016-03-18 (×2): 180 mg via ORAL
  Filled 2016-03-17 (×4): qty 1

## 2016-03-17 MED ORDER — HALOPERIDOL LACTATE 5 MG/ML IJ SOLN
4.0000 mg | INTRAMUSCULAR | Status: DC | PRN
  Administered 2016-03-17: 4 mg via INTRAMUSCULAR
  Filled 2016-03-17: qty 1

## 2016-03-17 MED ORDER — RIVAROXABAN 20 MG PO TABS
20.0000 mg | ORAL_TABLET | Freq: Every day | ORAL | Status: DC
  Administered 2016-03-17 – 2016-03-19 (×3): 20 mg via ORAL
  Filled 2016-03-17 (×3): qty 1

## 2016-03-17 MED ORDER — BENZTROPINE MESYLATE 1 MG PO TABS
0.5000 mg | ORAL_TABLET | Freq: Two times a day (BID) | ORAL | Status: DC
  Administered 2016-03-18 – 2016-03-20 (×5): 0.5 mg via ORAL
  Filled 2016-03-17 (×6): qty 1

## 2016-03-17 MED ORDER — RISPERIDONE 0.5 MG PO TABS
0.5000 mg | ORAL_TABLET | ORAL | Status: DC
  Administered 2016-03-19 – 2016-03-20 (×2): 0.5 mg via ORAL
  Filled 2016-03-17 (×3): qty 1

## 2016-03-17 MED ORDER — SODIUM CHLORIDE 0.9% FLUSH
3.0000 mL | Freq: Two times a day (BID) | INTRAVENOUS | Status: DC
  Administered 2016-03-17 – 2016-03-20 (×5): 3 mL via INTRAVENOUS

## 2016-03-17 MED ORDER — SODIUM CHLORIDE 0.9 % IV SOLN: 250.0000 mL | INTRAVENOUS | Status: DC | PRN

## 2016-03-17 MED ORDER — SODIUM CHLORIDE 0.9% FLUSH: 3.0000 mL | INTRAVENOUS | Status: DC | PRN

## 2016-03-17 MED ORDER — SERTRALINE HCL 50 MG PO TABS
25.0000 mg | ORAL_TABLET | Freq: Every day | ORAL | Status: DC
  Administered 2016-03-17 – 2016-03-20 (×4): 25 mg via ORAL
  Filled 2016-03-17 (×4): qty 1

## 2016-03-17 MED ORDER — CIPROFLOXACIN HCL 500 MG PO TABS
500.0000 mg | ORAL_TABLET | Freq: Once | ORAL | Status: DC
  Filled 2016-03-17: qty 1

## 2016-03-17 MED ORDER — LIDOCAINE-EPINEPHRINE 1 %-1:100000 IJ SOLN
20.0000 mL | Freq: Once | INTRAMUSCULAR | Status: DC
  Filled 2016-03-17: qty 1

## 2016-03-17 MED ORDER — LORAZEPAM 2 MG/ML IJ SOLN
0.5000 mg | INTRAMUSCULAR | Status: DC | PRN
  Administered 2016-03-20: 0.5 mg via INTRAVENOUS
  Filled 2016-03-17: qty 1

## 2016-03-17 MED ORDER — SODIUM CHLORIDE 0.9 % IV BOLUS (SEPSIS)
500.0000 mL | Freq: Once | INTRAVENOUS | Status: AC
  Administered 2016-03-17: 500 mL via INTRAVENOUS

## 2016-03-17 MED ORDER — ADULT MULTIVITAMIN LIQUID CH
Freq: Every day | ORAL | Status: DC
  Administered 2016-03-17 – 2016-03-18 (×2): 15 mL via ORAL
  Filled 2016-03-17 (×3): qty 15

## 2016-03-17 MED ORDER — POLYETHYLENE GLYCOL 3350 17 G PO PACK
17.0000 g | PACK | Freq: Every day | ORAL | Status: DC
  Administered 2016-03-17 – 2016-03-20 (×4): 17 g via ORAL
  Filled 2016-03-17 (×4): qty 1

## 2016-03-17 MED ORDER — DEXTROMETHORPHAN POLISTIREX ER 30 MG/5ML PO SUER
60.0000 mg | Freq: Two times a day (BID) | ORAL | Status: DC | PRN
  Filled 2016-03-17: qty 10

## 2016-03-17 MED ORDER — HALOPERIDOL 2 MG PO TABS: 4.0000 mg | ORAL_TABLET | ORAL | Status: DC | PRN

## 2016-03-17 MED ORDER — LORAZEPAM 2 MG/ML IJ SOLN: 0.5000 mg | INTRAMUSCULAR | Status: DC

## 2016-03-17 MED ORDER — ACETAMINOPHEN 325 MG PO TABS
650.0000 mg | ORAL_TABLET | Freq: Four times a day (QID) | ORAL | Status: DC | PRN
  Administered 2016-03-18 – 2016-03-19 (×2): 650 mg via ORAL
  Filled 2016-03-17 (×2): qty 2

## 2016-03-17 MED ORDER — CIPROFLOXACIN IN D5W 400 MG/200ML IV SOLN
400.0000 mg | Freq: Two times a day (BID) | INTRAVENOUS | Status: DC
  Administered 2016-03-17 – 2016-03-18 (×2): 400 mg via INTRAVENOUS
  Filled 2016-03-17 (×2): qty 200

## 2016-03-17 MED ORDER — RISPERIDONE 2 MG PO TBDP
2.0000 mg | ORAL_TABLET | Freq: Every day | ORAL | Status: DC
  Administered 2016-03-18 – 2016-03-19 (×2): 2 mg via ORAL
  Filled 2016-03-17 (×5): qty 1

## 2016-03-17 MED ORDER — FUROSEMIDE 20 MG PO TABS
40.0000 mg | ORAL_TABLET | Freq: Every day | ORAL | Status: DC
  Administered 2016-03-17 – 2016-03-20 (×4): 40 mg via ORAL
  Filled 2016-03-17 (×4): qty 2

## 2016-03-17 MED ORDER — VITAMIN D3 25 MCG (1000 UNIT) PO TABS
2000.0000 [IU] | ORAL_TABLET | Freq: Every day | ORAL | Status: DC
  Administered 2016-03-17 – 2016-03-20 (×4): 2000 [IU] via ORAL
  Filled 2016-03-17 (×8): qty 2

## 2016-03-17 NOTE — ED Notes (Signed)
Patient transported to X-ray 

## 2016-03-17 NOTE — ED Provider Notes (Signed)
[place] DEPT Provider Note   CSN: [id number] Arrival date & time: 03/17/16  0805     History   Chief Complaint Chief Complaint  Patient presents with  . Fall  . Head Injury    HPI Monica Strong is a 82 y.o. female.  HPI Patient presents to the emergency room for evaluation of a fall. Patient is a resident of a nursing facility and has dementia and A. fib. She does take oral anticoagulants, Xarelto. According to the EMS report, the patient had an unwitnessed fall. Staff found her with a laceration to the back of her head. Point to EMS the patient complained of shoulder pain. Concerned about possible UTI because of the urinary odor. The patient is awake and alert. She does not know what happened this morning. She does not recall falling. She complains of a headache. She did not complain of any shoulder pain to me. She denied any other pain or injuries. Past Medical History:  Diagnosis Date  . Arthritis    "hands" (01/05/2015)  . Atrial fibrillation ([place])   . Chest pain    a. Normal stress test 08/2008 and CTA neg for PE at that time.  . Dysuria    has urethral bump  . GERD (gastroesophageal reflux disease)   . H/O hiatal hernia   . Heart murmur MILD -- ASYMPTOMATIC  . History of infection due to ESBL Escherichia coli 02/23/2014  . Hypertension   . Inner ear dysfunction    a. Prior h/o vertigo.  Marland Kitchen Neuromuscular disorder (HCC)    rt hand numbness  . Obesity   . OSA (obstructive sleep apnea)    not using cpap (01/05/2015)  . Paranoia ([place])   . Poor short term memory   . Psychotic episode    "was hospitalized at [facility] 07/2014; was having hallucinations; on RX now" (01/05/2015)  . Pulmonary embolism ([place]) ~ 05/2014  . Pulmonary nodules BENIGN--  MONITORED BY PCP DR READE--  ASYMPTOMATIC    LAST CHEST CT 08-17-2011  . Renal stones left  . Sinus drainage   . Ureteral stent retained   . Urge urinary incontinence     Patient Active Problem List   Diagnosis Date  Noted  . Nodule of right lung 01/17/2015  . Elevated troponin 01/05/2015  . Abdominal pain 01/05/2015  . Knee pain, right 01/05/2015  . Dementia 01/05/2015  . Fall 01/05/2015  . Hilar adenopathy 05/15/2014  . Faintness   . PE (pulmonary embolism)   . Thrombocytopenia ([place]) 04/15/2014  . LOC (loss of consciousness) ([place]) 04/15/2014  . Transient loss of consciousness 04/05/2014  . Syncope 04/05/2014  . History of infection due to ESBL Escherichia coli 02/23/2014  . Demand ischemia ([place]) 02/23/2014  . Psychosis 02/22/2014  . Atrial fibrillation ([place]) 02/17/2014  . Altered mental status 02/17/2014  . Paranoia ([place]) 02/17/2014  . Troponin level elevated 02/17/2014  . Unspecified vitamin D deficiency 02/19/2013  . Osteopenia 02/19/2013  . Morbid obesity ([place]) 06/14/2010  . Obstructive sleep apnea 06/14/2010  . GERD 06/14/2010  . DIVERTICULOSIS OF COLON 06/14/2010  . RECTAL BLEEDING 06/14/2010  . COLONIC POLYPS, HX OF 06/14/2010    Past Surgical History:  Procedure Laterality Date  . APPENDECTOMY     PT STATES PER XRAY SMALL AMOUNT OF APPENDIX LEFT  . BLEPHAROPLASTY Bilateral   . CARDIOVASCULAR STRESS TEST  03-13-2007  dr Tressia Miners turner   NORMAL LV SIZE SYSTOLIC FUCTION/ NO ISCHEMIA/ EF 85%  . CATARACT EXTRACTION W/ INTRAOCULAR LENS  IMPLANT, BILATERAL    . CYSTOSCOPY W/ RETROGRADES  07/11/2012   Procedure: CYSTOSCOPY WITH RETROGRADE PYELOGRAM;  Surgeon: Alexis Frock, MD;  Location: [facility];  Service: Urology;  Laterality: Left;  left third stage ureteroscopystone manipulation  . CYSTOSCOPY W/ URETERAL STENT PLACEMENT  06/13/2012   Procedure: CYSTOSCOPY WITH STENT REPLACEMENT;  Surgeon: Alexis Frock, MD;  Location: [facility];  Service: Urology;  Laterality: Left;  . CYSTOSCOPY W/ URETERAL STENT REMOVAL  07/11/2012   Procedure: CYSTOSCOPY WITH STENT REMOVAL;  Surgeon: Alexis Frock, MD;  Location: [facility];  Service: Urology;   Laterality: Left;  . CYSTOSCOPY WITH RETROGRADE PYELOGRAM, URETEROSCOPY AND STENT PLACEMENT  05/14/2012   Procedure: CYSTOSCOPY WITH RETROGRADE PYELOGRAM, URETEROSCOPY AND STENT PLACEMENT;  Surgeon: Alexis Frock, MD;  Location: [facility];  Service: Urology;  Laterality: Left;  1ST STAGE LEFT URETEROSCOPY, LEFT RETROGRADE, DIGITAL URETEROSCOPY,  LEFT STONE REMOVAL WITH ESCAPE BASKET,  STENT PLACEMENt   . CYSTOSCOPY/RETROGRADE/URETEROSCOPY/STONE EXTRACTION WITH BASKET  06/13/2012   Procedure: CYSTOSCOPY/RETROGRADE/URETEROSCOPY/STONE EXTRACTION WITH BASKET;  Surgeon: Alexis Frock, MD;  Location: [facility];  Service: Urology;  Laterality: Left;  . HOLMIUM LASER APPLICATION  [id number]   Procedure: HOLMIUM LASER APPLICATION;  Surgeon: Alexis Frock, MD;  Location: [facility];  Service: Urology;  Laterality: Left;  . HOLMIUM LASER APPLICATION  [id number]   Procedure: HOLMIUM LASER APPLICATION;  Surgeon: Alexis Frock, MD;  Location: [facility];  Service: Urology;  Laterality: Left;  . JOINT REPLACEMENT    . REMOVAL BREAST CYST, BENIGN  1960's  . TONSILLECTOMY    . TOTAL KNEE ARTHROPLASTY Left 10-17-2009  DR Wynelle Link  . TOTAL KNEE ARTHROPLASTY Right 04-25-2009  . TRANSTHORACIC ECHOCARDIOGRAM  08-18-2008   NORMAL LVSF/ EF 55-60%/ MILD MITRAL REGURG .  Marland Kitchen URETEROSCOPY  07/11/2012   Procedure: URETEROSCOPY;  Surgeon: Alexis Frock, MD;  Location: [facility];  Service: Urology;  Laterality: Left;  Marland Kitchen VAGINAL HYSTERECTOMY  1970    OB History    No data available       Home Medications    Prior to Admission medications   Medication Sig Start Date End Date Taking? Authorizing Provider  acetaminophen (TYLENOL) 325 MG tablet Take 650 mg by mouth every 6 (six) hours as needed for mild pain.   Yes Historical Provider, MD  benztropine (COGENTIN) 0.5 MG tablet Take 1 tablet (0.5 mg total) by mouth 2 (two) times daily. 01/07/15   Yes Shanker Kristeen Mans, MD  Cholecalciferol (VITAMIN D3) 2000 UNITS capsule Take 2,000 Units by mouth daily.   Yes Historical Provider, MD  dextromethorphan (DELSYM) 30 MG/5ML liquid Take 60 mg by mouth every 12 (twelve) hours as needed for cough.   Yes Historical Provider, MD  diltiazem (DILACOR XR) 180 MG 24 hr capsule Take 180 mg by mouth daily.   Yes Historical Provider, MD  furosemide (LASIX) 40 MG tablet Take 40 mg by mouth daily.   Yes Historical Provider, MD  Multiple Vitamins-Minerals (MULTIVITAMIN PO) Take 1 tablet by mouth daily.   Yes Historical Provider, MD  polyethylene glycol (MIRALAX / GLYCOLAX) packet Take 17 g by mouth daily. 04/20/14  Yes Oswald Hillock, MD  ranitidine (ZANTAC) 300 MG tablet Take 300 mg by mouth at bedtime.   Yes Historical Provider, MD  risperidone (RISPERDAL M-TABS) 2 MG disintegrating tablet Take 1 tablet (2 mg total) by mouth at bedtime. 01/07/15  Yes Shanker Kristeen Mans, MD  risperiDONE (RISPERDAL)  0.5 MG tablet Take 0.5 mg by mouth every morning.   Yes Historical Provider, MD  rivaroxaban (XARELTO) 20 MG TABS tablet Take 20 mg by mouth daily.   Yes Historical Provider, MD  sertraline (ZOLOFT) 25 MG tablet Take 25 mg by mouth daily.   Yes Historical Provider, MD    Family History Family History  Problem Relation Age of Onset  . Other      Pt unaware due to psych condition  . Heart attack Father     Social History Social History  Substance Use Topics  . Smoking status: Never Smoker  . Smokeless tobacco: Never Used  . Alcohol use No     Allergies   Diphenhydramine; Flagyl [metronidazole]; Hydrocodone; Meperidine hcl; Penicillins; and Sulfonamide derivatives   Review of Systems Review of Systems  All other systems reviewed and are negative.    Physical Exam Updated Vital Signs BP 139/81   Pulse 95   Temp 98.4 F (36.9 C) (Oral)   Resp 15   SpO2 97%   Physical Exam  Constitutional: No distress.  Elderly, frail  HENT:  Head:  Normocephalic.  Right Ear: External ear normal.  Left Ear: External ear normal.  Small laceration right parietal region,   Eyes: Conjunctivae are normal. Right eye exhibits no discharge. Left eye exhibits no discharge. No scleral icterus.  Neck: Neck supple. No tracheal deviation present.  Cardiovascular: Normal rate and intact distal pulses.  An irregularly irregular rhythm present.  Pulmonary/Chest: Effort normal and breath sounds normal. No stridor. No respiratory distress. She has no wheezes. She has no rales.  Abdominal: Soft. Bowel sounds are normal. She exhibits no distension. There is no tenderness. There is no rebound and no guarding.  Musculoskeletal: She exhibits no edema.       Right shoulder: She exhibits no tenderness, no bony tenderness and no swelling.       Left shoulder: She exhibits no tenderness, no bony tenderness and no swelling.       Right wrist: She exhibits no tenderness, no bony tenderness and no swelling.       Left wrist: She exhibits no tenderness, no bony tenderness and no swelling.       Right hip: She exhibits normal range of motion and no swelling.       Left hip: She exhibits normal range of motion, no tenderness and no bony tenderness.       Left knee: She exhibits normal range of motion, no swelling and no effusion. Tenderness found.       Right ankle: She exhibits no swelling. No tenderness.       Left ankle: She exhibits no swelling. No tenderness.       Cervical back: She exhibits no tenderness, no bony tenderness and no swelling.       Thoracic back: She exhibits no tenderness, no bony tenderness and no swelling.       Lumbar back: She exhibits no tenderness, no bony tenderness and no swelling.  Some pain with range of motion of the right hip, questionable shortening;   Neurological: She is alert. She is disoriented. No cranial nerve deficit (no facial droop, extraocular movements intact, no slurred speech) or sensory deficit. She exhibits normal muscle  tone. She displays no seizure activity. Coordination normal.  Patient answers questions appropriately, she is confused, generalized weakness but she does have equal grip strength and plantar flexion bilaterally  Skin: Skin is warm and dry. No rash noted.  Psychiatric: She has  a normal mood and affect.  Nursing note and vitals reviewed.    ED Treatments / Results  Labs (all labs ordered are listed, but only abnormal results are displayed) Labs Reviewed  URINALYSIS, ROUTINE W REFLEX MICROSCOPIC (NOT AT [facility]) - Abnormal; Notable for the following:       Result Value   APPearance CLOUDY (*)    Nitrite POSITIVE (*)    Leukocytes, UA MODERATE (*)    All other components within normal limits  COMPREHENSIVE METABOLIC PANEL - Abnormal; Notable for the following:    Glucose, Bld 110 (*)    Total Protein 5.9 (*)    Albumin 3.4 (*)    ALT 12 (*)    GFR calc non Af Amer 54 (*)    All other components within normal limits  PROTIME-INR - Abnormal; Notable for the following:    Prothrombin Time 18.7 (*)    All other components within normal limits  URINE MICROSCOPIC-ADD ON - Abnormal; Notable for the following:    Squamous Epithelial / LPF 0-5 (*)    Bacteria, UA MANY (*)    All other components within normal limits  URINE CULTURE  URINE CULTURE  CBC WITH DIFFERENTIAL/PLATELET  I-STAT CG4 LACTIC ACID, ED    EKG  EKG Interpretation  Date/Time:  Saturday March 17 2016 08:15:11 EDT Ventricular Rate:  78 PR Interval:    QRS Duration: 100 QT Interval:  378 QTC Calculation: 431 R Axis:   42 Text Interpretation:  Atrial fibrillation Abnormal R-wave progression, early transition Nonspecific repol abnormality, diffuse leads No significant change since last tracing Confirmed by Ferrel Simington  MD-J, Marquest Gunkel ([id number]) on 03/17/2016 9:46:29 AM       Radiology Dg Shoulder Right  Result Date: 03/17/2016 CLINICAL DATA:  Pain following recent fall EXAM: RIGHT SHOULDER - 2+ VIEW COMPARISON:  None. FINDINGS:  Frontal and Y scapular images were obtained. There is no demonstrable fracture or dislocation. There is moderate generalized osteoarthritic change. No erosive change. Visualized right lung is clear. IMPRESSION: Moderate generalized osteoarthritic change. No fracture or dislocation. Electronically Signed   By: Lowella Grip III M.D.   On: 03/17/2016 10:01   Ct Head Wo Contrast  Result Date: 03/17/2016 CLINICAL DATA:  Fall with posterior scalp laceration. History of dementia. Initial encounter. EXAM: CT HEAD WITHOUT CONTRAST CT CERVICAL SPINE WITHOUT CONTRAST TECHNIQUE: Multidetector CT imaging of the head and cervical spine was performed following the standard protocol without intravenous contrast. Multiplanar CT image reconstructions of the cervical spine were also generated. COMPARISON:  CT of the head on 01/05/2015 FINDINGS: CT HEAD FINDINGS Brain: No evidence of acute infarction, hemorrhage, hydrocephalus, extra-axial collection or mass lesion/mass effect. Vascular: No hyperdense vessel or unexpected calcification. Skull: Normal. Negative for fracture or focal lesion. Sinuses/Orbits: No acute finding. Other: Scalp soft tissue swelling over the right lateral vertex. CT CERVICAL SPINE FINDINGS Alignment: Normal. Skull base and vertebrae: No acute fracture. No primary bone lesion or focal pathologic process. Soft tissues and spinal canal: No prevertebral fluid or swelling. No visible canal hematoma. Visualized airway is normally patent. No incidental masses identified. Disc levels: Moderate disc space narrowing and proliferative disease at C5-6. Milder spondylosis at C6-7 with suggestion of minimal anterolisthesis of C6 on C7. Upper chest: Negative. IMPRESSION: 1. Right lateral vertex scalp soft tissue swelling without evidence of acute brain injury or skull fracture. 2. No evidence of acute cervical injury. Moderate cervical spondylosis at C5-6 and milder spondylosis at C6-7. Electronically Signed   By:  Eulas Post  Kathlene Cote M.D.   On: 03/17/2016 10:13   Ct Cervical Spine Wo Contrast  Result Date: 03/17/2016 CLINICAL DATA:  Fall with posterior scalp laceration. History of dementia. Initial encounter. EXAM: CT HEAD WITHOUT CONTRAST CT CERVICAL SPINE WITHOUT CONTRAST TECHNIQUE: Multidetector CT imaging of the head and cervical spine was performed following the standard protocol without intravenous contrast. Multiplanar CT image reconstructions of the cervical spine were also generated. COMPARISON:  CT of the head on 01/05/2015 FINDINGS: CT HEAD FINDINGS Brain: No evidence of acute infarction, hemorrhage, hydrocephalus, extra-axial collection or mass lesion/mass effect. Vascular: No hyperdense vessel or unexpected calcification. Skull: Normal. Negative for fracture or focal lesion. Sinuses/Orbits: No acute finding. Other: Scalp soft tissue swelling over the right lateral vertex. CT CERVICAL SPINE FINDINGS Alignment: Normal. Skull base and vertebrae: No acute fracture. No primary bone lesion or focal pathologic process. Soft tissues and spinal canal: No prevertebral fluid or swelling. No visible canal hematoma. Visualized airway is normally patent. No incidental masses identified. Disc levels: Moderate disc space narrowing and proliferative disease at C5-6. Milder spondylosis at C6-7 with suggestion of minimal anterolisthesis of C6 on C7. Upper chest: Negative. IMPRESSION: 1. Right lateral vertex scalp soft tissue swelling without evidence of acute brain injury or skull fracture. 2. No evidence of acute cervical injury. Moderate cervical spondylosis at C5-6 and milder spondylosis at C6-7. Electronically Signed   By: Aletta Edouard M.D.   On: 03/17/2016 10:13   Dg Knee Complete 4 Views Left  Result Date: 03/17/2016 CLINICAL DATA:  Pain following fall EXAM: LEFT KNEE - COMPLETE 4+ VIEW COMPARISON:  None. FINDINGS: Frontal, lateral, and bilateral oblique views were obtained. Patient is status post total knee  arthroplasty with femoral and tibial prosthetic components appearing well-seated. No acute fracture or dislocation. No knee joint effusion. No erosive change. IMPRESSION: Prosthetic components appear well seated. No acute fracture or dislocation. No erosive change or joint effusion. Electronically Signed   By: Lowella Grip III M.D.   On: 03/17/2016 10:02   Dg Hips Bilat W Or Wo Pelvis 3-4 Views  Result Date: 03/17/2016 CLINICAL DATA:  Pain following fall EXAM: DG HIP (WITH OR WITHOUT PELVIS) 3-4V BILAT COMPARISON:  None. FINDINGS: Frontal pelvis as well as frontal and lateral hip views bilaterally -total five views -obtained. No evident fracture or dislocation. There is mild symmetric narrowing of both hip joints. No erosive change. There is mild osteoarthritic change in each sacroiliac joint. Bones appear somewhat osteoporotic. IMPRESSION: Mild narrowing of both hip joints. Mild osteoarthritic change in each sacroiliac joint. No fracture dislocation. Bones appear somewhat osteoporotic. Electronically Signed   By: Lowella Grip III M.D.   On: 03/17/2016 10:03    Procedures .Marland KitchenLaceration Repair Date/Time: 03/17/2016 11:26 AM Performed by: Dorie Rank Authorized by: Dorie Rank   Consent:    Consent obtained:  Verbal   Consent given by:  Patient   Risks discussed:  Infection, need for additional repair, poor wound healing and pain   Alternatives discussed:  No treatment Anesthesia (see MAR for exact dosages):    Anesthesia method:  None Laceration details:    Location:  Scalp   Length (cm):  0.8 Repair type:    Repair type:  Simple Pre-procedure details:    Preparation:  Patient was prepped and draped in usual sterile fashion Exploration:    Wound extent: no foreign bodies/material noted, no underlying fracture noted and no vascular damage noted   Treatment:    Area cleansed with:  Shur-Clens   Amount  of cleaning:  Standard   Irrigation solution:  Sterile saline   Visualized  foreign bodies/material removed: no   Skin repair:    Repair method:  Staples   Number of staples:  1 Approximation:    Approximation:  Close Post-procedure details:    Patient tolerance of procedure:  Tolerated well, no immediate complications   (including critical care time)  Medications Ordered in ED Medications  sodium chloride 0.9 % bolus 500 mL (not administered)  ciprofloxacin (CIPRO) tablet 500 mg (not administered)     Initial Impression / Assessment and Plan / ED Course  I have reviewed the triage vital signs and the nursing notes.  Pertinent labs & imaging results that were available during my care of the patient were reviewed by me and considered in my medical decision making (see chart for details).  Clinical Course   Patient presented to the emergency room after a fall. Daughter is here and is concerned about her increasing confusion. She also seems to be weak. CT scan does not show any acute injuries. Laboratory tests are suggestive of a urinary tract infection.  Patient lives in an assisted living facility. I am concerned about her having recurrent falls. I will start her on IV fluids, antibiotics and consult the medical service for admission.   Final Clinical Impressions(s) / ED Diagnoses   Final diagnoses:  Lower urinary tract infectious disease  Injury of head, initial encounter  Laceration of scalp, initial encounter  Weakness     Dorie Rank, MD 03/17/16 1356

## 2016-03-17 NOTE — ED Notes (Signed)
Pt still in radiology.

## 2016-03-17 NOTE — ED Notes (Signed)
Attempted report 

## 2016-03-17 NOTE — ED Triage Notes (Signed)
Pt from [facility] via [place] with c/o unwitnessed fall.  Pt has head laceration to back of head with controlled bleeding and intermittently complains of right shoulder pain.  Pt has a smell suggesting possible UTI.  Pt at baseline with hx of dementia and afib per staff.  On Xeralto.  NAD, alert.

## 2016-03-17 NOTE — H&P (Signed)
[facility] History and Physical  Monica Strong [id number] DOB: [birth year] DOA: 03/17/2016  Referring physician: ER PCP: Vena Austria, MD   Chief Complaint: ams  HPI: Monica Strong is a 82 y.o. female  [facility] was found down this morning with a laceration to the back of her head, unwitnessed fall.  There was noted some blood on the area. EMS was called and she was brought into the ED for further evaluation. Daughters here at bedside, and able to give some history. Per daughter, patient has a history of dementia, psychosis and new medications have been adjusted in June/July, including Risperdal, but no new meds since.  She also has a history of untreated sleep apnea as well.  Pt does not recall falling, c/o of possible dysuria, but does not recall. Poor historian. Denies acute pain at this time.  Assessment palpitations. Respiratory no subjective fevers. Denies nausea, vomiting, diarrhea, constipation, hematuria, eats well with no complaints.  Patient has chronic venous stasis syndrome. She sees a dermatologist for her lower extremity skin issues as well per dgt.  Sees home visiting physicians as well.   Review of Systems:  Per hpi, o/w all systems reviewed and negative.  Past Medical History:  Diagnosis Date  . Arthritis    "hands" (01/05/2015)  . Atrial fibrillation ([place])   . Chest pain    a. Normal stress test 08/2008 and CTA neg for PE at that time.  . Dysuria    has urethral bump  . GERD (gastroesophageal reflux disease)   . H/O hiatal hernia   . Heart murmur MILD -- ASYMPTOMATIC  . History of infection due to ESBL Escherichia coli 02/23/2014  . Hypertension   . Inner ear dysfunction    a. Prior h/o vertigo.  Marland Kitchen Neuromuscular disorder (HCC)    rt hand numbness  . Obesity   . OSA (obstructive sleep apnea)    not using cpap (01/05/2015)  . Paranoia ([place])   . Poor short term memory   . Psychotic episode    "was hospitalized at [facility]  07/2014; was having hallucinations; on RX now" (01/05/2015)  . Pulmonary embolism ([place]) ~ 05/2014  . Pulmonary nodules BENIGN--  MONITORED BY PCP DR READE--  ASYMPTOMATIC    LAST CHEST CT 08-17-2011  . Renal stones left  . Sinus drainage   . Ureteral stent retained   . Urge urinary incontinence    Past Surgical History:  Procedure Laterality Date  . APPENDECTOMY     PT STATES PER XRAY SMALL AMOUNT OF APPENDIX LEFT  . BLEPHAROPLASTY Bilateral   . CARDIOVASCULAR STRESS TEST  03-13-2007  dr Tressia Miners turner   NORMAL LV SIZE SYSTOLIC FUCTION/ NO ISCHEMIA/ EF 85%  . CATARACT EXTRACTION W/ INTRAOCULAR LENS  IMPLANT, BILATERAL    . CYSTOSCOPY W/ RETROGRADES  07/11/2012   Procedure: CYSTOSCOPY WITH RETROGRADE PYELOGRAM;  Surgeon: Alexis Frock, MD;  Location: [facility];  Service: Urology;  Laterality: Left;  left third stage ureteroscopystone manipulation  . CYSTOSCOPY W/ URETERAL STENT PLACEMENT  06/13/2012   Procedure: CYSTOSCOPY WITH STENT REPLACEMENT;  Surgeon: Alexis Frock, MD;  Location: [facility];  Service: Urology;  Laterality: Left;  . CYSTOSCOPY W/ URETERAL STENT REMOVAL  07/11/2012   Procedure: CYSTOSCOPY WITH STENT REMOVAL;  Surgeon: Alexis Frock, MD;  Location: [facility];  Service: Urology;  Laterality: Left;  . CYSTOSCOPY WITH RETROGRADE PYELOGRAM, URETEROSCOPY AND STENT PLACEMENT  05/14/2012   Procedure: CYSTOSCOPY WITH RETROGRADE PYELOGRAM, URETEROSCOPY  AND STENT PLACEMENT;  Surgeon: Alexis Frock, MD;  Location: [facility];  Service: Urology;  Laterality: Left;  1ST STAGE LEFT URETEROSCOPY, LEFT RETROGRADE, DIGITAL URETEROSCOPY,  LEFT STONE REMOVAL WITH ESCAPE BASKET,  STENT PLACEMENt   . CYSTOSCOPY/RETROGRADE/URETEROSCOPY/STONE EXTRACTION WITH BASKET  06/13/2012   Procedure: CYSTOSCOPY/RETROGRADE/URETEROSCOPY/STONE EXTRACTION WITH BASKET;  Surgeon: Alexis Frock, MD;  Location: [facility];  Service:  Urology;  Laterality: Left;  . HOLMIUM LASER APPLICATION  [id number]   Procedure: HOLMIUM LASER APPLICATION;  Surgeon: Alexis Frock, MD;  Location: [facility];  Service: Urology;  Laterality: Left;  . HOLMIUM LASER APPLICATION  [id number]   Procedure: HOLMIUM LASER APPLICATION;  Surgeon: Alexis Frock, MD;  Location: [facility];  Service: Urology;  Laterality: Left;  . JOINT REPLACEMENT    . REMOVAL BREAST CYST, BENIGN  1960's  . TONSILLECTOMY    . TOTAL KNEE ARTHROPLASTY Left 10-17-2009  DR Wynelle Link  . TOTAL KNEE ARTHROPLASTY Right 04-25-2009  . TRANSTHORACIC ECHOCARDIOGRAM  08-18-2008   NORMAL LVSF/ EF 55-60%/ MILD MITRAL REGURG .  Marland Kitchen URETEROSCOPY  07/11/2012   Procedure: URETEROSCOPY;  Surgeon: Alexis Frock, MD;  Location: [facility];  Service: Urology;  Laterality: Left;  Marland Kitchen VAGINAL HYSTERECTOMY  1970   Social History:  reports that she has never smoked. She has never used smokeless tobacco. She reports that she does not drink alcohol or use drugs.  Allergies  Allergen Reactions  . Diphenhydramine Nausea Only    Sick to stomach  . Flagyl [Metronidazole] Other (See Comments)    HALLUCINATIONS  . Hydrocodone Other (See Comments)    HALLUCINATIONS  . Meperidine Hcl Nausea And Vomiting  . Penicillins Hives  . Sulfonamide Derivatives Hives    Family History  Problem Relation Age of Onset  . Other      Pt unaware due to psych condition  . Heart attack Father     Prior to Admission medications   Medication Sig Start Date End Date Taking? Authorizing Provider  acetaminophen (TYLENOL) 325 MG tablet Take 650 mg by mouth every 6 (six) hours as needed for mild pain.   Yes Historical Provider, MD  benztropine (COGENTIN) 0.5 MG tablet Take 1 tablet (0.5 mg total) by mouth 2 (two) times daily. 01/07/15  Yes Shanker Kristeen Mans, MD  Cholecalciferol (VITAMIN D3) 2000 UNITS capsule Take 2,000 Units by mouth daily.   Yes Historical Provider, MD    dextromethorphan (DELSYM) 30 MG/5ML liquid Take 60 mg by mouth every 12 (twelve) hours as needed for cough.   Yes Historical Provider, MD  diltiazem (DILACOR XR) 180 MG 24 hr capsule Take 180 mg by mouth daily.   Yes Historical Provider, MD  furosemide (LASIX) 40 MG tablet Take 40 mg by mouth daily.   Yes Historical Provider, MD  Multiple Vitamins-Minerals (MULTIVITAMIN PO) Take 1 tablet by mouth daily.   Yes Historical Provider, MD  polyethylene glycol (MIRALAX / GLYCOLAX) packet Take 17 g by mouth daily. 04/20/14  Yes Oswald Hillock, MD  ranitidine (ZANTAC) 300 MG tablet Take 300 mg by mouth at bedtime.   Yes Historical Provider, MD  risperidone (RISPERDAL M-TABS) 2 MG disintegrating tablet Take 1 tablet (2 mg total) by mouth at bedtime. 01/07/15  Yes Shanker Kristeen Mans, MD  risperiDONE (RISPERDAL) 0.5 MG tablet Take 0.5 mg by mouth every morning.   Yes Historical Provider, MD  rivaroxaban (XARELTO) 20 MG TABS tablet Take 20 mg by mouth daily.   Yes Historical  Provider, MD  sertraline (ZOLOFT) 25 MG tablet Take 25 mg by mouth daily.   Yes Historical Provider, MD   Physical Exam: Vitals:   03/17/16 1230 03/17/16 1330 03/17/16 1400 03/17/16 1430  BP: 123/77 139/81 142/88 133/86  Pulse: 72 95 83 82  Resp: 12 15 18 19   Temp:      TempSrc:      SpO2: 95% 97% 95% 93%    Wt Readings from Last 3 Encounters:  02/10/15 94.3 kg (207 lb 12.8 oz)  01/10/15 97.1 kg (214 lb)  01/07/15 97.5 kg (215 lb)    General:  Appears calm and comfortable,  NAD, Speech is slow, speech is not slurred,  but pleasant, poor historian, disoriented. Gait not assessed Eyes: PERRL, normal lids, irises & conjunctiva ENT: grossly normal hearing, lips & tongue, mmm.  Small laceration, right parietal region, status post staple Neck: no LAD,  Cardiovascular: Irregular irregular rhythm, no m/r/g. Chronic venous stasis changes noted in lower extremities bilaterally Telemetry: SR, no arrhythmias  Respiratory: CTA bilaterally,  no w/r/r. Normal respiratory effort. Abdomen: soft, ntnd, obese. Skin: no rash or induration seen on limited exam Musculoskeletal: grossly normal tone BUE/BLE Psychiatric: slowed speech, confused, but pleasant.  Neurologic: grossly non-focal.          Labs on Admission:  Basic Metabolic Panel:  Recent Labs Lab 03/17/16 0815  NA 140  K 4.1  CL 109  CO2 25  GLUCOSE 110*  BUN 19  CREATININE 0.95  CALCIUM 9.5   Liver Function Tests:  Recent Labs Lab 03/17/16 0815  AST 16  ALT 12*  ALKPHOS 69  BILITOT 0.6  PROT 5.9*  ALBUMIN 3.4*   No results for input(s): LIPASE, AMYLASE in the last 168 hours. No results for input(s): AMMONIA in the last 168 hours. CBC:  Recent Labs Lab 03/17/16 0815  WBC 7.7  NEUTROABS 5.4  HGB 13.6  HCT 42.7  MCV 99.3  PLT 174   Cardiac Enzymes: No results for input(s): CKTOTAL, CKMB, CKMBINDEX, TROPONINI in the last 168 hours.  BNP (last 3 results) No results for input(s): BNP in the last 8760 hours.  ProBNP (last 3 results) No results for input(s): PROBNP in the last 8760 hours.  CBG: No results for input(s): GLUCAP in the last 168 hours.  Radiological Exams on Admission: Dg Shoulder Right  Result Date: 03/17/2016 CLINICAL DATA:  Pain following recent fall EXAM: RIGHT SHOULDER - 2+ VIEW COMPARISON:  None. FINDINGS: Frontal and Y scapular images were obtained. There is no demonstrable fracture or dislocation. There is moderate generalized osteoarthritic change. No erosive change. Visualized right lung is clear. IMPRESSION: Moderate generalized osteoarthritic change. No fracture or dislocation. Electronically Signed   By: Lowella Grip III M.D.   On: 03/17/2016 10:01   Ct Head Wo Contrast  Result Date: 03/17/2016 CLINICAL DATA:  Fall with posterior scalp laceration. History of dementia. Initial encounter. EXAM: CT HEAD WITHOUT CONTRAST CT CERVICAL SPINE WITHOUT CONTRAST TECHNIQUE: Multidetector CT imaging of the head and cervical  spine was performed following the standard protocol without intravenous contrast. Multiplanar CT image reconstructions of the cervical spine were also generated. COMPARISON:  CT of the head on 01/05/2015 FINDINGS: CT HEAD FINDINGS Brain: No evidence of acute infarction, hemorrhage, hydrocephalus, extra-axial collection or mass lesion/mass effect. Vascular: No hyperdense vessel or unexpected calcification. Skull: Normal. Negative for fracture or focal lesion. Sinuses/Orbits: No acute finding. Other: Scalp soft tissue swelling over the right lateral vertex. CT CERVICAL SPINE FINDINGS Alignment: Normal.  Skull base and vertebrae: No acute fracture. No primary bone lesion or focal pathologic process. Soft tissues and spinal canal: No prevertebral fluid or swelling. No visible canal hematoma. Visualized airway is normally patent. No incidental masses identified. Disc levels: Moderate disc space narrowing and proliferative disease at C5-6. Milder spondylosis at C6-7 with suggestion of minimal anterolisthesis of C6 on C7. Upper chest: Negative. IMPRESSION: 1. Right lateral vertex scalp soft tissue swelling without evidence of acute brain injury or skull fracture. 2. No evidence of acute cervical injury. Moderate cervical spondylosis at C5-6 and milder spondylosis at C6-7. Electronically Signed   By: Aletta Edouard M.D.   On: 03/17/2016 10:13   Ct Cervical Spine Wo Contrast  Result Date: 03/17/2016 CLINICAL DATA:  Fall with posterior scalp laceration. History of dementia. Initial encounter. EXAM: CT HEAD WITHOUT CONTRAST CT CERVICAL SPINE WITHOUT CONTRAST TECHNIQUE: Multidetector CT imaging of the head and cervical spine was performed following the standard protocol without intravenous contrast. Multiplanar CT image reconstructions of the cervical spine were also generated. COMPARISON:  CT of the head on 01/05/2015 FINDINGS: CT HEAD FINDINGS Brain: No evidence of acute infarction, hemorrhage, hydrocephalus, extra-axial  collection or mass lesion/mass effect. Vascular: No hyperdense vessel or unexpected calcification. Skull: Normal. Negative for fracture or focal lesion. Sinuses/Orbits: No acute finding. Other: Scalp soft tissue swelling over the right lateral vertex. CT CERVICAL SPINE FINDINGS Alignment: Normal. Skull base and vertebrae: No acute fracture. No primary bone lesion or focal pathologic process. Soft tissues and spinal canal: No prevertebral fluid or swelling. No visible canal hematoma. Visualized airway is normally patent. No incidental masses identified. Disc levels: Moderate disc space narrowing and proliferative disease at C5-6. Milder spondylosis at C6-7 with suggestion of minimal anterolisthesis of C6 on C7. Upper chest: Negative. IMPRESSION: 1. Right lateral vertex scalp soft tissue swelling without evidence of acute brain injury or skull fracture. 2. No evidence of acute cervical injury. Moderate cervical spondylosis at C5-6 and milder spondylosis at C6-7. Electronically Signed   By: Aletta Edouard M.D.   On: 03/17/2016 10:13   Dg Knee Complete 4 Views Left  Result Date: 03/17/2016 CLINICAL DATA:  Pain following fall EXAM: LEFT KNEE - COMPLETE 4+ VIEW COMPARISON:  None. FINDINGS: Frontal, lateral, and bilateral oblique views were obtained. Patient is status post total knee arthroplasty with femoral and tibial prosthetic components appearing well-seated. No acute fracture or dislocation. No knee joint effusion. No erosive change. IMPRESSION: Prosthetic components appear well seated. No acute fracture or dislocation. No erosive change or joint effusion. Electronically Signed   By: Lowella Grip III M.D.   On: 03/17/2016 10:02   Dg Hips Bilat W Or Wo Pelvis 3-4 Views  Result Date: 03/17/2016 CLINICAL DATA:  Pain following fall EXAM: DG HIP (WITH OR WITHOUT PELVIS) 3-4V BILAT COMPARISON:  None. FINDINGS: Frontal pelvis as well as frontal and lateral hip views bilaterally -total five views -obtained. No  evident fracture or dislocation. There is mild symmetric narrowing of both hip joints. No erosive change. There is mild osteoarthritic change in each sacroiliac joint. Bones appear somewhat osteoporotic. IMPRESSION: Mild narrowing of both hip joints. Mild osteoarthritic change in each sacroiliac joint. No fracture dislocation. Bones appear somewhat osteoporotic. Electronically Signed   By: Lowella Grip III M.D.   On: 03/17/2016 10:03    EKG: Independently reviewed.   EKG Interpretation  Date/Time:  Saturday March 17 2016 08:15:11 EDT Ventricular Rate:  78 PR Interval:    QRS Duration: 100 QT Interval:  378 QTC Calculation: 431 R Axis:   42 Text Interpretation:  Atrial fibrillation Abnormal R-wave progression, early transition Nonspecific repol abnormality, diffuse leads No significant change since last tracing Confirmed by KNAPP  MD-J, JON [id number]) on 03/17/2016 9:46:29 AM        Assessment/Plan Principal Problem:   UTI (urinary tract infection) Active Problems:   Laceration of head   Morbid obesity (HCC)   Obstructive sleep apnea   Atrial fibrillation (HCC)   Altered mental state   Psychosis   Dementia   Chronic venous stasis dermatitis   1. uti - present on admission, may be cause of her altered mental status and confusion. - obs tele - neuro chks - emp cipro iv bid - hliv iv since tol po  2. Head laceration, sp unwitnessed fall       - sp staple to laceration in ED.       - ct head/cerv spine no acute findings  3. Chronic atrial fib       - rate controlled, continue Xarelto and diltiaxem  4.  Ams, w/ hx of dementia/psychosis as well - on numerous rx, including congentin and risperdal, continue for now - chk b12/folate/rpr as well - untreated OSA may be confounding things as well.  5. Untreated OSA, currently not on cpap, in setting of morbid obesity - sats good currently, o2 as needed  6. Chronic venous stasis changes - on lasix at home, continued as  well.  7. Debility - pt walks w/ walker, - consult PT eval treat  8. Pt lives in assisted living facility  No c/s for now.  Code Status: full DVT Prophylaxis: scds, on xarelto Family Communication: dgt at bedside Disposition Plan: obs  Time spent: 99mins  Maren Reamer MD., MBA/MHA [facility] Pager [phone]

## 2016-03-18 LAB — CBC
HEMATOCRIT: 45.7 % (ref 36.0–46.0)
HEMOGLOBIN: 14.4 g/dL (ref 12.0–15.0)
MCH: 31.3 pg (ref 26.0–34.0)
MCHC: 31.5 g/dL (ref 30.0–36.0)
MCV: 99.3 fL (ref 78.0–100.0)
Platelets: 196 10*3/uL (ref 150–400)
RBC: 4.6 MIL/uL (ref 3.87–5.11)
RDW: 13.8 % (ref 11.5–15.5)
WBC: 7.5 10*3/uL (ref 4.0–10.5)

## 2016-03-18 LAB — BASIC METABOLIC PANEL
Anion gap: 7 (ref 5–15)
BUN: 16 mg/dL (ref 6–20)
CO2: 28 mmol/L (ref 22–32)
Calcium: 9.2 mg/dL (ref 8.9–10.3)
Chloride: 104 mmol/L (ref 101–111)
Creatinine, Ser: 0.95 mg/dL (ref 0.44–1.00)
GFR calc Af Amer: >60 mL/min (ref >60)
GFR calc non Af Amer: 54 mL/min — ABNORMAL LOW (ref >60)
Glucose, Bld: 101 mg/dL — ABNORMAL HIGH (ref 65–99)
Potassium: 3.5 mmol/L (ref 3.5–5.1)
Sodium: 139 mmol/L (ref 135–145)

## 2016-03-18 LAB — MRSA PCR SCREENING: MRSA by PCR: NEGATIVE

## 2016-03-18 LAB — RPR: RPR Ser Ql: NONREACTIVE

## 2016-03-18 LAB — TROPONIN I
TROPONIN I: 0.16 ng/mL — AB (ref <0.03)
Troponin I: 0.18 ng/mL (ref <0.03)

## 2016-03-18 MED ORDER — GI COCKTAIL ~~LOC~~
30.0000 mL | Freq: Once | ORAL | Status: AC
Start: 2016-03-18 — End: 2016-03-18
  Administered 2016-03-18: 30 mL via ORAL
  Filled 2016-03-18: qty 30

## 2016-03-18 MED ORDER — MEROPENEM 1 G IV SOLR
1.0000 g | Freq: Two times a day (BID) | INTRAVENOUS | Status: DC
  Administered 2016-03-18 – 2016-03-19 (×2): 1 g via INTRAVENOUS
  Filled 2016-03-18 (×3): qty 1

## 2016-03-18 NOTE — Progress Notes (Signed)
Reviewed EKG. T-wave inversion in inferior and anterolateral leads. When compared to previous EKG in 2016, appears unchanged. Normal rate. Atrial fibrillation. Does not appear consistent with ACS. Troponin trending.  Cordelia Poche, MD [facility] 03/18/2016, 5:41 PM Pager: [phone]

## 2016-03-18 NOTE — Progress Notes (Signed)
PROGRESS NOTE    JOELIE IHRIG  [id number] DOB: 10/19/1931 DOA: 03/17/2016 PCP: Vena Austria, MD   Brief Narrative: APURVA GAROFALO is a 82 y.o. female with a history of dementia, atrial fibrillation, morbid obesity, psychosis, history of PE, hypertension. She presents from [facility]. She was found down with a laceration, which was stapled in the ED and found to have a UTI.    Assessment & Plan:   Principal Problem:   UTI (urinary tract infection) Active Problems:   Morbid obesity ([place])   Obstructive sleep apnea   Atrial fibrillation (HCC)   Altered mental state   Psychosis   Dementia   Laceration of head   Chronic venous stasis dermatitis  Urinary tract infection Patient has a history of ESBL.  -discontinue ciprofloxacin -start meropenem per pharmacy  Left chest pain Probably musculoskeletal vs GI. Patient with a history of hiatal hernia. This is chronic per daughter -GI cocktail -protonix  Head laceration Staple in place. Asymptomatic  Chronic atrial fibrillation Rate controlled -continue Xarelto -continue diltiazem  Altered mental status Dementia Psychosis Appears to be at baseline per daughter. TSH, B12 within normal limits. RPR non-reactive -continue risperdal -continue cogentin -continue zoloft  OSA Morbid obesity   DVT prophylaxis: Xarelto Code Status: Full code Family Communication: Daughter at bedside Disposition Plan: Likely discharge to ALF vs SNF in 1-2 days   Consultants:   None  Procedures:  None  Antimicrobials:  Ciprofloxacin (10/7>>10/8)  Meropenem (10/8>>    Subjective: Patient reports some left sided chest pain. Otherwise no concerns.  Objective: Vitals:   03/17/16 2114 03/18/16 0654 03/18/16 0702 03/18/16 1200  BP: 123/66 104/71 116/75   Pulse: 80 74 73   Resp: 17 19 16    Temp: 98.2 F (36.8 C)  97.6 F (36.4 C)   TempSrc: Oral  Oral   SpO2: 98% 92% 97%   Weight:    94.3 kg (207 lb  14.3 oz)  Height:    5' 1.81" (1.57 m)    Intake/Output Summary (Last 24 hours) at 03/18/16 1337 Last data filed at 03/18/16 0900  Gross per 24 hour  Intake              760 ml  Output              150 ml  Net              610 ml   Filed Weights   03/18/16 1200  Weight: 94.3 kg (207 lb 14.3 oz)    Examination:  General exam: Appears calm and comfortable Respiratory system: Clear to auscultation. Respiratory effort normal. Cardiovascular system: S1 & S2 heard, RRR. No murmurs, rubs, gallops or clicks. Gastrointestinal system: Abdomen is nondistended, soft and nontender. No organomegaly or masses felt. Normal bowel sounds heard. Central nervous system: Alert and oriented to person only. No focal neurological deficits. Musculoskeletal: Some reproduced pain. No edema. No calf tenderness Skin: No cyanosis. No rashes Psychiatry: Judgement and insight appear impaired. Mood & affect appropriate.     Data Reviewed: I have personally reviewed following labs and imaging studies  CBC:  Recent Labs Lab 03/17/16 0815 03/18/16 0521  WBC 7.7 7.5  NEUTROABS 5.4  --   HGB 13.6 14.4  HCT 42.7 45.7  MCV 99.3 99.3  PLT 174 [id number]   Basic Metabolic Panel:  Recent Labs Lab 03/17/16 0815 03/18/16 0521  NA 140 139  K 4.1 3.5  CL 109 104  CO2 25 28  GLUCOSE 110* 101*  BUN 19 16  CREATININE 0.95 0.95  CALCIUM 9.5 9.2   GFR: Estimated Creatinine Clearance: 48.7 mL/min (by C-G formula based on SCr of 0.95 mg/dL). Liver Function Tests:  Recent Labs Lab 03/17/16 0815  AST 16  ALT 12*  ALKPHOS 69  BILITOT 0.6  PROT 5.9*  ALBUMIN 3.4*   No results for input(s): LIPASE, AMYLASE in the last 168 hours. No results for input(s): AMMONIA in the last 168 hours. Coagulation Profile:  Recent Labs Lab 03/17/16 0813  INR 1.55   Cardiac Enzymes: No results for input(s): CKTOTAL, CKMB, CKMBINDEX, TROPONINI in the last 168 hours. BNP (last 3 results) No results for input(s): PROBNP  in the last 8760 hours. HbA1C: No results for input(s): HGBA1C in the last 72 hours. CBG: No results for input(s): GLUCAP in the last 168 hours. Lipid Profile: No results for input(s): CHOL, HDL, LDLCALC, TRIG, CHOLHDL, LDLDIRECT in the last 72 hours. Thyroid Function Tests:  Recent Labs  03/17/16 1542  TSH 0.798   Anemia Panel:  Recent Labs  03/17/16 1542  VITAMINB12 409   Sepsis Labs:  Recent Labs Lab 03/17/16 0847  LATICACIDVEN 1.58    Recent Results (from the past 240 hour(s))  Urine culture     Status: Abnormal (Preliminary result)   Collection Time: 03/17/16 12:40 PM  Result Value Ref Range Status   Specimen Description URINE, CATHETERIZED  Final   Special Requests NONE  Final   Culture >=100,000 COLONIES/mL GRAM NEGATIVE RODS (A)  Final   Report Status PENDING  Incomplete         Radiology Studies: Dg Shoulder Right  Result Date: 03/17/2016 CLINICAL DATA:  Pain following recent fall EXAM: RIGHT SHOULDER - 2+ VIEW COMPARISON:  None. FINDINGS: Frontal and Y scapular images were obtained. There is no demonstrable fracture or dislocation. There is moderate generalized osteoarthritic change. No erosive change. Visualized right lung is clear. IMPRESSION: Moderate generalized osteoarthritic change. No fracture or dislocation. Electronically Signed   By: Lowella Grip III M.D.   On: 03/17/2016 10:01   Ct Head Wo Contrast  Result Date: 03/17/2016 CLINICAL DATA:  Fall with posterior scalp laceration. History of dementia. Initial encounter. EXAM: CT HEAD WITHOUT CONTRAST CT CERVICAL SPINE WITHOUT CONTRAST TECHNIQUE: Multidetector CT imaging of the head and cervical spine was performed following the standard protocol without intravenous contrast. Multiplanar CT image reconstructions of the cervical spine were also generated. COMPARISON:  CT of the head on 01/05/2015 FINDINGS: CT HEAD FINDINGS Brain: No evidence of acute infarction, hemorrhage, hydrocephalus,  extra-axial collection or mass lesion/mass effect. Vascular: No hyperdense vessel or unexpected calcification. Skull: Normal. Negative for fracture or focal lesion. Sinuses/Orbits: No acute finding. Other: Scalp soft tissue swelling over the right lateral vertex. CT CERVICAL SPINE FINDINGS Alignment: Normal. Skull base and vertebrae: No acute fracture. No primary bone lesion or focal pathologic process. Soft tissues and spinal canal: No prevertebral fluid or swelling. No visible canal hematoma. Visualized airway is normally patent. No incidental masses identified. Disc levels: Moderate disc space narrowing and proliferative disease at C5-6. Milder spondylosis at C6-7 with suggestion of minimal anterolisthesis of C6 on C7. Upper chest: Negative. IMPRESSION: 1. Right lateral vertex scalp soft tissue swelling without evidence of acute brain injury or skull fracture. 2. No evidence of acute cervical injury. Moderate cervical spondylosis at C5-6 and milder spondylosis at C6-7. Electronically Signed   By: Aletta Edouard M.D.   On: 03/17/2016 10:13   Ct Cervical Spine Wo  Contrast  Result Date: 03/17/2016 CLINICAL DATA:  Fall with posterior scalp laceration. History of dementia. Initial encounter. EXAM: CT HEAD WITHOUT CONTRAST CT CERVICAL SPINE WITHOUT CONTRAST TECHNIQUE: Multidetector CT imaging of the head and cervical spine was performed following the standard protocol without intravenous contrast. Multiplanar CT image reconstructions of the cervical spine were also generated. COMPARISON:  CT of the head on 01/05/2015 FINDINGS: CT HEAD FINDINGS Brain: No evidence of acute infarction, hemorrhage, hydrocephalus, extra-axial collection or mass lesion/mass effect. Vascular: No hyperdense vessel or unexpected calcification. Skull: Normal. Negative for fracture or focal lesion. Sinuses/Orbits: No acute finding. Other: Scalp soft tissue swelling over the right lateral vertex. CT CERVICAL SPINE FINDINGS Alignment: Normal.  Skull base and vertebrae: No acute fracture. No primary bone lesion or focal pathologic process. Soft tissues and spinal canal: No prevertebral fluid or swelling. No visible canal hematoma. Visualized airway is normally patent. No incidental masses identified. Disc levels: Moderate disc space narrowing and proliferative disease at C5-6. Milder spondylosis at C6-7 with suggestion of minimal anterolisthesis of C6 on C7. Upper chest: Negative. IMPRESSION: 1. Right lateral vertex scalp soft tissue swelling without evidence of acute brain injury or skull fracture. 2. No evidence of acute cervical injury. Moderate cervical spondylosis at C5-6 and milder spondylosis at C6-7. Electronically Signed   By: Aletta Edouard M.D.   On: 03/17/2016 10:13   Dg Knee Complete 4 Views Left  Result Date: 03/17/2016 CLINICAL DATA:  Pain following fall EXAM: LEFT KNEE - COMPLETE 4+ VIEW COMPARISON:  None. FINDINGS: Frontal, lateral, and bilateral oblique views were obtained. Patient is status post total knee arthroplasty with femoral and tibial prosthetic components appearing well-seated. No acute fracture or dislocation. No knee joint effusion. No erosive change. IMPRESSION: Prosthetic components appear well seated. No acute fracture or dislocation. No erosive change or joint effusion. Electronically Signed   By: Lowella Grip III M.D.   On: 03/17/2016 10:02   Dg Hips Bilat W Or Wo Pelvis 3-4 Views  Result Date: 03/17/2016 CLINICAL DATA:  Pain following fall EXAM: DG HIP (WITH OR WITHOUT PELVIS) 3-4V BILAT COMPARISON:  None. FINDINGS: Frontal pelvis as well as frontal and lateral hip views bilaterally -total five views -obtained. No evident fracture or dislocation. There is mild symmetric narrowing of both hip joints. No erosive change. There is mild osteoarthritic change in each sacroiliac joint. Bones appear somewhat osteoporotic. IMPRESSION: Mild narrowing of both hip joints. Mild osteoarthritic change in each sacroiliac  joint. No fracture dislocation. Bones appear somewhat osteoporotic. Electronically Signed   By: Lowella Grip III M.D.   On: 03/17/2016 10:03        Scheduled Meds: . benztropine  0.5 mg Oral BID  . cholecalciferol  2,000 Units Oral Daily  . diltiazem  180 mg Oral Daily  . famotidine  20 mg Oral BID  . furosemide  40 mg Oral Daily  . meropenem (MERREM) IV  1 g Intravenous Q12H  . multivitamin   Oral Daily  . polyethylene glycol  17 g Oral Daily  . risperiDONE  2 mg Oral QHS  . risperiDONE  0.5 mg Oral BH-q7a  . rivaroxaban  20 mg Oral Q supper  . sertraline  25 mg Oral Daily  . sodium chloride flush  3 mL Intravenous Q12H   Continuous Infusions:    LOS: 1 day     Cordelia Poche [facility] 03/18/2016, 1:37 PM Pager: [phone]  If 7PM-7AM, please contact night-coverage [email or web address] Password [email or web address] 03/18/2016, 1:37 PM

## 2016-03-18 NOTE — Progress Notes (Signed)
Pharmacy Antibiotic Note  Monica Strong is a 82 y.o. female admitted on 03/17/2016 with a UTI.  Pharmacy has been consulted for meropenem dosing.  Pt admitted s/p fall with laceration to head.  Has h/o ESBL UTI. Initially received 2 doses of cipro and consulted to dose Zosyn, but recommended to change to meropenem with history.   UA: cloudy, +nitrite, mod leuk, pyuria, many bacteria  Plan: - Meropenem 1 gm IV q12h - F/u UCx and susceptibilities - Monitor renal fx and clinical course  Height: 5' 1.81" (157 cm) Weight: 207 lb 14.3 oz (94.3 kg) IBW/kg (Calculated) : 49.67  Temp (24hrs), Avg:97.8 F (36.6 C), Min:97.6 F (36.4 C), Max:98.2 F (36.8 C)   Recent Labs Lab 03/17/16 0815 03/17/16 0847 03/18/16 0521  WBC 7.7  --  7.5  CREATININE 0.95  --  0.95  LATICACIDVEN  --  1.58  --     Estimated Creatinine Clearance: 48.7 mL/min (by C-G formula based on SCr of 0.95 mg/dL).    Allergies  Allergen Reactions  . Diphenhydramine Nausea Only    Sick to stomach  . Flagyl [Metronidazole] Other (See Comments)    HALLUCINATIONS  . Hydrocodone Other (See Comments)    HALLUCINATIONS  . Meperidine Hcl Nausea And Vomiting  . Penicillins Hives  . Sulfonamide Derivatives Hives    Antimicrobials this admission: 10/7 Cipro x 2 doses Meropenem 10/8 >>  Dose adjustments this admission: None  Microbiology results: 10/7 UCx: GNR  10/7 MRSA PCR: sent  Thank you for allowing pharmacy to be a part of this patient's care.  Keiland Pickering L. Doug Bucklin, PharmD Infectious Diseases Clinical Pharmacist Pager: [phone] 03/18/2016 12:48 PM

## 2016-03-18 NOTE — Progress Notes (Signed)
CRITICAL VALUE ALERT  Critical value received:  Troponin 0.18  Date of notification:  03/18/16  Time of notification:  [id number]  Critical value read back:Yes.    Nurse who received alert:  Danae Chen   MD notified (1st page):  Netty  Time of first page:  [id number]  MD notified (2nd page):  Time of second page:  Responding MD:  Teryl Lucy  Time MD responded:  [id number]  New orders for troponin levels x3 and EKG.

## 2016-03-18 NOTE — Progress Notes (Signed)
Patient confuse, tried to pull IV, get out in the bed, and yelling, RN called daughter to calm her down but patient still agitated, M.D. Informed and he ordered Ativan .5mg   and Haldol 4mg  PRN, haldol was given, patient calm down afterwards, will continue to monitor

## 2016-03-18 NOTE — Evaluation (Signed)
Physical Therapy Evaluation Patient Details Name: Monica Strong MRN: [id number] DOB: 12/19/1931 Today's Date: 03/18/2016   History of Present Illness  Monica Strong is a 82 y.o. female with a history of dementia, atrial fibrillation, morbid obesity, psychosis, history of PE, hypertension. She presents from [facility]. She was found down with a laceration, which was stapled in the ED and found to have a UTI.   Clinical Impression  Pt admitted with above diagnosis. Pt currently with functional limitations due to the deficits listed below (see PT Problem List). My hope would be that as pt's medical status improves with treatment, her functional status will improve and she can dc back to her ALF with her familiar caregivers, environment, and routine; If she is slow to progress functionally, must consider SNF; Pt will benefit from skilled PT to increase their independence and safety with mobility to allow discharge to the venue listed below.       Follow Up Recommendations Home health PT;Other (comment) (at ALF; if slow progress, must consider SNF)    Equipment Recommendations  3in1 (PT);Other (comment) (she is likely well-equipped at ALF -- will need to verify this)    Recommendations for Other Services OT consult     Precautions / Restrictions Precautions Precautions: Fall      Mobility  Bed Mobility Overal bed mobility: Needs Assistance Bed Mobility: Rolling;Sidelying to Sit Rolling: Min assist Sidelying to sit: Mod assist       General bed mobility comments: Verbal and tactile cues to initiate movement; mod assist to elevate trunk to sit  Transfers Overall transfer level: Needs assistance Equipment used: Rolling walker (2 wheeled) Transfers: Sit to/from Stand Sit to Stand: Mod assist         General transfer comment: Cues to initiate, for safety, and for hand placement; light mod assist to power up  Ambulation/Gait Ambulation/Gait assistance: Min assist;+2  safety/equipment Ambulation Distance (Feet): 5 Feet Assistive device: Rolling walker (2 wheeled) Gait Pattern/deviations: Step-to pattern;Decreased step length - right;Decreased step length - left;Trunk flexed     General Gait Details: Extremely short steps; very slow moving  Stairs            Wheelchair Mobility    Modified Rankin (Stroke Patients Only)       Balance Overall balance assessment: Needs assistance   Sitting balance-Leahy Scale: Fair       Standing balance-Leahy Scale: Poor                               Pertinent Vitals/Pain Pain Assessment: No/denies pain    Home Living Family/patient expects to be discharged to:: Assisted living [facility])               Home Equipment: Gilford Rile - 2 wheels      Prior Function Level of Independence: Needs assistance   Gait / Transfers Assistance Needed: walks to dining room with RW  ADL's / Homemaking Assistance Needed: assist from staff for bathing and dressing        Hand Dominance   Dominant Hand: Right    Extremity/Trunk Assessment   Upper Extremity Assessment: Generalized weakness (almost bradykinetic)           Lower Extremity Assessment: Generalized weakness         Communication   Communication: No difficulties;Other (comment) (though slow to answer questions)  Cognition Arousal/Alertness: Awake/alert Behavior During Therapy: WFL for tasks assessed/performed Overall Cognitive  Status: Impaired/Different from baseline Area of Impairment: Following commands     Memory:  (Dementia) Following Commands: Follows one step commands with increased time       General Comments: Needing verbal and tactile cueing to initiate tasks    General Comments General comments (skin integrity, edema, etc.): was incontinent of urine in the bed upon our arrival    Exercises     Assessment/Plan    PT Assessment Patient needs continued PT services  PT Problem List Decreased  strength;Decreased activity tolerance;Decreased balance;Decreased mobility;Decreased coordination;Decreased cognition;Decreased knowledge of use of DME;Decreased safety awareness;Decreased knowledge of precautions          PT Treatment Interventions DME instruction;Gait training;Functional mobility training;Therapeutic activities;Therapeutic exercise;Balance training;Neuromuscular re-education;Cognitive remediation;Patient/family education    PT Goals (Current goals can be found in the Care Plan section)  Acute Rehab PT Goals Patient Stated Goal: did not state PT Goal Formulation: With patient/family Time For Goal Achievement: 04/01/16 Potential to Achieve Goals: Good    Frequency Min 3X/week   Barriers to discharge        Co-evaluation               End of Session Equipment Utilized During Treatment: Gait belt Activity Tolerance: Patient tolerated treatment well Patient left: in chair;with call bell/phone within reach;with chair alarm set;with family/visitor present Nurse Communication: Mobility status         Time: [id number] PT Time Calculation (min) (ACUTE ONLY): 21 min   Charges:   PT Evaluation $PT Eval Moderate Complexity: 1 Procedure     PT G CodesQuin Hoop 03/18/2016, 4:12 PM  Roney Marion, [place] Pager [phone] Office [phone]

## 2016-03-19 ENCOUNTER — Inpatient Hospital Stay (HOSPITAL_COMMUNITY): Payer: Medicare Other

## 2016-03-19 DIAGNOSIS — I482 Chronic atrial fibrillation: Secondary | ICD-10-CM

## 2016-03-19 DIAGNOSIS — R4182 Altered mental status, unspecified: Secondary | ICD-10-CM

## 2016-03-19 DIAGNOSIS — R0789 Other chest pain: Secondary | ICD-10-CM

## 2016-03-19 DIAGNOSIS — F0391 Unspecified dementia with behavioral disturbance: Secondary | ICD-10-CM

## 2016-03-19 DIAGNOSIS — N3000 Acute cystitis without hematuria: Secondary | ICD-10-CM

## 2016-03-19 DIAGNOSIS — R079 Chest pain, unspecified: Secondary | ICD-10-CM

## 2016-03-19 LAB — FOLATE RBC
Folate, Hemolysate: >620 ng/mL
Folate, RBC: >1509 ng/mL (ref >498)
HEMATOCRIT: 41.1 % (ref 34.0–46.6)

## 2016-03-19 LAB — TROPONIN I
TROPONIN I: 0.19 ng/mL — AB (ref <0.03)
Troponin I: 0.14 ng/mL (ref <0.03)

## 2016-03-19 LAB — URINE CULTURE

## 2016-03-19 MED ORDER — HYDROCODONE-ACETAMINOPHEN 5-325 MG PO TABS: 1.0000 | ORAL_TABLET | Freq: Four times a day (QID) | ORAL | Status: DC | PRN

## 2016-03-19 MED ORDER — CIPROFLOXACIN HCL 500 MG PO TABS
250.0000 mg | ORAL_TABLET | Freq: Two times a day (BID) | ORAL | Status: DC
  Administered 2016-03-19 – 2016-03-20 (×3): 250 mg via ORAL
  Filled 2016-03-19 (×3): qty 1

## 2016-03-19 MED ORDER — DILTIAZEM HCL 60 MG PO TABS
60.0000 mg | ORAL_TABLET | Freq: Three times a day (TID) | ORAL | Status: DC
  Administered 2016-03-19 – 2016-03-20 (×4): 60 mg via ORAL
  Filled 2016-03-19 (×4): qty 1

## 2016-03-19 MED ORDER — ADULT MULTIVITAMIN LIQUID CH
15.0000 mL | ORAL | Status: DC
  Administered 2016-03-19 – 2016-03-20 (×2): 15 mL via ORAL
  Filled 2016-03-19 (×2): qty 15

## 2016-03-19 NOTE — Clinical Social Work Note (Signed)
Clinical Social Work Assessment  Patient Details  Name: Monica Strong MRN: [id number] Date of Birth: 03-04-1932  Date of referral:  03/19/16               Reason for consult:  Facility Placement                Permission sought to share information with:  Facility Sport and exercise psychologist, Family Supports Permission granted to share information::  Yes, Verbal Permission Granted  Name::     [name]  Agency::  SNFs  Relationship::  Daughter  Contact Information:  [phone]  Housing/Transportation Living arrangements for the past 2 months:  [place] of Information:  Adult Children Patient Interpreter Needed:  None Criminal Activity/Legal Involvement Pertinent to Current Situation/Hospitalization:  No - Comment as needed Significant Relationships:  Adult Children Lives with:  Facility Resident Do you feel safe going back to the place where you live?  No Need for family participation in patient care:  Yes (Comment)  Care giving concerns:  CSW received consult for possible SNF placement at time of discharge. CSW met with patient and patient's daughter at bedside regarding PT recommendation of SNF placement at time of discharge. Per patient's daughter, patient lives at [facility]. Given patient's current physical needs and fall risk, patient's daughter would like SNF. CSW to continue to follow and assist with discharge planning needs.   Social Worker assessment / plan:  CSW spoke with patient and patient's daughter concerning possibility of rehab at [facility] before returning to ALF.  Employment status:  Retired Nurse, adult PT Recommendations:  [place], Home with [place] / Referral to community resources:  [place]  Patient/Family's Response to care:  Patient and patient's daughter recognize need for rehab before returning home and are agreeable to a SNF in [place]. Patient  reported preference for [facility] since she has been there before. Patient's daughter was tearful and expressed the difficulty in paying for her mother's ALF. CSW provided emotional support and suggested she fill out a Medicaid application for extra financial support.   Patient/Family's Understanding of and Emotional Response to Diagnosis, Current Treatment, and Prognosis:  Patient/family is realistic regarding therapy needs and expressed being hopeful for SNF placement. Patient's daughter expressed understanding of CSW role and discharge process. No questions/concerns about plan or treatment.    Emotional Assessment Appearance:  Appears stated age Attitude/Demeanor/Rapport:  Other (Pleasant but disoriented) Affect (typically observed):  Pleasant, Other (Disoriented) Orientation:  Oriented to Self Alcohol / Substance use:  Not Applicable Psych involvement (Current and /or in the community):  No (Comment)  Discharge Needs  Concerns to be addressed:  Care Coordination Readmission within the last 30 days:  No Current discharge risk:  None Barriers to Discharge:  Continued Medical Work up   [employer], [place] 03/19/2016, 11:46 AM

## 2016-03-19 NOTE — Consult Note (Signed)
Cardiology Consult    Patient ID: AYDAN LABO MRN: [id number], DOB/AGE: September 08, 1931   Admit date: 03/17/2016 Date of Consult: 03/19/2016  Primary Physician: Vena Austria, MD Primary Cardiologist: New Requesting Provider: Dr. Lonny Prude Reason for Consultation: Chest pain  Patient Profile    82 yo female with PMH of chronic atrial fib, HTN, OSA, PE and dementia/psychosis who presented to the [facility] ED after having an unwitnessed fall at the nursing home.  Past Medical History   Past Medical History:  Diagnosis Date  . Arthritis    "hands" (01/05/2015)  . Atrial fibrillation ([place])   . Chest pain    a. Normal stress test 08/2008 and CTA neg for PE at that time.  . Dysuria    has urethral bump  . GERD (gastroesophageal reflux disease)   . H/O hiatal hernia   . Heart murmur MILD -- ASYMPTOMATIC  . History of infection due to ESBL Escherichia coli 02/23/2014  . Hypertension   . Inner ear dysfunction    a. Prior h/o vertigo.  Marland Kitchen Neuromuscular disorder (HCC)    rt hand numbness  . Obesity   . OSA (obstructive sleep apnea)    not using cpap (01/05/2015)  . Paranoia ([place])   . Poor short term memory   . Psychotic episode    "was hospitalized at [facility] 07/2014; was having hallucinations; on RX now" (01/05/2015)  . Pulmonary embolism ([place]) ~ 05/2014  . Pulmonary nodules BENIGN--  MONITORED BY PCP DR READE--  ASYMPTOMATIC    LAST CHEST CT 08-17-2011  . Renal stones left  . Sinus drainage   . Ureteral stent retained   . Urge urinary incontinence     Past Surgical History:  Procedure Laterality Date  . APPENDECTOMY     PT STATES PER XRAY SMALL AMOUNT OF APPENDIX LEFT  . BLEPHAROPLASTY Bilateral   . CARDIOVASCULAR STRESS TEST  03-13-2007  dr Tressia Miners turner   NORMAL LV SIZE SYSTOLIC FUCTION/ NO ISCHEMIA/ EF 85%  . CATARACT EXTRACTION W/ INTRAOCULAR LENS  IMPLANT, BILATERAL    . CYSTOSCOPY W/ RETROGRADES  07/11/2012   Procedure: CYSTOSCOPY WITH RETROGRADE PYELOGRAM;   Surgeon: Alexis Frock, MD;  Location[facility];  Service: Urology;  Laterality: Left;  left third stage ureteroscopystone manipulation  . CYSTOSCOPY W/ URETERAL STENT PLACEMENT  06/13/2012   Procedure: CYSTOSCOPY WITH STENT REPLACEMENT;  Surgeon: Alexis Frock, MD;  Location: [facility];  Service: Urology;  Laterality: Left;  . CYSTOSCOPY W/ URETERAL STENT REMOVAL  07/11/2012   Procedure: CYSTOSCOPY WITH STENT REMOVAL;  Surgeon: Alexis Frock, MD;  Location: [facility];  Service: Urology;  Laterality: Left;  . CYSTOSCOPY WITH RETROGRADE PYELOGRAM, URETEROSCOPY AND STENT PLACEMENT  05/14/2012   Procedure: CYSTOSCOPY WITH RETROGRADE PYELOGRAM, URETEROSCOPY AND STENT PLACEMENT;  Surgeon: Alexis Frock, MD;  Location: [facility];  Service: Urology;  Laterality: Left;  1ST STAGE LEFT URETEROSCOPY, LEFT RETROGRADE, DIGITAL URETEROSCOPY,  LEFT STONE REMOVAL WITH ESCAPE BASKET,  STENT PLACEMENt   . CYSTOSCOPY/RETROGRADE/URETEROSCOPY/STONE EXTRACTION WITH BASKET  06/13/2012   Procedure: CYSTOSCOPY/RETROGRADE/URETEROSCOPY/STONE EXTRACTION WITH BASKET;  Surgeon: Alexis Frock, MD;  Location: [facility];  Service: Urology;  Laterality: Left;  . HOLMIUM LASER APPLICATION  [id number]   Procedure: HOLMIUM LASER APPLICATION;  Surgeon: Alexis Frock, MD;  Location: [facility];  Service: Urology;  Laterality: Left;  . HOLMIUM LASER APPLICATION  [id number]   Procedure: HOLMIUM LASER APPLICATION;  Surgeon: Alexis Frock, MD;  Location: [facility] [place]  [place];  Service: Urology;  Laterality: Left;  . JOINT REPLACEMENT    . REMOVAL BREAST CYST, BENIGN  1960's  . TONSILLECTOMY    . TOTAL KNEE ARTHROPLASTY Left 10-17-2009  DR Wynelle Link  . TOTAL KNEE ARTHROPLASTY Right 04-25-2009  . TRANSTHORACIC ECHOCARDIOGRAM  08-18-2008   NORMAL LVSF/ EF 55-60%/ MILD MITRAL REGURG .  Marland Kitchen URETEROSCOPY  07/11/2012   Procedure: URETEROSCOPY;   Surgeon: Alexis Frock, MD;  Location: [facility];  Service: Urology;  Laterality: Left;  Marland Kitchen VAGINAL HYSTERECTOMY  1970     Allergies  Allergies  Allergen Reactions  . Diphenhydramine Nausea Only    Sick to stomach  . Flagyl [Metronidazole] Other (See Comments)    HALLUCINATIONS  . Hydrocodone Other (See Comments)    HALLUCINATIONS  . Meperidine Hcl Nausea And Vomiting  . Penicillins Hives  . Sulfonamide Derivatives Hives    History of Present Illness    Mrs. Blecha is a 82 yo female with a hx of chronic atrial fibrillation which is managed by her PCP in the skilled nursing facility. Also noted hx of HTN, dementia/psychosis, and OSA. Daughter reports she was dx with atrial fib a few years ago ans was placed on Xarelto and diltiazem at that time. Focus was more on rate control, rather than rhythm. The patient is a very poor historian but daughter reports hx of dementia. Appears she was admitted in 2016 for similar symptoms. Had a 2D echo at that time showed normal EF of 50-55% with no WMA and mild MR. Had a normal stress test in 2010.   She had an unwitnessed fall at the [facility] on 10/7, and staff found her on the ground. Noted a laceration to the back of her, with blood. Mrs. Bucknam is unable to recall what she was doing prior to the fall, or what associated symptoms she may have had.   In the ED her laceration was stapled. Labs showed stable electrolytes, along with Hgb of 13.6. TSH was normal. Trop were cycled with a flat non-diagnostic trend. In review of labs, appears trop levels were elevated at same level during previous admission and felt to be related to demand ischemia. CT of the head showed no bleeding. Also noted to have UTI on admission. During this admission she reported episode of chest pain. EKG on admission showed atrial fib with TWI in inferior/lateral leads with ? Minimal ST depression lateral leads, noted on previous tracings.     Inpatient Medications    . benztropine  0.5 mg Oral BID  . cholecalciferol  2,000 Units Oral Daily  . ciprofloxacin  250 mg Oral BID  . diltiazem  60 mg Oral Q8H  . famotidine  20 mg Oral BID  . furosemide  40 mg Oral Daily  . multivitamin  15 mL Oral Q24H  . polyethylene glycol  17 g Oral Daily  . risperiDONE  2 mg Oral QHS  . risperiDONE  0.5 mg Oral BH-q7a  . rivaroxaban  20 mg Oral Q supper  . sertraline  25 mg Oral Daily  . sodium chloride flush  3 mL Intravenous Q12H    Family History    Family History  Problem Relation Age of Onset  . Other      Pt unaware due to psych condition  . Heart attack Father     Social History    Social History   Social History  . Marital status: Divorced    Spouse  name: N/A  . Number of children: N/A  . Years of education: N/A   Occupational History  . Not on file.   Social History Main Topics  . Smoking status: Never Smoker  . Smokeless tobacco: Never Used  . Alcohol use No  . Drug use: No  . Sexual activity: Not on file   Other Topics Concern  . Not on file   Social History Narrative  . No narrative on file     Review of Systems    See HPI All other systems reviewed and are otherwise negative except as noted above.  Physical Exam    Blood pressure 98/70, pulse 69, temperature 98.3 F (36.8 C), temperature source Oral, resp. rate 17, height 5' 1.81" (1.57 m), weight 207 lb 14.3 oz (94.3 kg), SpO2 99 %.  General: Pleasant confused older caucasian female, NAD Psych: Normal affect. Neuro: Alert and oriented to person. Moves all extremities spontaneously. HEENT: Normal  Neck: Supple, no JVD. Lungs:  Resp regular and unlabored, CTA. Heart: Irregularly irregular no s3, s4, or murmurs. Abdomen: Soft, non-tender, non-distended, BS + x 4.  Extremities: No clubbing, cyanosis or edema. DP/PT/Radials 2+ and equal bilaterally.  Labs    Troponin (Point of Care Test) No results for input(s): TROPIPOC in the last 72  hours.  Recent Labs  03/18/16 1506 03/18/16 2037 03/19/16 0231 03/19/16 1128  TROPONINI 0.18* 0.16* 0.19* 0.14*   Lab Results  Component Value Date   WBC 7.5 03/18/2016   HGB 14.4 03/18/2016   HCT 41.1 03/18/2016   MCV 99.3 03/18/2016   PLT 196 03/18/2016    Recent Labs Lab 03/17/16 0815 03/18/16 0521  NA 140 139  K 4.1 3.5  CL 109 104  CO2 25 28  BUN 19 16  CREATININE 0.95 0.95  CALCIUM 9.5 9.2  PROT 5.9*  --   BILITOT 0.6  --   ALKPHOS 69  --   ALT 12*  --   AST 16  --   GLUCOSE 110* 101*   Lab Results  Component Value Date   CHOL  07/16/2008    195        ATP III CLASSIFICATION:  <200     mg/dL   Desirable  200-239  mg/dL   Borderline High  >=240    mg/dL   High          HDL 54 07/16/2008   LDLCALC (H) 07/16/2008    122        Total Cholesterol/HDL:CHD Risk Coronary Heart Disease Risk Table                     Men   Women  1/2 Average Risk   3.4   3.3  Average Risk       5.0   4.4  2 X Average Risk   9.6   7.1  3 X Average Risk  23.4   11.0        Use the calculated Patient Ratio above and the CHD Risk Table to determine the patient's CHD Risk.        ATP III CLASSIFICATION (LDL):  <100     mg/dL   Optimal  100-129  mg/dL   Near or Above                    Optimal  130-159  mg/dL   Borderline  160-189  mg/dL   High  >190     mg/dL  Very High   TRIG 93 07/16/2008   Lab Results  Component Value Date   DDIMER (H) 07/15/2008    0.49        AT THE INHOUSE ESTABLISHED CUTOFF VALUE OF 0.48 ug/mL FEU, THIS ASSAY HAS BEEN DOCUMENTED IN THE LITERATURE TO HAVE A SENSITIVITY AND NEGATIVE PREDICTIVE VALUE OF AT LEAST 98 TO 99%.  THE TEST RESULT SHOULD BE CORRELATED WITH AN ASSESSMENT OF THE CLINICAL PROBABILITY OF DVT / VTE.     Radiology Studies    Dg Shoulder Right  Result Date: 03/17/2016 CLINICAL DATA:  Pain following recent fall EXAM: RIGHT SHOULDER - 2+ VIEW COMPARISON:  None. FINDINGS: Frontal and Y scapular images were obtained.  There is no demonstrable fracture or dislocation. There is moderate generalized osteoarthritic change. No erosive change. Visualized right lung is clear. IMPRESSION: Moderate generalized osteoarthritic change. No fracture or dislocation. Electronically Signed   By: Lowella Grip III M.D.   On: 03/17/2016 10:01   Ct Head Wo Contrast  Result Date: 03/17/2016 CLINICAL DATA:  Fall with posterior scalp laceration. History of dementia. Initial encounter. EXAM: CT HEAD WITHOUT CONTRAST CT CERVICAL SPINE WITHOUT CONTRAST TECHNIQUE: Multidetector CT imaging of the head and cervical spine was performed following the standard protocol without intravenous contrast. Multiplanar CT image reconstructions of the cervical spine were also generated. COMPARISON:  CT of the head on 01/05/2015 FINDINGS: CT HEAD FINDINGS Brain: No evidence of acute infarction, hemorrhage, hydrocephalus, extra-axial collection or mass lesion/mass effect. Vascular: No hyperdense vessel or unexpected calcification. Skull: Normal. Negative for fracture or focal lesion. Sinuses/Orbits: No acute finding. Other: Scalp soft tissue swelling over the right lateral vertex. CT CERVICAL SPINE FINDINGS Alignment: Normal. Skull base and vertebrae: No acute fracture. No primary bone lesion or focal pathologic process. Soft tissues and spinal canal: No prevertebral fluid or swelling. No visible canal hematoma. Visualized airway is normally patent. No incidental masses identified. Disc levels: Moderate disc space narrowing and proliferative disease at C5-6. Milder spondylosis at C6-7 with suggestion of minimal anterolisthesis of C6 on C7. Upper chest: Negative. IMPRESSION: 1. Right lateral vertex scalp soft tissue swelling without evidence of acute brain injury or skull fracture. 2. No evidence of acute cervical injury. Moderate cervical spondylosis at C5-6 and milder spondylosis at C6-7. Electronically Signed   By: Aletta Edouard M.D.   On: 03/17/2016 10:13    Ct Cervical Spine Wo Contrast  Result Date: 03/17/2016 CLINICAL DATA:  Fall with posterior scalp laceration. History of dementia. Initial encounter. EXAM: CT HEAD WITHOUT CONTRAST CT CERVICAL SPINE WITHOUT CONTRAST TECHNIQUE: Multidetector CT imaging of the head and cervical spine was performed following the standard protocol without intravenous contrast. Multiplanar CT image reconstructions of the cervical spine were also generated. COMPARISON:  CT of the head on 01/05/2015 FINDINGS: CT HEAD FINDINGS Brain: No evidence of acute infarction, hemorrhage, hydrocephalus, extra-axial collection or mass lesion/mass effect. Vascular: No hyperdense vessel or unexpected calcification. Skull: Normal. Negative for fracture or focal lesion. Sinuses/Orbits: No acute finding. Other: Scalp soft tissue swelling over the right lateral vertex. CT CERVICAL SPINE FINDINGS Alignment: Normal. Skull base and vertebrae: No acute fracture. No primary bone lesion or focal pathologic process. Soft tissues and spinal canal: No prevertebral fluid or swelling. No visible canal hematoma. Visualized airway is normally patent. No incidental masses identified. Disc levels: Moderate disc space narrowing and proliferative disease at C5-6. Milder spondylosis at C6-7 with suggestion of minimal anterolisthesis of C6 on C7. Upper chest: Negative. IMPRESSION: 1. Right lateral  vertex scalp soft tissue swelling without evidence of acute brain injury or skull fracture. 2. No evidence of acute cervical injury. Moderate cervical spondylosis at C5-6 and milder spondylosis at C6-7. Electronically Signed   By: Aletta Edouard M.D.   On: 03/17/2016 10:13   Dg Knee Complete 4 Views Left  Result Date: 03/17/2016 CLINICAL DATA:  Pain following fall EXAM: LEFT KNEE - COMPLETE 4+ VIEW COMPARISON:  None. FINDINGS: Frontal, lateral, and bilateral oblique views were obtained. Patient is status post total knee arthroplasty with femoral and tibial prosthetic  components appearing well-seated. No acute fracture or dislocation. No knee joint effusion. No erosive change. IMPRESSION: Prosthetic components appear well seated. No acute fracture or dislocation. No erosive change or joint effusion. Electronically Signed   By: Lowella Grip III M.D.   On: 03/17/2016 10:02   Dg Hips Bilat W Or Wo Pelvis 3-4 Views  Result Date: 03/17/2016 CLINICAL DATA:  Pain following fall EXAM: DG HIP (WITH OR WITHOUT PELVIS) 3-4V BILAT COMPARISON:  None. FINDINGS: Frontal pelvis as well as frontal and lateral hip views bilaterally -total five views -obtained. No evident fracture or dislocation. There is mild symmetric narrowing of both hip joints. No erosive change. There is mild osteoarthritic change in each sacroiliac joint. Bones appear somewhat osteoporotic. IMPRESSION: Mild narrowing of both hip joints. Mild osteoarthritic change in each sacroiliac joint. No fracture dislocation. Bones appear somewhat osteoporotic. Electronically Signed   By: Lowella Grip III M.D.   On: 03/17/2016 10:03    ECG & Cardiac Imaging    EKG: Atrial Fib with TWI in inferior/lateral leads noted on previous tracings.   Echo: 01/07/15  Study Conclusions  - Left ventricle: The cavity size was normal. Wall thickness was   increased in a pattern of severe LVH. Systolic function was   normal. The estimated ejection fraction was in the range of 50%   to 55%. Wall motion was normal; there were no regional wall   motion abnormalities. - Mitral valve: Calcified annulus. There was mild regurgitation. - Left atrium: The atrium was moderately dilated.  Assessment & Plan    82 yo female with PMH of chronic atrial fib, HTN, OSA, PE and dementia/psychosis who presented to the [facility] ED after having an unwitnessed fall at the nursing home.  1. Atypical chest pain: Patient is a very difficult historian and unable to give history regarding events leading to presentation to the ED. There was  concern regarding reports of chest pain, but patient points to the LUQ when asked where she had experienced pain. No reported associated symptoms of dyspnea, n/v or diaphoresis. Trop with flat non-diagnostic trend, but also were elevated during previous admission in 2016. EKG with no acute changes noted. Did have unwitnessed fall, nonacute CT head. Chest is non-tender to palpation but did not received CXR on admission.  -- Check chest x-ray for acute process. Would start with repeat echo to assess for wall motion abnormality and value anatomy given unwitnessed fall. Daughter reports hx of diverticulitis with similar area of pain in the LUQ in the past. Question GI component? Does not seem likely that pain is cardiac in nature at this time.   2. Chronic Atrial Fib: Currently followed by PCP on diltiazem and Xarelto. Blood pressures have been soft this admission. May need to reduce home dose? Has been placed on IR this admission.  -- This patients CHA2DS2-VASc Score and unadjusted Ischemic Stroke Rate (% per year) is equal to 4.8 % stroke rate/year  from a score of 4 Above score calculated as 1 point each if present [CHF, HTN, DM, Vascular=MI/PAD/Aortic Plaque, Age if 65-74, or Female] Above score calculated as 2 points each if present [Age > 75, or Stroke/TIA/TE]  3. Hx of PE: On Xarelto  4. HTN: Soft this admission. May need to reduce home diltiazem dose   Signed, Reino Bellis, NP-C Pager [phone] 03/19/2016, 3:14 PM

## 2016-03-19 NOTE — Progress Notes (Signed)
PROGRESS NOTE    Monica Strong  [id number] DOB: 1931/10/19 DOA: 03/17/2016 PCP: Vena Austria, MD   Brief Narrative: Monica Strong is a 82 y.o. female with a history of dementia, atrial fibrillation, morbid obesity, psychosis, history of PE, hypertension. She presents from [facility]. She was found down with a laceration, which was stapled in the ED and found to have a UTI.    Assessment & Plan:   Principal Problem:   UTI (urinary tract infection) Active Problems:   Morbid obesity ([place])   Obstructive sleep apnea   Atrial fibrillation (HCC)   Altered mental state   Psychosis   Dementia   Laceration of head   Chronic venous stasis dermatitis  Urinary tract infection Patient has a history of ESBL.  -discontinue ciprofloxacin -start meropenem per pharmacy  Left chest pain Probably musculoskeletal vs GI. Patient with a history of hiatal hernia. This is chronic per daughter -GI cocktail -protonix  Head laceration Staple in place. Asymptomatic  Chest pain This appears likely musculoskeletal or GI related. Patient has had previously elevated troponin in the past. Troponin is about stable this admission. She does have some mild dyspnea associated. BP too low to give nitro at this time. EKG from yesterday appears has T wave abnormalities that are unchanged from previous. GI cocktail did not help. Patient does not have a cardiologist. -consult cardiology -echocardiogram to assess wall motion -repeat EKG -chest x-ray  Chronic atrial fibrillation Rate controlled -continue Xarelto -continue diltiazem (will switch to immediate release secondary to soft blood pressures)  Altered mental status Dementia Psychosis Appears to be at baseline per daughter. TSH, B12 within normal limits. RPR non-reactive -continue risperdal -continue cogentin -continue zoloft  OSA Morbid obesity   DVT prophylaxis: Xarelto Code Status: Full code Family Communication:  Daughter at bedside Disposition Plan: Likely discharge to ALF vs SNF in 1-2 days   Consultants:   None  Procedures:  None  Antimicrobials:  Ciprofloxacin (10/7>>10/8)  Meropenem (10/8>>10/9)  Ciprofloxacin (10/9>>   Subjective: Patient reports some left sided chest pain. GI cocktail did not help. Mild dyspnea.  Objective: Vitals:   03/18/16 1529 03/18/16 2128 03/19/16 0614 03/19/16 0945  BP: 102/64 (!) 106/52 97/66 (!) 98/57  Pulse:  82 88 69  Resp:  18 17   Temp:  97.5 F (36.4 C) 97.6 F (36.4 C)   TempSrc:  Oral Oral   SpO2:  98% 99%   Weight:      Height:        Intake/Output Summary (Last 24 hours) at 03/19/16 1124 Last data filed at 03/18/16 1700  Gross per 24 hour  Intake              340 ml  Output                0 ml  Net              340 ml   Filed Weights   03/18/16 1200  Weight: 94.3 kg (207 lb 14.3 oz)    Examination:  General exam: Appears calm and comfortable Respiratory system: Clear to auscultation. Respiratory effort normal. Cardiovascular system: S1 & S2 heard, RRR. No murmurs, rubs, gallops or clicks. Gastrointestinal system: Abdomen is nondistended, soft and nontender. No organomegaly or masses felt. Normal bowel sounds heard. Central nervous system: Alert and oriented to person only. No focal neurological deficits. Musculoskeletal: Reproduced pain. No edema. No calf tenderness Skin: No cyanosis. No rashes. Single staple on right  posterior skull Psychiatry: Judgement and insight appear impaired. Mood & affect appropriate.     Data Reviewed: I have personally reviewed following labs and imaging studies  CBC:  Recent Labs Lab 03/17/16 0815 03/18/16 0521  WBC 7.7 7.5  NEUTROABS 5.4  --   HGB 13.6 14.4  HCT 42.7 45.7  MCV 99.3 99.3  PLT 174 [id number]   Basic Metabolic Panel:  Recent Labs Lab 03/17/16 0815 03/18/16 0521  NA 140 139  K 4.1 3.5  CL 109 104  CO2 25 28  GLUCOSE 110* 101*  BUN 19 16  CREATININE 0.95 0.95    CALCIUM 9.5 9.2   GFR: Estimated Creatinine Clearance: 48.7 mL/min (by C-G formula based on SCr of 0.95 mg/dL). Liver Function Tests:  Recent Labs Lab 03/17/16 0815  AST 16  ALT 12*  ALKPHOS 69  BILITOT 0.6  PROT 5.9*  ALBUMIN 3.4*   No results for input(s): LIPASE, AMYLASE in the last 168 hours. No results for input(s): AMMONIA in the last 168 hours. Coagulation Profile:  Recent Labs Lab 03/17/16 0813  INR 1.55   Cardiac Enzymes:  Recent Labs Lab 03/18/16 1506 03/18/16 2037 03/19/16 0231  TROPONINI 0.18* 0.16* 0.19*   BNP (last 3 results) No results for input(s): PROBNP in the last 8760 hours. HbA1C: No results for input(s): HGBA1C in the last 72 hours. CBG: No results for input(s): GLUCAP in the last 168 hours. Lipid Profile: No results for input(s): CHOL, HDL, LDLCALC, TRIG, CHOLHDL, LDLDIRECT in the last 72 hours. Thyroid Function Tests:  Recent Labs  03/17/16 1542  TSH 0.798   Anemia Panel:  Recent Labs  03/17/16 1542  VITAMINB12 409   Sepsis Labs:  Recent Labs Lab 03/17/16 0847  LATICACIDVEN 1.58    Recent Results (from the past 240 hour(s))  Urine culture     Status: Abnormal   Collection Time: 03/17/16 12:40 PM  Result Value Ref Range Status   Specimen Description URINE, CATHETERIZED  Final   Special Requests NONE  Final   Culture >=100,000 COLONIES/mL ESCHERICHIA COLI (A)  Final   Report Status 03/19/2016 FINAL  Final   Organism ID, Bacteria ESCHERICHIA COLI (A)  Final      Susceptibility   Escherichia coli - MIC*    AMPICILLIN <=2 SENSITIVE Sensitive     CEFAZOLIN <=4 SENSITIVE Sensitive     CEFTRIAXONE <=1 SENSITIVE Sensitive     CIPROFLOXACIN 1 SENSITIVE Sensitive     GENTAMICIN <=1 SENSITIVE Sensitive     IMIPENEM <=0.25 SENSITIVE Sensitive     NITROFURANTOIN <=16 SENSITIVE Sensitive     TRIMETH/SULFA <=20 SENSITIVE Sensitive     AMPICILLIN/SULBACTAM <=2 SENSITIVE Sensitive     PIP/TAZO <=4 SENSITIVE Sensitive      Extended ESBL NEGATIVE Sensitive     * >=100,000 COLONIES/mL ESCHERICHIA COLI  MRSA PCR Screening     Status: None   Collection Time: 03/18/16  1:02 PM  Result Value Ref Range Status   MRSA by PCR NEGATIVE NEGATIVE Final    Comment:        The GeneXpert MRSA Assay (FDA approved for NASAL specimens only), is one component of a comprehensive MRSA colonization surveillance program. It is not intended to diagnose MRSA infection nor to guide or monitor treatment for MRSA infections.          Radiology Studies: No results found.      Scheduled Meds: . benztropine  0.5 mg Oral BID  . cholecalciferol  2,000 Units Oral Daily  .  ciprofloxacin  250 mg Oral BID  . diltiazem  180 mg Oral Daily  . famotidine  20 mg Oral BID  . furosemide  40 mg Oral Daily  . meropenem (MERREM) IV  1 g Intravenous Q12H  . multivitamin  15 mL Oral Q24H  . polyethylene glycol  17 g Oral Daily  . risperiDONE  2 mg Oral QHS  . risperiDONE  0.5 mg Oral BH-q7a  . rivaroxaban  20 mg Oral Q supper  . sertraline  25 mg Oral Daily  . sodium chloride flush  3 mL Intravenous Q12H   Continuous Infusions:    LOS: 2 days     Cordelia Poche [facility] 03/19/2016, 11:24 AM Pager: [phone]  If 7PM-7AM, please contact night-coverage [email or web address] Password [email or web address] 03/19/2016, 11:24 AM

## 2016-03-19 NOTE — [place] FL2 (Signed)
[place] MEDICAID FL2 LEVEL OF CARE SCREENING TOOL     IDENTIFICATION  Patient Name: Monica Strong Birthdate: 10/07/1931 Sex: female Admission Date (Current Location): 03/17/2016  [facility] and [place] Number:  Herbalist and Address:  [facility], [place] [address] [place], [place] [address]      Provider Number: [id number]  Attending Physician Name and Address:  Mariel Aloe, MD  Relative Name and Phone Number:  Shirlean Mylar, daughter, [phone]    Current Level of Care: Hospital Recommended Level of Care: [place] Prior Approval Number:    Date Approved/Denied:   PASRR Number: [id number]  Discharge Plan: SNF    Current Diagnoses: Patient Active Problem List   Diagnosis Date Noted  . UTI (urinary tract infection) 03/17/2016  . Laceration of head 03/17/2016  . Chronic venous stasis dermatitis 03/17/2016  . Nodule of right lung 01/17/2015  . Elevated troponin 01/05/2015  . Abdominal pain 01/05/2015  . Knee pain, right 01/05/2015  . Dementia 01/05/2015  . Fall 01/05/2015  . Hilar adenopathy 05/15/2014  . Faintness   . PE (pulmonary embolism)   . Thrombocytopenia ([place]) 04/15/2014  . LOC (loss of consciousness) ([place]) 04/15/2014  . Transient loss of consciousness 04/05/2014  . Syncope 04/05/2014  . History of infection due to ESBL Escherichia coli 02/23/2014  . Demand ischemia ([place]) 02/23/2014  . Psychosis 02/22/2014  . Atrial fibrillation ([place]) 02/17/2014  . Altered mental state 02/17/2014  . Paranoia ([place]) 02/17/2014  . Troponin level elevated 02/17/2014  . Unspecified vitamin D deficiency 02/19/2013  . Osteopenia 02/19/2013  . Morbid obesity ([place]) 06/14/2010  . Obstructive sleep apnea 06/14/2010  . GERD 06/14/2010  . DIVERTICULOSIS OF COLON 06/14/2010  . RECTAL BLEEDING 06/14/2010  . COLONIC POLYPS, HX OF 06/14/2010    Orientation RESPIRATION BLADDER Height & Weight     Self  Normal Incontinent Weight:  94.3 kg (207 lb 14.3 oz) Height:  5' 1.81" (157 cm)  BEHAVIORAL SYMPTOMS/MOOD NEUROLOGICAL BOWEL NUTRITION STATUS      Continent Diet (Please see DC Summary)  AMBULATORY STATUS COMMUNICATION OF NEEDS Skin   Extensive Assist Verbally Normal                       Personal Care Assistance Level of Assistance  Bathing, Feeding, Dressing Bathing Assistance: Maximum assistance Feeding assistance: Limited assistance Dressing Assistance: Maximum assistance     Functional Limitations Info             SPECIAL CARE FACTORS FREQUENCY  PT (By licensed PT)     PT Frequency: min 3x/week              Contractures      Additional Factors Info  Code Status, Allergies Code Status Info: Full Allergies Info: Diphenhydramine, Flagyl Metronidazole, Hydrocodone, Meperidine Hcl, Penicillins, Sulfonamide Derivatives           Current Medications (03/19/2016):  This is the current hospital active medication list Current Facility-Administered Medications  Medication Dose Route Frequency Provider Last Rate Last Dose  . 0.9 %  sodium chloride infusion  250 mL Intravenous PRN Maren Reamer, MD      . acetaminophen (TYLENOL) tablet 650 mg  650 mg Oral Q6H PRN Maren Reamer, MD   650 mg at 03/19/16 0717  . benztropine (COGENTIN) tablet 0.5 mg  0.5 mg Oral BID Maren Reamer, MD   0.5 mg at 03/19/16 1046  . cholecalciferol (VITAMIN D)  tablet 2,000 Units  2,000 Units Oral Daily Maren Reamer, MD   2,000 Units at 03/19/16 1047  . ciprofloxacin (CIPRO) tablet 250 mg  250 mg Oral BID Mariel Aloe, MD   250 mg at 03/19/16 1104  . dextromethorphan (DELSYM) 30 MG/5ML liquid 60 mg  60 mg Oral Q12H PRN Maren Reamer, MD      . diltiazem (CARDIZEM CD) 24 hr capsule 180 mg  180 mg Oral Daily Maren Reamer, MD   180 mg at 03/18/16 1105  . famotidine (PEPCID) tablet 20 mg  20 mg Oral BID Maren Reamer, MD   20 mg at 03/19/16 1048  . furosemide (LASIX) tablet 40 mg  40 mg Oral Daily  Maren Reamer, MD   40 mg at 03/19/16 1047  . haloperidol lactate (HALDOL) injection 4 mg  4 mg Intramuscular Q4H PRN Merton Border, MD   4 mg at 03/17/16 2236  . LORazepam (ATIVAN) injection 0.5 mg  0.5 mg Intravenous Q4H PRN Merton Border, MD      . multivitamin liquid 15 mL  15 mL Oral Q24H Mariel Aloe, MD      . polyethylene glycol (MIRALAX / GLYCOLAX) packet 17 g  17 g Oral Daily Maren Reamer, MD   17 g at 03/19/16 1046  . risperiDONE (RISPERDAL M-TABS) disintegrating tablet 2 mg  2 mg Oral QHS Maren Reamer, MD   2 mg at 03/18/16 2123  . risperiDONE (RISPERDAL) tablet 0.5 mg  0.5 mg Oral BH-q7a Maren Reamer, MD   0.5 mg at 03/19/16 0601  . rivaroxaban (XARELTO) tablet 20 mg  20 mg Oral Q supper Maren Reamer, MD   20 mg at 03/18/16 1744  . sertraline (ZOLOFT) tablet 25 mg  25 mg Oral Daily Maren Reamer, MD   25 mg at 03/19/16 1047  . sodium chloride flush (NS) 0.9 % injection 3 mL  3 mL Intravenous Q12H Maren Reamer, MD   3 mL at 03/19/16 1000  . sodium chloride flush (NS) 0.9 % injection 3 mL  3 mL Intravenous PRN Maren Reamer, MD         Discharge Medications: Please see discharge summary for a list of discharge medications.  Relevant Imaging Results:  Relevant Lab Results:   Additional Information SSN: [place], [place]

## 2016-03-19 NOTE — Discharge Instructions (Signed)

## 2016-03-20 ENCOUNTER — Inpatient Hospital Stay (HOSPITAL_COMMUNITY): Payer: Medicare Other

## 2016-03-20 DIAGNOSIS — F29 Unspecified psychosis not due to a substance or known physiological condition: Secondary | ICD-10-CM

## 2016-03-20 DIAGNOSIS — R0782 Intercostal pain: Secondary | ICD-10-CM

## 2016-03-20 MED ORDER — IBUPROFEN 600 MG PO TABS: 600.0000 mg | ORAL_TABLET | Freq: Three times a day (TID) | ORAL | Status: DC | PRN

## 2016-03-20 MED ORDER — CIPROFLOXACIN HCL 250 MG PO TABS: 250.0000 mg | ORAL_TABLET | Freq: Two times a day (BID) | ORAL | Status: AC

## 2016-03-20 MED ORDER — IBUPROFEN 600 MG PO TABS
600.0000 mg | ORAL_TABLET | Freq: Four times a day (QID) | ORAL | Status: DC | PRN
  Administered 2016-03-20: 600 mg via ORAL
  Filled 2016-03-20: qty 1

## 2016-03-20 NOTE — Care Management Note (Signed)
Case Management Note  Patient Details  Name: Monica Strong MRN: [id number] Date of Birth: August 12, 1931  Subjective/Objective:                    Action/Plan: Plan is to d/c to SNF [facility]) today.  Expected Discharge Date:   03/20/2016               Expected Discharge Plan:  [place]  In-House Referral:  Clinical Social Work  Discharge planning Services  CM Consult   Status of Service:  Completed, signed off  If discussed at [employer] of Stay Meetings, dates discussed:    Additional Comments:  Sharin Mons, RN 03/20/2016, 1:13 PM

## 2016-03-20 NOTE — Discharge Summary (Signed)
Physician Discharge Summary  Monica Strong [id number] DOB: 1932-02-01 DOA: 03/17/2016  PCP: Vena Austria, MD  Admit date: 03/17/2016 Discharge date: 03/20/2016  Admitted From: ALF Disposition:  SNF  Recommendations for Outpatient Follow-up:  1. Follow up with PCP in 1 week 2. May need to titrate Cardizem for blood pressure as she had an episode of soft blood pressures. 3. Consider outpatient echocardiogram to reassess heart function 4. Recommend removing stable in one day 5. Outpatient cardiology follow-up 6. Reassess need for anticoagulation if she continues to have falls  Discharge Condition: Stable CODE STATUS: Full code Diet recommendation: Heart healthy   Brief/Interim Summary:  HPI written by Dr. Lottie Mussel on 03/17/2016  Monica Strong is a 82 y.o. female  [facility] was found down this morning with a laceration to the back of her head, unwitnessed fall.  There was noted some blood on the area. EMS was called and she was brought into the ED for further evaluation. Daughters here at bedside, and able to give some history. Per daughter, patient has a history of dementia, psychosis and new medications have been adjusted in June/July, including Risperdal, but no new meds since.  She also has a history of untreated sleep apnea as well.  Pt does not recall falling, c/o of possible dysuria, but does not recall. Poor historian. Denies acute pain at this time.  Assessment palpitations. Respiratory no subjective fevers. Denies nausea, vomiting, diarrhea, constipation, hematuria, eats well with no complaints.  Patient has chronic venous stasis syndrome. She sees a dermatologist for her lower extremity skin issues as well per dgt.  Sees home visiting physicians as well.   Hospital course:  Urinary tract infection Patient with a history of ESBL. Initially started on ciprofloxacin empirically, which was switched to meropenem. Urine culture pan-sensitive and  patient switched to ciprofloxacin by mouth. Last dose should be given 03/21/2016  Left chest pain Troponin trended and stable. EKG significant for atrial fibrillation, rate controlled, T wave changes are unchanged. Cardiology consulted and recommended outpatient follow-up. GI cocktail given which did not help symptoms. Exam consistent with costochondritis. Ibuprofen given for symptoms.  Head laceration Suffered secondary to fall. Staple placed. Asymptomatic. Stable.  Chronic atrial fibrillation Rate controlled. Continued Xarelto and diltiazem  Altered mental status/dementia/psychosis Patient has a chronic history. TSH, B12 within normal limits. RPR non-reactive. Home risperdal, cogentin and zoloft continued  OSA Morbid obesity Heart healthy diet  Discharge Diagnoses:  Principal Problem:   UTI (urinary tract infection) Active Problems:   Morbid obesity ([place])   Obstructive sleep apnea   Atrial fibrillation ([facility])   Altered mental state   Psychosis   Dementia   Laceration of head   Chronic venous stasis dermatitis   Chest pain    Discharge Instructions     Medication List    TAKE these medications   acetaminophen 325 MG tablet Commonly known as:  TYLENOL Take 650 mg by mouth every 6 (six) hours as needed for mild pain.   benztropine 0.5 MG tablet Commonly known as:  COGENTIN Take 1 tablet (0.5 mg total) by mouth 2 (two) times daily.   ciprofloxacin 250 MG tablet Commonly known as:  CIPRO Take 1 tablet (250 mg total) by mouth 2 (two) times daily. Last doses to be given on 03/21/2016   DELSYM 30 MG/5ML liquid Generic drug:  dextromethorphan Take 60 mg by mouth every 12 (twelve) hours as needed for cough.   diltiazem 180 MG 24 hr capsule Commonly known as:  DILACOR XR Take 180 mg by mouth daily.   furosemide 40 MG tablet Commonly known as:  LASIX Take 40 mg by mouth daily.   ibuprofen 600 MG tablet Commonly known as:  ADVIL,MOTRIN Take 1 tablet (600 mg  total) by mouth every 8 (eight) hours as needed for mild pain or moderate pain.   MULTIVITAMIN PO Take 1 tablet by mouth daily.   polyethylene glycol packet Commonly known as:  MIRALAX / GLYCOLAX Take 17 g by mouth daily.   ranitidine 300 MG tablet Commonly known as:  ZANTAC Take 300 mg by mouth at bedtime.   risperiDONE 0.5 MG tablet Commonly known as:  RISPERDAL Take 0.5 mg by mouth every morning.   risperiDONE 2 MG disintegrating tablet Commonly known as:  RISPERDAL M-TABS Take 1 tablet (2 mg total) by mouth at bedtime.   rivaroxaban 20 MG Tabs tablet Commonly known as:  XARELTO Take 20 mg by mouth daily.   sertraline 25 MG tablet Commonly known as:  ZOLOFT Take 25 mg by mouth daily.   Vitamin D3 2000 units capsule Take 2,000 Units by mouth daily.       Allergies  Allergen Reactions  . Diphenhydramine Nausea Only    Sick to stomach  . Flagyl [Metronidazole] Other (See Comments)    HALLUCINATIONS  . Hydrocodone Other (See Comments)    HALLUCINATIONS  . Meperidine Hcl Nausea And Vomiting  . Penicillins Hives  . Sulfonamide Derivatives Hives    Consultations:  Cardiology   Procedures/Studies: Dg Chest 2 View  Result Date: 03/19/2016 CLINICAL DATA:  Chest pain EXAM: CHEST  2 VIEW COMPARISON:  10/09/2014 FINDINGS: Cardiac enlargement. Negative for heart failure or edema. No pleural effusion. Negative for mass or pneumonia. Motion degrades the lateral view. Right medial apex density most compatible with vascular ectasia as noted on prior chest CT IMPRESSION: No active cardiopulmonary disease. Electronically Signed   By: Franchot Gallo M.D.   On: 03/19/2016 15:50   Dg Shoulder Right  Result Date: 03/17/2016 CLINICAL DATA:  Pain following recent fall EXAM: RIGHT SHOULDER - 2+ VIEW COMPARISON:  None. FINDINGS: Frontal and Y scapular images were obtained. There is no demonstrable fracture or dislocation. There is moderate generalized osteoarthritic change. No  erosive change. Visualized right lung is clear. IMPRESSION: Moderate generalized osteoarthritic change. No fracture or dislocation. Electronically Signed   By: Lowella Grip III M.D.   On: 03/17/2016 10:01   Dg Tibia/fibula Right  Result Date: 03/20/2016 CLINICAL DATA:  Tenderness.  No known injury. EXAM: RIGHT TIBIA AND FIBULA - 2 VIEW COMPARISON:  01/05/2015 FINDINGS: Stable appearance of total right knee arthroplasty. No complicating features identified. The knee is located. No evidence of joint effusion. The tibia and fibula are intact. No acute or healing fracture or focal bony abnormality. Diffuse osteopenia is noted, unchanged. IMPRESSION: Total right knee arthroplasty without complicating feature. No acute bony abnormality. Chronic diffuse osteopenia. Electronically Signed   By: Curlene Dolphin M.D.   On: 03/20/2016 10:19   Ct Head Wo Contrast  Result Date: 03/17/2016 CLINICAL DATA:  Fall with posterior scalp laceration. History of dementia. Initial encounter. EXAM: CT HEAD WITHOUT CONTRAST CT CERVICAL SPINE WITHOUT CONTRAST TECHNIQUE: Multidetector CT imaging of the head and cervical spine was performed following the standard protocol without intravenous contrast. Multiplanar CT image reconstructions of the cervical spine were also generated. COMPARISON:  CT of the head on 01/05/2015 FINDINGS: CT HEAD FINDINGS Brain: No evidence of acute infarction, hemorrhage, hydrocephalus, extra-axial collection or mass  lesion/mass effect. Vascular: No hyperdense vessel or unexpected calcification. Skull: Normal. Negative for fracture or focal lesion. Sinuses/Orbits: No acute finding. Other: Scalp soft tissue swelling over the right lateral vertex. CT CERVICAL SPINE FINDINGS Alignment: Normal. Skull base and vertebrae: No acute fracture. No primary bone lesion or focal pathologic process. Soft tissues and spinal canal: No prevertebral fluid or swelling. No visible canal hematoma. Visualized airway is normally  patent. No incidental masses identified. Disc levels: Moderate disc space narrowing and proliferative disease at C5-6. Milder spondylosis at C6-7 with suggestion of minimal anterolisthesis of C6 on C7. Upper chest: Negative. IMPRESSION: 1. Right lateral vertex scalp soft tissue swelling without evidence of acute brain injury or skull fracture. 2. No evidence of acute cervical injury. Moderate cervical spondylosis at C5-6 and milder spondylosis at C6-7. Electronically Signed   By: Aletta Edouard M.D.   On: 03/17/2016 10:13   Ct Cervical Spine Wo Contrast  Result Date: 03/17/2016 CLINICAL DATA:  Fall with posterior scalp laceration. History of dementia. Initial encounter. EXAM: CT HEAD WITHOUT CONTRAST CT CERVICAL SPINE WITHOUT CONTRAST TECHNIQUE: Multidetector CT imaging of the head and cervical spine was performed following the standard protocol without intravenous contrast. Multiplanar CT image reconstructions of the cervical spine were also generated. COMPARISON:  CT of the head on 01/05/2015 FINDINGS: CT HEAD FINDINGS Brain: No evidence of acute infarction, hemorrhage, hydrocephalus, extra-axial collection or mass lesion/mass effect. Vascular: No hyperdense vessel or unexpected calcification. Skull: Normal. Negative for fracture or focal lesion. Sinuses/Orbits: No acute finding. Other: Scalp soft tissue swelling over the right lateral vertex. CT CERVICAL SPINE FINDINGS Alignment: Normal. Skull base and vertebrae: No acute fracture. No primary bone lesion or focal pathologic process. Soft tissues and spinal canal: No prevertebral fluid or swelling. No visible canal hematoma. Visualized airway is normally patent. No incidental masses identified. Disc levels: Moderate disc space narrowing and proliferative disease at C5-6. Milder spondylosis at C6-7 with suggestion of minimal anterolisthesis of C6 on C7. Upper chest: Negative. IMPRESSION: 1. Right lateral vertex scalp soft tissue swelling without evidence of  acute brain injury or skull fracture. 2. No evidence of acute cervical injury. Moderate cervical spondylosis at C5-6 and milder spondylosis at C6-7. Electronically Signed   By: Aletta Edouard M.D.   On: 03/17/2016 10:13   Dg Knee Complete 4 Views Left  Result Date: 03/17/2016 CLINICAL DATA:  Pain following fall EXAM: LEFT KNEE - COMPLETE 4+ VIEW COMPARISON:  None. FINDINGS: Frontal, lateral, and bilateral oblique views were obtained. Patient is status post total knee arthroplasty with femoral and tibial prosthetic components appearing well-seated. No acute fracture or dislocation. No knee joint effusion. No erosive change. IMPRESSION: Prosthetic components appear well seated. No acute fracture or dislocation. No erosive change or joint effusion. Electronically Signed   By: Lowella Grip III M.D.   On: 03/17/2016 10:02   Dg Hips Bilat W Or Wo Pelvis 3-4 Views  Result Date: 03/17/2016 CLINICAL DATA:  Pain following fall EXAM: DG HIP (WITH OR WITHOUT PELVIS) 3-4V BILAT COMPARISON:  None. FINDINGS: Frontal pelvis as well as frontal and lateral hip views bilaterally -total five views -obtained. No evident fracture or dislocation. There is mild symmetric narrowing of both hip joints. No erosive change. There is mild osteoarthritic change in each sacroiliac joint. Bones appear somewhat osteoporotic. IMPRESSION: Mild narrowing of both hip joints. Mild osteoarthritic change in each sacroiliac joint. No fracture dislocation. Bones appear somewhat osteoporotic. Electronically Signed   By: Lowella Grip III M.D.  On: 03/17/2016 10:03     Subjective: Patient reports chest pain today. No other complaints  Discharge Exam: Vitals:   03/20/16 0600 03/20/16 0655  BP: (!) 159/96 (!) 159/96  Pulse:  88  Resp:  18  Temp:  99.1 F (37.3 C)   Vitals:   03/19/16 1455 03/19/16 2044 03/20/16 0600 03/20/16 0655  BP:  (!) 159/92 (!) 159/96 (!) 159/96  Pulse:  89  88  Resp:  18  18  Temp: 98.3 F (36.8 C)  98.2 F (36.8 C)  99.1 F (37.3 C)  TempSrc: Oral Oral  Oral  SpO2:  94%  98%  Weight:      Height:        General exam: Appears calm and comfortable Respiratory system: Clear to auscultation. Respiratory effort normal. Cardiovascular system: S1 & S2 heard, irregular rhythm, normal rate. No murmurs, rubs, gallops or clicks. Gastrointestinal system: Abdomen is nondistended, soft and nontender. Normal bowel sounds heard. Central nervous system: Alert and oriented to person only. No focal neurological deficits. Musculoskeletal: Reproduced chest pain. No edema. No calf tenderness Skin: No cyanosis. No rashes. Single staple on right posterior skull Psychiatry: Judgement and insight appear impaired. Mood & affect slightly depressed and flat.    The results of significant diagnostics from this hospitalization (including imaging, microbiology, ancillary and laboratory) are listed below for reference.     Microbiology: Recent Results (from the past 240 hour(s))  Urine culture     Status: Abnormal   Collection Time: 03/17/16 12:40 PM  Result Value Ref Range Status   Specimen Description URINE, CATHETERIZED  Final   Special Requests NONE  Final   Culture >=100,000 COLONIES/mL ESCHERICHIA COLI (A)  Final   Report Status 03/19/2016 FINAL  Final   Organism ID, Bacteria ESCHERICHIA COLI (A)  Final      Susceptibility   Escherichia coli - MIC*    AMPICILLIN <=2 SENSITIVE Sensitive     CEFAZOLIN <=4 SENSITIVE Sensitive     CEFTRIAXONE <=1 SENSITIVE Sensitive     CIPROFLOXACIN 1 SENSITIVE Sensitive     GENTAMICIN <=1 SENSITIVE Sensitive     IMIPENEM <=0.25 SENSITIVE Sensitive     NITROFURANTOIN <=16 SENSITIVE Sensitive     TRIMETH/SULFA <=20 SENSITIVE Sensitive     AMPICILLIN/SULBACTAM <=2 SENSITIVE Sensitive     PIP/TAZO <=4 SENSITIVE Sensitive     Extended ESBL NEGATIVE Sensitive     * >=100,000 COLONIES/mL ESCHERICHIA COLI  MRSA PCR Screening     Status: None   Collection Time:  03/18/16  1:02 PM  Result Value Ref Range Status   MRSA by PCR NEGATIVE NEGATIVE Final    Comment:        The GeneXpert MRSA Assay (FDA approved for NASAL specimens only), is one component of a comprehensive MRSA colonization surveillance program. It is not intended to diagnose MRSA infection nor to guide or monitor treatment for MRSA infections.      Labs: BNP (last 3 results) No results for input(s): BNP in the last 8760 hours. Basic Metabolic Panel:  Recent Labs Lab 03/17/16 0815 03/18/16 0521  NA 140 139  K 4.1 3.5  CL 109 104  CO2 25 28  GLUCOSE 110* 101*  BUN 19 16  CREATININE 0.95 0.95  CALCIUM 9.5 9.2   Liver Function Tests:  Recent Labs Lab 03/17/16 0815  AST 16  ALT 12*  ALKPHOS 69  BILITOT 0.6  PROT 5.9*  ALBUMIN 3.4*   No results for input(s): LIPASE, AMYLASE  in the last 168 hours. No results for input(s): AMMONIA in the last 168 hours. CBC:  Recent Labs Lab 03/17/16 0815 03/18/16 0521 03/18/16 1229  WBC 7.7 7.5  --   NEUTROABS 5.4  --   --   HGB 13.6 14.4  --   HCT 42.7 45.7 41.1  MCV 99.3 99.3  --   PLT 174 196  --    Cardiac Enzymes:  Recent Labs Lab 03/18/16 1506 03/18/16 2037 03/19/16 0231 03/19/16 1128  TROPONINI 0.18* 0.16* 0.19* 0.14*   BNP: Invalid input(s): POCBNP CBG: No results for input(s): GLUCAP in the last 168 hours. D-Dimer No results for input(s): DDIMER in the last 72 hours. Hgb A1c No results for input(s): HGBA1C in the last 72 hours. Lipid Profile No results for input(s): CHOL, HDL, LDLCALC, TRIG, CHOLHDL, LDLDIRECT in the last 72 hours. Thyroid function studies  Recent Labs  03/17/16 1542  TSH 0.798   Anemia work up  Recent Labs  03/17/16 1542  VITAMINB12 409   Urinalysis    Component Value Date/Time   COLORURINE YELLOW 03/17/2016 1240   APPEARANCEUR CLOUDY (A) 03/17/2016 1240   LABSPEC 1.015 03/17/2016 1240   PHURINE 7.5 03/17/2016 1240   GLUCOSEU NEGATIVE 03/17/2016 1240   HGBUR  NEGATIVE 03/17/2016 1240   [place] 03/17/2016 1240   [place] 03/17/2016 1240   PROTEINUR NEGATIVE 03/17/2016 1240   UROBILINOGEN 0.2 01/05/2015 1539   NITRITE POSITIVE (A) 03/17/2016 1240   LEUKOCYTESUR MODERATE (A) 03/17/2016 1240   Sepsis Labs Invalid input(s): PROCALCITONIN,  WBC,  LACTICIDVEN Microbiology Recent Results (from the past 240 hour(s))  Urine culture     Status: Abnormal   Collection Time: 03/17/16 12:40 PM  Result Value Ref Range Status   Specimen Description URINE, CATHETERIZED  Final   Special Requests NONE  Final   Culture >=100,000 COLONIES/mL ESCHERICHIA COLI (A)  Final   Report Status 03/19/2016 FINAL  Final   Organism ID, Bacteria ESCHERICHIA COLI (A)  Final      Susceptibility   Escherichia coli - MIC*    AMPICILLIN <=2 SENSITIVE Sensitive     CEFAZOLIN <=4 SENSITIVE Sensitive     CEFTRIAXONE <=1 SENSITIVE Sensitive     CIPROFLOXACIN 1 SENSITIVE Sensitive     GENTAMICIN <=1 SENSITIVE Sensitive     IMIPENEM <=0.25 SENSITIVE Sensitive     NITROFURANTOIN <=16 SENSITIVE Sensitive     TRIMETH/SULFA <=20 SENSITIVE Sensitive     AMPICILLIN/SULBACTAM <=2 SENSITIVE Sensitive     PIP/TAZO <=4 SENSITIVE Sensitive     Extended ESBL NEGATIVE Sensitive     * >=100,000 COLONIES/mL ESCHERICHIA COLI  MRSA PCR Screening     Status: None   Collection Time: 03/18/16  1:02 PM  Result Value Ref Range Status   MRSA by PCR NEGATIVE NEGATIVE Final    Comment:        The GeneXpert MRSA Assay (FDA approved for NASAL specimens only), is one component of a comprehensive MRSA colonization surveillance program. It is not intended to diagnose MRSA infection nor to guide or monitor treatment for MRSA infections.      Time coordinating discharge: Over 30 minutes  SIGNED:   Cordelia Poche, MD [facility] 03/20/2016, 12:10 PM Pager [phone]  If 7PM-7AM, please contact night-coverage [email or web address] Password [email or web address]

## 2016-03-20 NOTE — Progress Notes (Signed)
Physical Therapy Treatment Patient Details Name: Monica Strong MRN: [id number] DOB: 1931/08/06 Today's Date: 03/20/2016    History of Present Illness Monica Strong is a 82 y.o. female with a history of dementia, atrial fibrillation, morbid obesity, psychosis, history of PE, hypertension. She presents from [facility]. She was found down with a laceration, which was stapled in the ED and found to have a UTI.     PT Comments    Patient continues to demonstrate strength, balance, and cognitive deficits limiting mobility. Recommending ST-SNF for further skilled PT services to maximize independence and safety with mobility.   Follow Up Recommendations  SNF;Supervision/Assistance - 24 hour     Equipment Recommendations   (TBD )    Recommendations for Other Services OT consult     Precautions / Restrictions Precautions Precautions: Fall    Mobility  Bed Mobility Overal bed mobility: Needs Assistance Bed Mobility: Rolling;Sidelying to Sit;Sit to Supine Rolling: Min assist Sidelying to sit: Max assist   Sit to supine: Max assist;+2 for safety/equipment   General bed mobility comments: multimodal cues for sequencing and technique with hand over hand to reach for bed rail; assist to roll and maintain sidelying and elevate trunk  into sitting; pt very confused when returning to supine and RN present and assisted with positioning trunk   Transfers Overall transfer level: Needs assistance Equipment used: Rolling walker (2 wheeled) Transfers: Sit to/from Stand Sit to Stand: Mod assist         General transfer comment: multimodal cues for hand placement; assist to power up into standing and maintain balance upon stand  Ambulation/Gait Ambulation/Gait assistance: Mod assist Ambulation Distance (Feet): 4 Feet Assistive device: Rolling walker (2 wheeled) Gait Pattern/deviations: Step-to pattern;Shuffle     General Gait Details: side steps up to [facility] for ~4 ft; assist to  manage RW, for balance, and for weight shifting; max multimodal cues to attend to task; stayed close to bed for safety as pt has tendency to flex trunk and attempt to sit without max cues for upright posture and stepping   Stairs            Wheelchair Mobility    Modified Rankin (Stroke Patients Only)       Balance     Sitting balance-Leahy Scale: Fair       Standing balance-Leahy Scale: Poor                      Cognition Arousal/Alertness: Awake/alert Behavior During Therapy: WFL for tasks assessed/performed Overall Cognitive Status: Impaired/Different from baseline Area of Impairment: Following commands;Memory;Safety/judgement;Problem solving;Orientation Orientation Level: Disoriented to;Place;Time;Situation   Memory:  (Dementia) Following Commands: Follows one step commands with increased time Safety/Judgement: Decreased awareness of safety;Decreased awareness of deficits   Problem Solving: Slow processing;Decreased initiation;Difficulty sequencing;Requires verbal cues      Exercises      General Comments General comments (skin integrity, edema, etc.): incontinent of urine upon arrival      Pertinent Vitals/Pain Pain Assessment: No/denies pain    Home Living                      Prior Function            PT Goals (current goals can now be found in the care plan section) Acute Rehab PT Goals Patient Stated Goal: did not state PT Goal Formulation: With patient/family Time For Goal Achievement: 04/01/16 Potential to Achieve Goals: Good Progress towards PT goals:  Progressing toward goals    Frequency    Min 3X/week      PT Plan Current plan remains appropriate    Co-evaluation             End of Session Equipment Utilized During Treatment: Gait belt Activity Tolerance: Patient tolerated treatment well Patient left: with call bell/phone within reach;in bed;with bed alarm set;with nursing/sitter in room     Time:  1421-1453 PT Time Calculation (min) (ACUTE ONLY): 32 min  Charges:  $Therapeutic Activity: 23-37 mins                    G Codes:      Salina April, PTA Pager: [phone]   03/20/2016, 4:14 PM

## 2016-03-20 NOTE — Progress Notes (Signed)
Pt prepared for d/c to SNF. IV d/c'd. Skin intact except as charted in most recent assessments. Vitals are stable. Report called to receiving facility. Pt to be transported by ambulance service. 

## 2016-03-20 NOTE — Progress Notes (Signed)
Patient will discharge to [facility] Anticipated discharge date: 10/10 Family notified: dtr at bedside Transportation by PTAR- called at 4:10pm  CSW signing off.  Jorge Ny, [place] Social Worker [phone]

## 2016-03-20 NOTE — Clinical Social Work Placement (Signed)
   CLINICAL SOCIAL WORK PLACEMENT  NOTE  Date:  03/20/2016  Patient Details  Name: Monica Strong MRN: [id number] Date of Birth: 07/24/1931  Clinical Social Work is seeking post-discharge placement for this patient at the [place] level of care (*CSW will initial, date and re-position this form in  chart as items are completed):  Yes   Patient/family provided with [facility] Work Department's list of facilities offering this level of care within the geographic area requested by the patient (or if unable, by the patient's family).  Yes   Patient/family informed of their freedom to choose among providers that offer the needed level of care, that participate in Medicare, Medicaid or managed care program needed by the patient, have an available bed and are willing to accept the patient.  Yes   Patient/family informed of [facility]'s ownership interest in [facility] and [facility], as well as of the fact that they are under no obligation to receive care at these facilities.  PASRR submitted to EDS on       PASRR number received on       Existing PASRR number confirmed on 03/19/16     FL2 transmitted to all facilities in geographic area requested by pt/family on 03/19/16     FL2 transmitted to all facilities within larger geographic area on       Patient informed that his/her managed care company has contracts with or will negotiate with certain facilities, including the following:        Yes   Patient/family informed of bed offers received.  Patient chooses bed at [facility]     Physician recommends and patient chooses bed at      Patient to be transferred to [facility] on 03/20/16.  Patient to be transferred to facility by ptar     Patient family notified on 03/20/16 of transfer.  Name of family member notified:  robin     PHYSICIAN Please sign FL2     Additional Comment:     _______________________________________________ Jorge Ny, LCSW 03/20/2016, 4:13 PM

## 2016-03-21 ENCOUNTER — Non-Acute Institutional Stay (SKILLED_NURSING_FACILITY): Payer: Medicare Other | Admitting: Adult Health

## 2016-03-21 ENCOUNTER — Encounter: Payer: Self-pay | Admitting: Adult Health

## 2016-03-21 DIAGNOSIS — F329 Major depressive disorder, single episode, unspecified: Secondary | ICD-10-CM | POA: Diagnosis not present

## 2016-03-21 DIAGNOSIS — K219 Gastro-esophageal reflux disease without esophagitis: Secondary | ICD-10-CM | POA: Diagnosis not present

## 2016-03-21 DIAGNOSIS — E441 Mild protein-calorie malnutrition: Secondary | ICD-10-CM

## 2016-03-21 DIAGNOSIS — I482 Chronic atrial fibrillation, unspecified: Secondary | ICD-10-CM

## 2016-03-21 DIAGNOSIS — F29 Unspecified psychosis not due to a substance or known physiological condition: Secondary | ICD-10-CM

## 2016-03-21 DIAGNOSIS — F32A Depression, unspecified: Secondary | ICD-10-CM

## 2016-03-21 DIAGNOSIS — R4182 Altered mental status, unspecified: Secondary | ICD-10-CM

## 2016-03-21 DIAGNOSIS — K5901 Slow transit constipation: Secondary | ICD-10-CM | POA: Diagnosis not present

## 2016-03-21 DIAGNOSIS — N390 Urinary tract infection, site not specified: Secondary | ICD-10-CM

## 2016-03-21 DIAGNOSIS — R079 Chest pain, unspecified: Secondary | ICD-10-CM

## 2016-03-21 DIAGNOSIS — G259 Extrapyramidal and movement disorder, unspecified: Secondary | ICD-10-CM

## 2016-03-21 DIAGNOSIS — R531 Weakness: Secondary | ICD-10-CM

## 2016-03-21 DIAGNOSIS — F0391 Unspecified dementia with behavioral disturbance: Secondary | ICD-10-CM

## 2016-03-21 DIAGNOSIS — I509 Heart failure, unspecified: Secondary | ICD-10-CM | POA: Diagnosis not present

## 2016-03-21 DIAGNOSIS — S0101XS Laceration without foreign body of scalp, sequela: Secondary | ICD-10-CM

## 2016-03-21 NOTE — Progress Notes (Addendum)
Patient ID: Monica Strong, female   DOB: 23-May-1932, 82 y.o.   MRN: [id number]    DATE:  03/21/2016   MRN:  [id number]  BIRTHDAY: 1932-01-20  Facility:  Nursing Home Location:  [place] Room Number: 804-B  LEVEL OF CARE:  SNF [phone])  Contact Information    Name Relation Home Work Mobile   McSwain,[name] Daughter [phone]  [phone]   McSwain,Tim Other [phone]  [phone]       Code Status History    Date Active Date Inactive Code Status Order ID Comments User Context   03/17/2016  3:07 PM 03/20/2016  9:12 PM Full Code [id number]  Maren Reamer, MD ED   01/05/2015  7:01 PM 01/07/2015  6:31 PM DNR [id number]  Melton Alar, PA-C Inpatient   04/15/2014  4:03 PM 04/20/2014  8:40 PM Full Code [id number]  Oswald Hillock, MD Inpatient   04/05/2014  5:25 PM 04/06/2014  7:23 PM Full Code [id number]  Delfina Redwood, MD Inpatient   02/17/2014  2:07 AM 02/23/2014  9:31 PM Full Code [id number]  Allyne Gee, MD Inpatient       Chief Complaint  Patient presents with  . Hospitalization Follow-up    HISTORY OF PRESENT ILLNESS:  This is an 82 year old female who has PMH of dementia, psychosis and untreated sleep apnea. She has been admitted to [facility] on 03/20/16 from [facility] . She lives in an ALF wherein she was found down. She sustained a head laceration. She was diagnosed to have UTI and empirically treated with Ciprofloxacin and was switched to Meropenem. She was switched back to Ciprofloxacin PO with urine culture showing pan-sensitive. She complained of chest pain with EKG showing atrial fibrillation. It was thought to be from costochondritis. Staple was placed on her head laceration.  She is seen today in her room with her eyes shut but was verbally responsive. She was reported to have not slept well last night. CBG 118 and BP 137/97. She verbalized that she is "OK".  She has been admitted for a short-term  rehabilitation.   PAST MEDICAL HISTORY:  Past Medical History:  Diagnosis Date  . Arthritis    "hands" (01/05/2015)  . Atrial fibrillation ([place])   . Chest pain    a. Normal stress test 08/2008 and CTA neg for PE at that time.  . Dysuria    has urethral bump  . GERD (gastroesophageal reflux disease)   . H/O hiatal hernia   . Heart murmur MILD -- ASYMPTOMATIC  . History of infection due to ESBL Escherichia coli 02/23/2014  . Hypertension   . Inner ear dysfunction    a. Prior h/o vertigo.  Marland Kitchen Neuromuscular disorder (HCC)    rt hand numbness  . Obesity   . OSA (obstructive sleep apnea)    not using cpap (01/05/2015)  . Paranoia ([place])   . Poor short term memory   . Psychotic episode    "was hospitalized at [facility] 07/2014; was having hallucinations; on RX now" (01/05/2015)  . Pulmonary embolism ([place]) ~ 05/2014  . Pulmonary nodules BENIGN--  MONITORED BY PCP DR READE--  ASYMPTOMATIC    LAST CHEST CT 08-17-2011  . Renal stones left  . Sinus drainage   . Ureteral stent retained   . Urge urinary incontinence      CURRENT MEDICATIONS: Reviewed  Patient's Medications  New Prescriptions   No medications on file  Previous Medications   ACETAMINOPHEN (TYLENOL)  325 MG TABLET    Take 650 mg by mouth every 6 (six) hours as needed for mild pain.   BENZTROPINE (COGENTIN) 0.5 MG TABLET    Take 1 tablet (0.5 mg total) by mouth 2 (two) times daily.   CHOLECALCIFEROL (VITAMIN D3) 2000 UNITS CAPSULE    Take 2,000 Units by mouth daily.   CIPROFLOXACIN (CIPRO) 250 MG TABLET    Take 1 tablet (250 mg total) by mouth 2 (two) times daily. Last doses to be given on 03/21/2016   DEXTROMETHORPHAN (DELSYM) 30 MG/5ML LIQUID    Take 60 mg by mouth every 12 (twelve) hours as needed for cough.   DILTIAZEM (DILACOR XR) 180 MG 24 HR CAPSULE    Take 180 mg by mouth daily.   FUROSEMIDE (LASIX) 40 MG TABLET    Take 40 mg by mouth daily.    IBUPROFEN (ADVIL,MOTRIN) 600 MG TABLET    Take 1 tablet (600 mg total) by  mouth every 8 (eight) hours as needed for mild pain or moderate pain.   MULTIPLE VITAMINS-MINERALS (MULTIVITAMIN PO)    Take 1 tablet by mouth daily.   POLYETHYLENE GLYCOL (MIRALAX / GLYCOLAX) PACKET    Take 17 g by mouth daily.   RANITIDINE (ZANTAC) 300 MG TABLET    Take 300 mg by mouth at bedtime.    RISPERIDONE (RISPERDAL M-TABS) 2 MG DISINTEGRATING TABLET    Take 1 tablet (2 mg total) by mouth at bedtime.   RISPERIDONE (RISPERDAL) 0.5 MG TABLET    Take 0.5 mg by mouth every morning.    RIVAROXABAN (XARELTO) 20 MG TABS TABLET    Take 20 mg by mouth daily.   SERTRALINE (ZOLOFT) 25 MG TABLET    Take 25 mg by mouth daily.  Modified Medications   No medications on file  Discontinued Medications   No medications on file     Allergies  Allergen Reactions  . Diphenhydramine Nausea Only    Sick to stomach  . Flagyl [Metronidazole] Other (See Comments)    HALLUCINATIONS  . Hydrocodone Other (See Comments)    HALLUCINATIONS  . Meperidine Hcl Nausea And Vomiting  . Penicillins Hives  . Sulfonamide Derivatives Hives     REVIEW OF SYSTEMS:  Unable to obtain due to being sleepy   PHYSICAL EXAMINATION  GENERAL APPEARANCE: Well nourished. In no acute distress. Obese SKIN:  Laceration on right top of head with 1 staple, dry blood and no erythema HEAD: Normal in size and contour. No evidence of trauma EYES: Lids open and close normally. No blepharitis, entropion or ectropion. PERRL. Conjunctivae are clear and sclerae are white. Lenses are without opacity EARS: Pinnae are normal. Patient hears normal voice tunes of the examiner MOUTH and THROAT: Lips are without lesions. Oral mucosa is moist and without lesions. Tongue is normal in shape, size, and color and without lesions NECK: supple, trachea midline, no neck masses, no thyroid tenderness, no thyromegaly LYMPHATICS: no LAN in the neck, no supraclavicular LAN RESPIRATORY: breathing is even & unlabored, BS CTAB CARDIAC: irregularly  irregular, no murmur,no extra heart sounds, no edema GI: abdomen soft, normal BS, no masses, no tenderness, no hepatomegaly, no splenomegaly EXTREMITIES:  Able to move X 4 extremities PSYCHIATRIC: Verbally responsive but with eyes closed. Sleepy.  LABS/RADIOLOGY: Labs reviewed: Basic Metabolic Panel:  Recent Labs  03/17/16 0815 03/18/16 0521  NA 140 139  K 4.1 3.5  CL 109 104  CO2 25 28  GLUCOSE 110* 101*  BUN 19 16  CREATININE  0.95 0.95  CALCIUM 9.5 9.2   Liver Function Tests:  Recent Labs  03/17/16 0815  AST 16  ALT 12*  ALKPHOS 69  BILITOT 0.6  PROT 5.9*  ALBUMIN 3.4*   CBC:  Recent Labs  03/17/16 0815 03/18/16 0521 03/18/16 1229  WBC 7.7 7.5  --   NEUTROABS 5.4  --   --   HGB 13.6 14.4  --   HCT 42.7 45.7 41.1  MCV 99.3 99.3  --   PLT 174 196  --    Cardiac Enzymes:  Recent Labs  03/18/16 2037 03/19/16 0231 03/19/16 1128  TROPONINI 0.16* 0.19* 0.14*      Dg Chest 2 View  Result Date: 03/19/2016 CLINICAL DATA:  Chest pain EXAM: CHEST  2 VIEW COMPARISON:  10/09/2014 FINDINGS: Cardiac enlargement. Negative for heart failure or edema. No pleural effusion. Negative for mass or pneumonia. Motion degrades the lateral view. Right medial apex density most compatible with vascular ectasia as noted on prior chest CT IMPRESSION: No active cardiopulmonary disease. Electronically Signed   By: Franchot Gallo M.D.   On: 03/19/2016 15:50   Dg Shoulder Right  Result Date: 03/17/2016 CLINICAL DATA:  Pain following recent fall EXAM: RIGHT SHOULDER - 2+ VIEW COMPARISON:  None. FINDINGS: Frontal and Y scapular images were obtained. There is no demonstrable fracture or dislocation. There is moderate generalized osteoarthritic change. No erosive change. Visualized right lung is clear. IMPRESSION: Moderate generalized osteoarthritic change. No fracture or dislocation. Electronically Signed   By: Lowella Grip III M.D.   On: 03/17/2016 10:01   Dg Tibia/fibula  Right  Result Date: 03/20/2016 CLINICAL DATA:  Tenderness.  No known injury. EXAM: RIGHT TIBIA AND FIBULA - 2 VIEW COMPARISON:  01/05/2015 FINDINGS: Stable appearance of total right knee arthroplasty. No complicating features identified. The knee is located. No evidence of joint effusion. The tibia and fibula are intact. No acute or healing fracture or focal bony abnormality. Diffuse osteopenia is noted, unchanged. IMPRESSION: Total right knee arthroplasty without complicating feature. No acute bony abnormality. Chronic diffuse osteopenia. Electronically Signed   By: Curlene Dolphin M.D.   On: 03/20/2016 10:19   Ct Head Wo Contrast  Result Date: 03/17/2016 CLINICAL DATA:  Fall with posterior scalp laceration. History of dementia. Initial encounter. EXAM: CT HEAD WITHOUT CONTRAST CT CERVICAL SPINE WITHOUT CONTRAST TECHNIQUE: Multidetector CT imaging of the head and cervical spine was performed following the standard protocol without intravenous contrast. Multiplanar CT image reconstructions of the cervical spine were also generated. COMPARISON:  CT of the head on 01/05/2015 FINDINGS: CT HEAD FINDINGS Brain: No evidence of acute infarction, hemorrhage, hydrocephalus, extra-axial collection or mass lesion/mass effect. Vascular: No hyperdense vessel or unexpected calcification. Skull: Normal. Negative for fracture or focal lesion. Sinuses/Orbits: No acute finding. Other: Scalp soft tissue swelling over the right lateral vertex. CT CERVICAL SPINE FINDINGS Alignment: Normal. Skull base and vertebrae: No acute fracture. No primary bone lesion or focal pathologic process. Soft tissues and spinal canal: No prevertebral fluid or swelling. No visible canal hematoma. Visualized airway is normally patent. No incidental masses identified. Disc levels: Moderate disc space narrowing and proliferative disease at C5-6. Milder spondylosis at C6-7 with suggestion of minimal anterolisthesis of C6 on C7. Upper chest: Negative.  IMPRESSION: 1. Right lateral vertex scalp soft tissue swelling without evidence of acute brain injury or skull fracture. 2. No evidence of acute cervical injury. Moderate cervical spondylosis at C5-6 and milder spondylosis at C6-7. Electronically Signed   By: Eulas Post  Kathlene Cote M.D.   On: 03/17/2016 10:13   Ct Cervical Spine Wo Contrast  Result Date: 03/17/2016 CLINICAL DATA:  Fall with posterior scalp laceration. History of dementia. Initial encounter. EXAM: CT HEAD WITHOUT CONTRAST CT CERVICAL SPINE WITHOUT CONTRAST TECHNIQUE: Multidetector CT imaging of the head and cervical spine was performed following the standard protocol without intravenous contrast. Multiplanar CT image reconstructions of the cervical spine were also generated. COMPARISON:  CT of the head on 01/05/2015 FINDINGS: CT HEAD FINDINGS Brain: No evidence of acute infarction, hemorrhage, hydrocephalus, extra-axial collection or mass lesion/mass effect. Vascular: No hyperdense vessel or unexpected calcification. Skull: Normal. Negative for fracture or focal lesion. Sinuses/Orbits: No acute finding. Other: Scalp soft tissue swelling over the right lateral vertex. CT CERVICAL SPINE FINDINGS Alignment: Normal. Skull base and vertebrae: No acute fracture. No primary bone lesion or focal pathologic process. Soft tissues and spinal canal: No prevertebral fluid or swelling. No visible canal hematoma. Visualized airway is normally patent. No incidental masses identified. Disc levels: Moderate disc space narrowing and proliferative disease at C5-6. Milder spondylosis at C6-7 with suggestion of minimal anterolisthesis of C6 on C7. Upper chest: Negative. IMPRESSION: 1. Right lateral vertex scalp soft tissue swelling without evidence of acute brain injury or skull fracture. 2. No evidence of acute cervical injury. Moderate cervical spondylosis at C5-6 and milder spondylosis at C6-7. Electronically Signed   By: Aletta Edouard M.D.   On: 03/17/2016 10:13   Dg  Knee Complete 4 Views Left  Result Date: 03/17/2016 CLINICAL DATA:  Pain following fall EXAM: LEFT KNEE - COMPLETE 4+ VIEW COMPARISON:  None. FINDINGS: Frontal, lateral, and bilateral oblique views were obtained. Patient is status post total knee arthroplasty with femoral and tibial prosthetic components appearing well-seated. No acute fracture or dislocation. No knee joint effusion. No erosive change. IMPRESSION: Prosthetic components appear well seated. No acute fracture or dislocation. No erosive change or joint effusion. Electronically Signed   By: Lowella Grip III M.D.   On: 03/17/2016 10:02   Dg Hips Bilat W Or Wo Pelvis 3-4 Views  Result Date: 03/17/2016 CLINICAL DATA:  Pain following fall EXAM: DG HIP (WITH OR WITHOUT PELVIS) 3-4V BILAT COMPARISON:  None. FINDINGS: Frontal pelvis as well as frontal and lateral hip views bilaterally -total five views -obtained. No evident fracture or dislocation. There is mild symmetric narrowing of both hip joints. No erosive change. There is mild osteoarthritic change in each sacroiliac joint. Bones appear somewhat osteoporotic. IMPRESSION: Mild narrowing of both hip joints. Mild osteoarthritic change in each sacroiliac joint. No fracture dislocation. Bones appear somewhat osteoporotic. Electronically Signed   By: Lowella Grip III M.D.   On: 03/17/2016 10:03    ASSESSMENT/PLAN:  Generalized weakness - for rehabilitation, PT and OT, for therapeutic strengthening exercises; fall precaution  UTI - continue Cipro 250 mg BID till 03/21/16  Left chest pain - troponin trended down and stable; was thought to be costochondritis; continue Ibuprofen 600 mg 1 tab PO Q 8 hours PRN  Head laceration - sustained from fall; continue wound treatment; keep skin clean and dry  Chronic atrial fibrillation - rate-controlled; continue Diltiazem 24HR CD 180 mg 1 capsule PO from Q AM and Xarelto 20 mg 1 tab PO Q D; check BMP in 1 week; BP/HR BID X 1 week  CHF - no  SOB; continue Lasix 40 mg 1 tab PO Q D; follow-up with cardiology for echocardiogram  Dementia with Psychosis -  continue supportive care; fall precaution; continue Rispridone 0.5 mg  PO Q AM and 2 mg Q HS; psychiatric consult with [facility]  EPS - continue Benztropine 0.5 mg 1 tab PO BID  Constipation - continue Miralax 17 gm PO Q D  GERD - continue Zantac 300 mg 1 tab PO Q HS  Depression - continue Zoloft 25 mg 1 tab PO Q D  Protein-calorie malnutrition - albumin 3.4; RD consult  Altered mental status - has chronic history; psychiatric consult    Goals of care:  Short-term rehabilitation     Durenda Age, NP [place]

## 2016-03-22 LAB — CBC AND DIFFERENTIAL
HEMATOCRIT: 46 % (ref 36–46)
HEMOGLOBIN: 14.9 g/dL (ref 12.0–16.0)
Neutrophils Absolute: 4 /uL
PLATELETS: 207 10*3/uL (ref 150–399)
WBC: 6.4 10*3/mL

## 2016-03-22 LAB — BASIC METABOLIC PANEL
BUN: 25 mg/dL — AB (ref 4–21)
CREATININE: 0.9 mg/dL (ref 0.5–1.1)
GLUCOSE: 93 mg/dL
Potassium: 4.1 mmol/L (ref 3.4–5.3)
Sodium: 148 mmol/L — AB (ref 137–147)

## 2016-03-23 ENCOUNTER — Encounter: Payer: Self-pay | Admitting: Internal Medicine

## 2016-03-23 ENCOUNTER — Non-Acute Institutional Stay (SKILLED_NURSING_FACILITY): Payer: Medicare Other | Admitting: Internal Medicine

## 2016-03-23 DIAGNOSIS — F0391 Unspecified dementia with behavioral disturbance: Secondary | ICD-10-CM | POA: Diagnosis not present

## 2016-03-23 DIAGNOSIS — I5032 Chronic diastolic (congestive) heart failure: Secondary | ICD-10-CM | POA: Diagnosis not present

## 2016-03-23 DIAGNOSIS — K219 Gastro-esophageal reflux disease without esophagitis: Secondary | ICD-10-CM

## 2016-03-23 DIAGNOSIS — S0101XS Laceration without foreign body of scalp, sequela: Secondary | ICD-10-CM

## 2016-03-23 DIAGNOSIS — I482 Chronic atrial fibrillation, unspecified: Secondary | ICD-10-CM

## 2016-03-23 DIAGNOSIS — N3000 Acute cystitis without hematuria: Secondary | ICD-10-CM

## 2016-03-23 DIAGNOSIS — R5381 Other malaise: Secondary | ICD-10-CM | POA: Diagnosis not present

## 2016-03-23 DIAGNOSIS — M940 Chondrocostal junction syndrome [Tietze]: Secondary | ICD-10-CM

## 2016-03-23 DIAGNOSIS — F329 Major depressive disorder, single episode, unspecified: Secondary | ICD-10-CM | POA: Diagnosis not present

## 2016-03-23 NOTE — Progress Notes (Signed)
LOCATION: [facility]  PCP: Vena Austria, MD   Code Status: Full Code  Goals of care: Advanced Directive information Advanced Directives 03/17/2016  Does patient have an advance directive? Yes  Type of Advance Directive [place]  Does patient want to make changes to advanced directive? -  Copy of advanced directive(s) in chart? No - copy requested  Would patient like information on creating an advanced directive? -  Pre-existing out of facility DNR order (yellow form or pink MOST form) -       Extended Emergency Contact Information Primary Emergency Contact: McSwain,[name] Address: [address]          [place], [place] [address] [place] Phone: [phone] Work Phone: [phone] Mobile Phone: [phone] Relation: Daughter Secondary Emergency Contact: McSwain,Tim Address: [address]          [place], [place] [address] [place] Phone: [phone] Work Phone: [phone] Mobile Phone: [phone] Relation: Other   Allergies  Allergen Reactions  . Diphenhydramine Nausea Only    Sick to stomach  . Flagyl [Metronidazole] Other (See Comments)    HALLUCINATIONS  . Hydrocodone Other (See Comments)    HALLUCINATIONS  . Meperidine Hcl Nausea And Vomiting  . Penicillins Hives  . Sulfonamide Derivatives Hives    Chief Complaint  Patient presents with  . New Admit To SNF    New Admission Visit     HPI:  Patient is a 82 y.o. female seen today for short term rehabilitation post hospital admission from 03/17/16-03/20/16 post fall with laceration to her scalp. She was diagnosed with UTI and placed on antibiotics. She had chest pain and troponin cycle was negative. There was concern for costochondritis and she was placed on ibuprofen. ekg showed afib with controlled heart rate. Cardiology has recommended outpatient follow up. She has PMH of dementia, psychosis and untreated sleep apnea.  She is seen in her room today.   Review of Systems:  Constitutional: Negative for fever, chills HENT: Negative for headache, congestion, nasal discharge Eyes: Negative for blurred vision  Respiratory: Negative for cough, shortness of breath.   Cardiovascular: Negative for chest pain, palpitations, leg swelling.  Gastrointestinal: Negative for heartburn, nausea, vomiting, abdominal pain. Last bowel movement was today. Genitourinary: Negative for dysuria and flank pain.  Musculoskeletal: Negative for back pain, fall in the facility.  Skin: Negative for itching, rash.  Neurological: Negative for dizziness. Psychiatric/Behavioral: Negative for depression    Past Medical History:  Diagnosis Date  . Arthritis    "hands" (01/05/2015)  . Atrial fibrillation ([place])   . Chest pain    a. Normal stress test 08/2008 and CTA neg for PE at that time.  . Dysuria    has urethral bump  . GERD (gastroesophageal reflux disease)   . H/O hiatal hernia   . Heart murmur MILD -- ASYMPTOMATIC  . History of infection due to ESBL Escherichia coli 02/23/2014  . Hypertension   . Inner ear dysfunction    a. Prior h/o vertigo.  Marland Kitchen Neuromuscular disorder (HCC)    rt hand numbness  . Obesity   . OSA (obstructive sleep apnea)    not using cpap (01/05/2015)  . Paranoia ([place])   . Poor short term memory   . Psychotic episode    "was hospitalized at [facility] 07/2014; was having hallucinations; on RX now" (01/05/2015)  . Pulmonary embolism ([place]) ~ 05/2014  . Pulmonary nodules BENIGN--  MONITORED BY PCP DR READE--  ASYMPTOMATIC  LAST CHEST CT 08-17-2011  . Renal stones left  . Sinus drainage   . Ureteral stent retained   . Urge urinary incontinence    Past Surgical History:  Procedure Laterality Date  . APPENDECTOMY     PT STATES PER XRAY SMALL AMOUNT OF APPENDIX LEFT  . BLEPHAROPLASTY Bilateral   . CARDIOVASCULAR STRESS TEST  03-13-2007  dr Tressia Miners turner   NORMAL LV SIZE SYSTOLIC FUCTION/ NO ISCHEMIA/ EF  85%  . CATARACT EXTRACTION W/ INTRAOCULAR LENS  IMPLANT, BILATERAL    . CYSTOSCOPY W/ RETROGRADES  07/11/2012   Procedure: CYSTOSCOPY WITH RETROGRADE PYELOGRAM;  Surgeon: Alexis Frock, MD;  Location: [facility];  Service: Urology;  Laterality: Left;  left third stage ureteroscopystone manipulation  . CYSTOSCOPY W/ URETERAL STENT PLACEMENT  06/13/2012   Procedure: CYSTOSCOPY WITH STENT REPLACEMENT;  Surgeon: Alexis Frock, MD;  Location: [facility];  Service: Urology;  Laterality: Left;  . CYSTOSCOPY W/ URETERAL STENT REMOVAL  07/11/2012   Procedure: CYSTOSCOPY WITH STENT REMOVAL;  Surgeon: Alexis Frock, MD;  Location: [facility];  Service: Urology;  Laterality: Left;  . CYSTOSCOPY WITH RETROGRADE PYELOGRAM, URETEROSCOPY AND STENT PLACEMENT  05/14/2012   Procedure: CYSTOSCOPY WITH RETROGRADE PYELOGRAM, URETEROSCOPY AND STENT PLACEMENT;  Surgeon: Alexis Frock, MD;  Location: [facility];  Service: Urology;  Laterality: Left;  1ST STAGE LEFT URETEROSCOPY, LEFT RETROGRADE, DIGITAL URETEROSCOPY,  LEFT STONE REMOVAL WITH ESCAPE BASKET,  STENT PLACEMENt   . CYSTOSCOPY/RETROGRADE/URETEROSCOPY/STONE EXTRACTION WITH BASKET  06/13/2012   Procedure: CYSTOSCOPY/RETROGRADE/URETEROSCOPY/STONE EXTRACTION WITH BASKET;  Surgeon: Alexis Frock, MD;  Location: [facility];  Service: Urology;  Laterality: Left;  . HOLMIUM LASER APPLICATION  [id number]   Procedure: HOLMIUM LASER APPLICATION;  Surgeon: Alexis Frock, MD;  Location: [facility];  Service: Urology;  Laterality: Left;  . HOLMIUM LASER APPLICATION  [id number]   Procedure: HOLMIUM LASER APPLICATION;  Surgeon: Alexis Frock, MD;  Location: [facility];  Service: Urology;  Laterality: Left;  . JOINT REPLACEMENT    . REMOVAL BREAST CYST, BENIGN  1960's  . TONSILLECTOMY    . TOTAL KNEE ARTHROPLASTY Left 10-17-2009  DR Wynelle Link  . TOTAL KNEE ARTHROPLASTY  Right 04-25-2009  . TRANSTHORACIC ECHOCARDIOGRAM  08-18-2008   NORMAL LVSF/ EF 55-60%/ MILD MITRAL REGURG .  Marland Kitchen URETEROSCOPY  07/11/2012   Procedure: URETEROSCOPY;  Surgeon: Alexis Frock, MD;  Location: [facility];  Service: Urology;  Laterality: Left;  Marland Kitchen VAGINAL HYSTERECTOMY  1970   Social History:   reports that she has never smoked. She has never used smokeless tobacco. She reports that she does not drink alcohol or use drugs.  Family History  Problem Relation Age of Onset  . Other      Pt unaware due to psych condition  . Heart attack Father     Medications:   Medication List       Accurate as of 03/23/16  1:50 PM. Always use your most recent med list.          acetaminophen 325 MG tablet Commonly known as:  TYLENOL Take 650 mg by mouth every 6 (six) hours as needed for mild pain.   benztropine 0.5 MG tablet Commonly known as:  COGENTIN Take 1 tablet (0.5 mg total) by mouth 2 (two) times daily.   DELSYM 30 MG/5ML liquid Generic drug:  dextromethorphan Take 60 mg by mouth every 12 (twelve) hours as needed for cough.   diltiazem 180 MG  24 hr capsule Commonly known as:  DILACOR XR Take 180 mg by mouth daily.   furosemide 40 MG tablet Commonly known as:  LASIX Take 40 mg by mouth daily.   ibuprofen 600 MG tablet Commonly known as:  ADVIL,MOTRIN Take 1 tablet (600 mg total) by mouth every 8 (eight) hours as needed for mild pain or moderate pain.   MULTIVITAMIN PO Take 1 tablet by mouth daily.   polyethylene glycol packet Commonly known as:  MIRALAX / GLYCOLAX Take 17 g by mouth daily.   ranitidine 300 MG tablet Commonly known as:  ZANTAC Take 300 mg by mouth at bedtime.   risperiDONE 0.5 MG tablet Commonly known as:  RISPERDAL Take 0.5 mg by mouth every morning.   risperiDONE 1 MG tablet Commonly known as:  RISPERDAL Take 1.5 mg by mouth at bedtime.   rivaroxaban 20 MG Tabs tablet Commonly known as:  XARELTO Take 20 mg by mouth  daily.   sertraline 50 MG tablet Commonly known as:  ZOLOFT Take 50 mg by mouth daily.   Vitamin D3 2000 units capsule Take 2,000 Units by mouth daily.       Immunizations: Immunization History  Administered Date(s) Administered  . PPD Test 01/07/2015, 03/22/2016     Physical Exam:  Vitals:   03/23/16 1345  BP: (!) 125/56  Pulse: 70  Resp: 16  Temp: 97.5 F (36.4 C)  TempSrc: Oral  SpO2: 97%  Weight: 207 lb (93.9 kg)  Height: 5\' 1"  (1.549 m)   Body mass index is 39.11 kg/m.  General- elderly female, obese, in no acute distress Head- normocephalic, atraumatic Nose- no nasal discharge Throat- moist mucus membrane  Eyes- PERRLA, EOMI, no pallor, no icterus Neck- no cervical lymphadenopathy Cardiovascular- normal s1,s2, no murmur, trace leg edema Respiratory- bilateral clear to auscultation, no wheeze, no rhonchi, no crackles, no use of accessory muscles Abdomen- bowel sounds present, soft, non tender Musculoskeletal- able to move all 4 extremities, generalized weakness, on wheelchair Neurological- alert and oriented to person only, laceration to scalp healed well, staple removed Skin- warm and dry Psychiatry- normal mood and affect    Labs reviewed: Basic Metabolic Panel:  Recent Labs  03/17/16 0815 03/18/16 0521  NA 140 139  K 4.1 3.5  CL 109 104  CO2 25 28  GLUCOSE 110* 101*  BUN 19 16  CREATININE 0.95 0.95  CALCIUM 9.5 9.2   Liver Function Tests:  Recent Labs  03/17/16 0815  AST 16  ALT 12*  ALKPHOS 69  BILITOT 0.6  PROT 5.9*  ALBUMIN 3.4*   No results for input(s): LIPASE, AMYLASE in the last 8760 hours. No results for input(s): AMMONIA in the last 8760 hours. CBC:  Recent Labs  03/17/16 0815 03/18/16 0521 03/18/16 1229  WBC 7.7 7.5  --   NEUTROABS 5.4  --   --   HGB 13.6 14.4  --   HCT 42.7 45.7 41.1  MCV 99.3 99.3  --   PLT 174 196  --    Cardiac Enzymes:  Recent Labs  03/18/16 2037 03/19/16 0231 03/19/16 1128    TROPONINI 0.16* 0.19* 0.14*   BNP: Invalid input(s): POCBNP CBG: No results for input(s): GLUCAP in the last 8760 hours.  Radiological Exams: Dg Chest 2 View  Result Date: 03/19/2016 CLINICAL DATA:  Chest pain EXAM: CHEST  2 VIEW COMPARISON:  10/09/2014 FINDINGS: Cardiac enlargement. Negative for heart failure or edema. No pleural effusion. Negative for mass or pneumonia. Motion degrades the lateral  view. Right medial apex density most compatible with vascular ectasia as noted on prior chest CT IMPRESSION: No active cardiopulmonary disease. Electronically Signed   By: Franchot Gallo M.D.   On: 03/19/2016 15:50   Dg Shoulder Right  Result Date: 03/17/2016 CLINICAL DATA:  Pain following recent fall EXAM: RIGHT SHOULDER - 2+ VIEW COMPARISON:  None. FINDINGS: Frontal and Y scapular images were obtained. There is no demonstrable fracture or dislocation. There is moderate generalized osteoarthritic change. No erosive change. Visualized right lung is clear. IMPRESSION: Moderate generalized osteoarthritic change. No fracture or dislocation. Electronically Signed   By: Lowella Grip III M.D.   On: 03/17/2016 10:01   Dg Tibia/fibula Right  Result Date: 03/20/2016 CLINICAL DATA:  Tenderness.  No known injury. EXAM: RIGHT TIBIA AND FIBULA - 2 VIEW COMPARISON:  01/05/2015 FINDINGS: Stable appearance of total right knee arthroplasty. No complicating features identified. The knee is located. No evidence of joint effusion. The tibia and fibula are intact. No acute or healing fracture or focal bony abnormality. Diffuse osteopenia is noted, unchanged. IMPRESSION: Total right knee arthroplasty without complicating feature. No acute bony abnormality. Chronic diffuse osteopenia. Electronically Signed   By: Curlene Dolphin M.D.   On: 03/20/2016 10:19   Ct Head Wo Contrast  Result Date: 03/17/2016 CLINICAL DATA:  Fall with posterior scalp laceration. History of dementia. Initial encounter. EXAM: CT HEAD WITHOUT  CONTRAST CT CERVICAL SPINE WITHOUT CONTRAST TECHNIQUE: Multidetector CT imaging of the head and cervical spine was performed following the standard protocol without intravenous contrast. Multiplanar CT image reconstructions of the cervical spine were also generated. COMPARISON:  CT of the head on 01/05/2015 FINDINGS: CT HEAD FINDINGS Brain: No evidence of acute infarction, hemorrhage, hydrocephalus, extra-axial collection or mass lesion/mass effect. Vascular: No hyperdense vessel or unexpected calcification. Skull: Normal. Negative for fracture or focal lesion. Sinuses/Orbits: No acute finding. Other: Scalp soft tissue swelling over the right lateral vertex. CT CERVICAL SPINE FINDINGS Alignment: Normal. Skull base and vertebrae: No acute fracture. No primary bone lesion or focal pathologic process. Soft tissues and spinal canal: No prevertebral fluid or swelling. No visible canal hematoma. Visualized airway is normally patent. No incidental masses identified. Disc levels: Moderate disc space narrowing and proliferative disease at C5-6. Milder spondylosis at C6-7 with suggestion of minimal anterolisthesis of C6 on C7. Upper chest: Negative. IMPRESSION: 1. Right lateral vertex scalp soft tissue swelling without evidence of acute brain injury or skull fracture. 2. No evidence of acute cervical injury. Moderate cervical spondylosis at C5-6 and milder spondylosis at C6-7. Electronically Signed   By: Aletta Edouard M.D.   On: 03/17/2016 10:13   Ct Cervical Spine Wo Contrast  Result Date: 03/17/2016 CLINICAL DATA:  Fall with posterior scalp laceration. History of dementia. Initial encounter. EXAM: CT HEAD WITHOUT CONTRAST CT CERVICAL SPINE WITHOUT CONTRAST TECHNIQUE: Multidetector CT imaging of the head and cervical spine was performed following the standard protocol without intravenous contrast. Multiplanar CT image reconstructions of the cervical spine were also generated. COMPARISON:  CT of the head on 01/05/2015  FINDINGS: CT HEAD FINDINGS Brain: No evidence of acute infarction, hemorrhage, hydrocephalus, extra-axial collection or mass lesion/mass effect. Vascular: No hyperdense vessel or unexpected calcification. Skull: Normal. Negative for fracture or focal lesion. Sinuses/Orbits: No acute finding. Other: Scalp soft tissue swelling over the right lateral vertex. CT CERVICAL SPINE FINDINGS Alignment: Normal. Skull base and vertebrae: No acute fracture. No primary bone lesion or focal pathologic process. Soft tissues and spinal canal: No prevertebral fluid or  swelling. No visible canal hematoma. Visualized airway is normally patent. No incidental masses identified. Disc levels: Moderate disc space narrowing and proliferative disease at C5-6. Milder spondylosis at C6-7 with suggestion of minimal anterolisthesis of C6 on C7. Upper chest: Negative. IMPRESSION: 1. Right lateral vertex scalp soft tissue swelling without evidence of acute brain injury or skull fracture. 2. No evidence of acute cervical injury. Moderate cervical spondylosis at C5-6 and milder spondylosis at C6-7. Electronically Signed   By: Aletta Edouard M.D.   On: 03/17/2016 10:13   Dg Knee Complete 4 Views Left  Result Date: 03/17/2016 CLINICAL DATA:  Pain following fall EXAM: LEFT KNEE - COMPLETE 4+ VIEW COMPARISON:  None. FINDINGS: Frontal, lateral, and bilateral oblique views were obtained. Patient is status post total knee arthroplasty with femoral and tibial prosthetic components appearing well-seated. No acute fracture or dislocation. No knee joint effusion. No erosive change. IMPRESSION: Prosthetic components appear well seated. No acute fracture or dislocation. No erosive change or joint effusion. Electronically Signed   By: Lowella Grip III M.D.   On: 03/17/2016 10:02   Dg Hips Bilat W Or Wo Pelvis 3-4 Views  Result Date: 03/17/2016 CLINICAL DATA:  Pain following fall EXAM: DG HIP (WITH OR WITHOUT PELVIS) 3-4V BILAT COMPARISON:  None.  FINDINGS: Frontal pelvis as well as frontal and lateral hip views bilaterally -total five views -obtained. No evident fracture or dislocation. There is mild symmetric narrowing of both hip joints. No erosive change. There is mild osteoarthritic change in each sacroiliac joint. Bones appear somewhat osteoporotic. IMPRESSION: Mild narrowing of both hip joints. Mild osteoarthritic change in each sacroiliac joint. No fracture dislocation. Bones appear somewhat osteoporotic. Electronically Signed   By: Lowella Grip III M.D.   On: 03/17/2016 10:03    Assessment/Plan  Physical deconditioning Will have her work with physical therapy and occupational therapy team to help with gait training and muscle strengthening exercises.fall precautions. Skin care. Encourage to be out of bed.   UTI  Asymptomatic, completed her course of ciprofloxacin, hydration encouraged  Scalp laceration Has healed, staple removed today, fall precautions  Depression Seen by pscyh service and her zoloft has been increased to 50 mg daily from 25 mg, monitor mood  acute costochondritis Denies any chest pain this visit. No reproducible chest pain. Continue prn ibuprofen for now and monitor  Chronic atrial fibrillation continue diltiazem 180 mg daily and xarelto for anticoagulation  CHF Stable breathing. Continue lasix 40 mg daily. Monitor bmp  gerd Stable, continue her ranitidine  Dementia with behavioral disturbance Continue risperidone and zoloft. Continue supportive care    Goals of care: short term rehabilitation   Labs/tests ordered: cbc, bmp  Family/ staff Communication: reviewed care plan with patient and nursing supervisor    Blanchie Serve, MD Internal Medicine [place], [place] [address] Cell Phone (Monday-Friday 8 am - 5 pm): [phone] On Call: [phone] and follow prompts after 5 pm and on weekends Office Phone:  [phone] Office Fax: [phone]

## 2016-03-24 ENCOUNTER — Emergency Department (HOSPITAL_COMMUNITY)
Admission: EM | Admit: 2016-03-24 | Discharge: 2016-03-26 | Disposition: A | Discharge status: Home / Self Care | Payer: Medicare Other | Attending: Emergency Medicine | Admitting: Emergency Medicine

## 2016-03-24 ENCOUNTER — Encounter (HOSPITAL_COMMUNITY): Payer: Self-pay | Admitting: Emergency Medicine

## 2016-03-24 ENCOUNTER — Emergency Department (HOSPITAL_COMMUNITY): Payer: Medicare Other

## 2016-03-24 DIAGNOSIS — Y929 Unspecified place or not applicable: Secondary | ICD-10-CM | POA: Diagnosis not present

## 2016-03-24 DIAGNOSIS — Y939 Activity, unspecified: Secondary | ICD-10-CM | POA: Diagnosis not present

## 2016-03-24 DIAGNOSIS — W228XXA Striking against or struck by other objects, initial encounter: Secondary | ICD-10-CM | POA: Diagnosis not present

## 2016-03-24 DIAGNOSIS — Z23 Encounter for immunization: Secondary | ICD-10-CM | POA: Diagnosis not present

## 2016-03-24 DIAGNOSIS — S0101XA Laceration without foreign body of scalp, initial encounter: Secondary | ICD-10-CM | POA: Insufficient documentation

## 2016-03-24 DIAGNOSIS — Z96653 Presence of artificial knee joint, bilateral: Secondary | ICD-10-CM | POA: Diagnosis not present

## 2016-03-24 DIAGNOSIS — Y999 Unspecified external cause status: Secondary | ICD-10-CM | POA: Diagnosis not present

## 2016-03-24 DIAGNOSIS — S0990XA Unspecified injury of head, initial encounter: Secondary | ICD-10-CM | POA: Diagnosis present

## 2016-03-24 LAB — CBC WITH DIFFERENTIAL/PLATELET
Basophils Absolute: 0 10*3/uL (ref 0.0–0.1)
Basophils Relative: 0 %
EOS ABS: 0 10*3/uL (ref 0.0–0.7)
EOS PCT: 0 %
HCT: 44.1 % (ref 36.0–46.0)
HEMOGLOBIN: 14.4 g/dL (ref 12.0–15.0)
LYMPHS PCT: 21 %
Lymphs Abs: 1.9 10*3/uL (ref 0.7–4.0)
MCH: 31.7 pg (ref 26.0–34.0)
MCHC: 32.7 g/dL (ref 30.0–36.0)
MCV: 97.1 fL (ref 78.0–100.0)
MONOS PCT: 7 %
Monocytes Absolute: 0.6 10*3/uL (ref 0.1–1.0)
NEUTROS ABS: 6.7 10*3/uL (ref 1.7–7.7)
NEUTROS PCT: 72 %
PLATELETS: 197 10*3/uL (ref 150–400)
RBC: 4.54 MIL/uL (ref 3.87–5.11)
RDW: 13.7 % (ref 11.5–15.5)
WBC: 9.3 10*3/uL (ref 4.0–10.5)

## 2016-03-24 LAB — COMPREHENSIVE METABOLIC PANEL
ALK PHOS: 82 U/L (ref 38–126)
ALT: 24 U/L (ref 14–54)
ANION GAP: 10 (ref 5–15)
AST: 21 U/L (ref 15–41)
Albumin: 3.6 g/dL (ref 3.5–5.0)
BUN: 18 mg/dL (ref 6–20)
CALCIUM: 9.5 mg/dL (ref 8.9–10.3)
CO2: 25 mmol/L (ref 22–32)
CREATININE: 0.8 mg/dL (ref 0.44–1.00)
Chloride: 105 mmol/L (ref 101–111)
Glucose, Bld: 111 mg/dL — ABNORMAL HIGH (ref 65–99)
Potassium: 3.9 mmol/L (ref 3.5–5.1)
Sodium: 140 mmol/L (ref 135–145)
TOTAL PROTEIN: 6.1 g/dL — AB (ref 6.5–8.1)
Total Bilirubin: 0.5 mg/dL (ref 0.3–1.2)

## 2016-03-24 LAB — URINALYSIS, ROUTINE W REFLEX MICROSCOPIC
BILIRUBIN URINE: NEGATIVE
Glucose, UA: NEGATIVE mg/dL
Hgb urine dipstick: NEGATIVE
KETONES UR: NEGATIVE mg/dL
LEUKOCYTES UA: NEGATIVE
NITRITE: NEGATIVE
PROTEIN: NEGATIVE mg/dL
Specific Gravity, Urine: 1.007 (ref 1.005–1.030)
pH: 5.5 (ref 5.0–8.0)

## 2016-03-24 MED ORDER — TETANUS-DIPHTH-ACELL PERTUSSIS 5-2.5-18.5 LF-MCG/0.5 IM SUSP
0.5000 mL | Freq: Once | INTRAMUSCULAR | Status: AC
  Administered 2016-03-24: 0.5 mL via INTRAMUSCULAR
  Filled 2016-03-24: qty 0.5

## 2016-03-24 NOTE — Discharge Instructions (Addendum)
Staple removal in a week.   Fall precautions at [facility] place.  See your doctor  Return to ER if you have bleeding from the wound, fever, purulent drainage, vomiting.

## 2016-03-24 NOTE — ED Notes (Signed)
Pt confused. Unable to sign for discharge.

## 2016-03-24 NOTE — ED Provider Notes (Signed)
  Physical Exam  BP 98/73 (BP Location: Right Arm)   Pulse 80   Temp 98.4 F (36.9 C) (Oral)   Resp 16   Ht 5\' 1"  (1.549 m)   Wt 207 lb (93.9 kg)   SpO2 97%   BMI 39.11 kg/m   Physical Exam  ED Course  Procedures  MDM Care assumed at sign out from Dr. Lita Strong at 4pm. Patient from [facility] on xarelto with head injury, no syncope. CT head unremarkable. UA pending and was negative. Just finished course of abx for UTI. There was some bleeding from the scalp laceration after she removed dressing. Dr. Lita Strong placed 2 staples, the wound is still open I put 4 more staples and use dermabond as she said that she won't keep dressing on anyway. Stable for dc back to facility.   LACERATION REPAIR Performed by: Wandra Arthurs Authorized by: Wandra Arthurs Consent: Verbal consent obtained. Risks and benefits: risks, benefits and alternatives were discussed Consent given by: patient Patient identity confirmed: provided demographic data Prepped and Draped in normal sterile fashion Wound explored  Laceration Location: posterior scalp  Laceration Length: 5 cm  No Foreign Bodies seen or palpated  Anesthesia: none  Irrigation method: syringe Amount of cleaning: standard  Skin closure: staples   Number of staples: 5  Technique: staples and dermabond   Patient tolerance: Patient tolerated the procedure well with no immediate complications.    Monica Freeze, MD 03/24/16 [phone]

## 2016-03-24 NOTE — ED Notes (Signed)
Transported to CT 

## 2016-03-24 NOTE — ED Notes (Signed)
[facility] contacted to transport patient to [facility]

## 2016-03-24 NOTE — ED Triage Notes (Signed)
Per EMS, pt is from [facility] and [facility]. Pt had an unwitnessed fall but thinks she slipped from side of bed and hit head on something. She had a prior laceration one week ago, sutures were removed yesterday and laceration has reopened due to fall from bed.  Denies loss of consciousness or syncope.  C/o of headache. Patient is on Xarelto.  Initial BP 152/82, 80s HR, and final BP 130/76, 82 HR, 16 RR, 98% Room air.

## 2016-03-24 NOTE — ED Provider Notes (Signed)
[facility] DEPT Provider Note   CSN: [id number] Arrival date & time: 03/24/16  1254     History   Chief Complaint Chief Complaint  Patient presents with  . Fall    HPI Kortne Zoda Mirra is a 82 y.o. female.  HPI Patient presents after sliding out of bed. States she struck her head but does not know on what. No loss of consciousness. Sustained a laceration to the left occipital scalp. She was seen roughly one week ago for similar. He was treated for UTI with antibiotics. Patient denies any focal weakness or numbness. No visual changes. No nausea or vomiting. Denies fever or chills. Past Medical History:  Diagnosis Date  . Arthritis    "hands" (01/05/2015)  . Atrial fibrillation ([place])   . Chest pain    a. Normal stress test 08/2008 and CTA neg for PE at that time.  . Dysuria    has urethral bump  . GERD (gastroesophageal reflux disease)   . H/O hiatal hernia   . Heart murmur MILD -- ASYMPTOMATIC  . History of infection due to ESBL Escherichia coli 02/23/2014  . Hypertension   . Inner ear dysfunction    a. Prior h/o vertigo.  Marland Kitchen Neuromuscular disorder (HCC)    rt hand numbness  . Obesity   . OSA (obstructive sleep apnea)    not using cpap (01/05/2015)  . Paranoia ([place])   . Poor short term memory   . Psychotic episode    "was hospitalized at [facility] 07/2014; was having hallucinations; on RX now" (01/05/2015)  . Pulmonary embolism ([place]) ~ 05/2014  . Pulmonary nodules BENIGN--  MONITORED BY PCP DR READE--  ASYMPTOMATIC    LAST CHEST CT 08-17-2011  . Renal stones left  . Sinus drainage   . Ureteral stent retained   . Urge urinary incontinence     Patient Active Problem List   Diagnosis Date Noted  . Chest pain   . UTI (urinary tract infection) 03/17/2016  . Laceration of head 03/17/2016  . Chronic venous stasis dermatitis 03/17/2016  . Nodule of right lung 01/17/2015  . Elevated troponin 01/05/2015  . Abdominal pain 01/05/2015  . Knee pain, right 01/05/2015  .  Dementia 01/05/2015  . Fall 01/05/2015  . Hilar adenopathy 05/15/2014  . Faintness   . PE (pulmonary embolism)   . Thrombocytopenia ([place]) 04/15/2014  . LOC (loss of consciousness) ([place]) 04/15/2014  . Transient loss of consciousness 04/05/2014  . Syncope 04/05/2014  . History of infection due to ESBL Escherichia coli 02/23/2014  . Demand ischemia ([place]) 02/23/2014  . Psychosis 02/22/2014  . Atrial fibrillation ([place]) 02/17/2014  . Altered mental state 02/17/2014  . Paranoia ([place]) 02/17/2014  . Troponin level elevated 02/17/2014  . Unspecified vitamin D deficiency 02/19/2013  . Osteopenia 02/19/2013  . Morbid obesity ([place]) 06/14/2010  . Obstructive sleep apnea 06/14/2010  . GERD 06/14/2010  . DIVERTICULOSIS OF COLON 06/14/2010  . RECTAL BLEEDING 06/14/2010  . COLONIC POLYPS, HX OF 06/14/2010    Past Surgical History:  Procedure Laterality Date  . APPENDECTOMY     PT STATES PER XRAY SMALL AMOUNT OF APPENDIX LEFT  . BLEPHAROPLASTY Bilateral   . CARDIOVASCULAR STRESS TEST  03-13-2007  dr Tressia Miners turner   NORMAL LV SIZE SYSTOLIC FUCTION/ NO ISCHEMIA/ EF 85%  . CATARACT EXTRACTION W/ INTRAOCULAR LENS  IMPLANT, BILATERAL    . CYSTOSCOPY W/ RETROGRADES  07/11/2012   Procedure: CYSTOSCOPY WITH RETROGRADE PYELOGRAM;  Surgeon: Alexis Frock, MD;  Location: [facility];  Service: Urology;  Laterality: Left;  left third stage ureteroscopystone manipulation  . CYSTOSCOPY W/ URETERAL STENT PLACEMENT  06/13/2012   Procedure: CYSTOSCOPY WITH STENT REPLACEMENT;  Surgeon: Alexis Frock, MD;  Location[facility];  Service: Urology;  Laterality: Left;  . CYSTOSCOPY W/ URETERAL STENT REMOVAL  07/11/2012   Procedure: CYSTOSCOPY WITH STENT REMOVAL;  Surgeon: Alexis Frock, MD;  Location[facility];  Service: Urology;  Laterality: Left;  . CYSTOSCOPY WITH RETROGRADE PYELOGRAM, URETEROSCOPY AND STENT PLACEMENT  05/14/2012   Procedure: CYSTOSCOPY WITH  RETROGRADE PYELOGRAM, URETEROSCOPY AND STENT PLACEMENT;  Surgeon: Alexis Frock, MD;  Location: [facility];  Service: Urology;  Laterality: Left;  1ST STAGE LEFT URETEROSCOPY, LEFT RETROGRADE, DIGITAL URETEROSCOPY,  LEFT STONE REMOVAL WITH ESCAPE BASKET,  STENT PLACEMENt   . CYSTOSCOPY/RETROGRADE/URETEROSCOPY/STONE EXTRACTION WITH BASKET  06/13/2012   Procedure: CYSTOSCOPY/RETROGRADE/URETEROSCOPY/STONE EXTRACTION WITH BASKET;  Surgeon: Alexis Frock, MD;  Location: [facility];  Service: Urology;  Laterality: Left;  . HOLMIUM LASER APPLICATION  [id number]   Procedure: HOLMIUM LASER APPLICATION;  Surgeon: Alexis Frock, MD;  Location: [facility];  Service: Urology;  Laterality: Left;  . HOLMIUM LASER APPLICATION  [id number]   Procedure: HOLMIUM LASER APPLICATION;  Surgeon: Alexis Frock, MD;  Location: [facility];  Service: Urology;  Laterality: Left;  . JOINT REPLACEMENT    . REMOVAL BREAST CYST, BENIGN  1960's  . TONSILLECTOMY    . TOTAL KNEE ARTHROPLASTY Left 10-17-2009  DR Wynelle Link  . TOTAL KNEE ARTHROPLASTY Right 04-25-2009  . TRANSTHORACIC ECHOCARDIOGRAM  08-18-2008   NORMAL LVSF/ EF 55-60%/ MILD MITRAL REGURG .  Marland Kitchen URETEROSCOPY  07/11/2012   Procedure: URETEROSCOPY;  Surgeon: Alexis Frock, MD;  Location: [facility];  Service: Urology;  Laterality: Left;  Marland Kitchen VAGINAL HYSTERECTOMY  1970    OB History    No data available       Home Medications    Prior to Admission medications   Medication Sig Start Date End Date Taking? Authorizing Provider  acetaminophen (TYLENOL) 325 MG tablet Take 650 mg by mouth every 6 (six) hours as needed for mild pain.   Yes Historical Provider, MD  benztropine (COGENTIN) 0.5 MG tablet Take 1 tablet (0.5 mg total) by mouth 2 (two) times daily. 01/07/15  Yes Shanker Kristeen Mans, MD  Cholecalciferol (VITAMIN D3) 2000 UNITS capsule Take 2,000 Units by mouth daily.   Yes Historical  Provider, MD  dextromethorphan (DELSYM) 30 MG/5ML liquid Take 60 mg by mouth every 12 (twelve) hours as needed for cough.   Yes Historical Provider, MD  diltiazem (DILACOR XR) 180 MG 24 hr capsule Take 180 mg by mouth daily.   Yes Historical Provider, MD  furosemide (LASIX) 40 MG tablet Take 40 mg by mouth daily.    Yes Historical Provider, MD  ibuprofen (ADVIL,MOTRIN) 600 MG tablet Take 1 tablet (600 mg total) by mouth every 8 (eight) hours as needed for mild pain or moderate pain. 03/20/16  Yes Mariel Aloe, MD  Multiple Vitamins-Minerals (MULTIVITAMIN PO) Take 1 tablet by mouth daily.   Yes Historical Provider, MD  polyethylene glycol (MIRALAX / GLYCOLAX) packet Take 17 g by mouth daily. 04/20/14  Yes Oswald Hillock, MD  ranitidine (ZANTAC) 300 MG tablet Take 300 mg by mouth at bedtime.    Yes Historical Provider, MD  risperiDONE (RISPERDAL) 0.5 MG tablet Take 0.5 mg by mouth every morning.    Yes Historical Provider, MD  risperiDONE (RISPERDAL) 1 MG tablet Take 1.5 mg by mouth at bedtime.   Yes Historical Provider, MD  rivaroxaban (XARELTO) 20 MG TABS tablet Take 20 mg by mouth daily.   Yes Historical Provider, MD  sertraline (ZOLOFT) 50 MG tablet Take 50 mg by mouth daily.   Yes Historical Provider, MD    Family History Family History  Problem Relation Age of Onset  . Other      Pt unaware due to psych condition  . Heart attack Father     Social History Social History  Substance Use Topics  . Smoking status: Never Smoker  . Smokeless tobacco: Never Used  . Alcohol use No     Allergies   Diphenhydramine; Flagyl [metronidazole]; Hydrocodone; Meperidine hcl; Penicillins; and Sulfonamide derivatives   Review of Systems Review of Systems  Constitutional: Negative for chills and fever.  HENT: Negative for facial swelling.   Eyes: Negative for visual disturbance.  Respiratory: Negative for shortness of breath.   Cardiovascular: Negative for chest pain.  Gastrointestinal:  Negative for abdominal pain, diarrhea, nausea and vomiting.  Genitourinary: Negative for dysuria, flank pain, hematuria and urgency.  Musculoskeletal: Negative for back pain, myalgias, neck pain and neck stiffness.  Skin: Positive for wound. Negative for rash.  Neurological: Negative for dizziness, weakness, light-headedness, numbness and headaches.  All other systems reviewed and are negative.    Physical Exam Updated Vital Signs BP 98/73 (BP Location: Right Arm)   Pulse 80   Temp 98.4 F (36.9 C) (Oral)   Resp 16   Ht 5\' 1"  (1.549 m)   Wt 207 lb (93.9 kg)   SpO2 97%   BMI 39.11 kg/m   Physical Exam  Constitutional: She is oriented to person, place, and time. She appears well-developed and well-nourished.  HENT:  Head: Normocephalic and atraumatic.  Mouth/Throat: Oropharynx is clear and moist.  Patient with 2 cm laceration to the left posterior scalp. No active bleeding.  Eyes: EOM are normal. Pupils are equal, round, and reactive to light.  Neck: Normal range of motion. Neck supple.  No posterior midline cervical tenderness to palpation.  Cardiovascular: Normal rate and regular rhythm.   Pulmonary/Chest: Effort normal and breath sounds normal.  Abdominal: Soft. Bowel sounds are normal. There is no tenderness. There is no rebound and no guarding.  Musculoskeletal: Normal range of motion. She exhibits no edema or tenderness.  2+ distal pulses in all extremities. No midline thoracic or lumbar tenderness. Pelvis is stable.  Neurological: She is alert and oriented to person, place, and time.  5/5 motor in all extremities. Sensation is fully intact.  Skin: Skin is warm and dry. Capillary refill takes less than 2 seconds. No rash noted. No erythema.  Psychiatric: She has a normal mood and affect. Her behavior is normal.  Nursing note and vitals reviewed.    ED Treatments / Results  Labs (all labs ordered are listed, but only abnormal results are displayed) Labs Reviewed    COMPREHENSIVE METABOLIC PANEL - Abnormal; Notable for the following:       Result Value   Glucose, Bld 111 (*)    Total Protein 6.1 (*)    All other components within normal limits  CBC WITH DIFFERENTIAL/PLATELET  URINALYSIS, ROUTINE W REFLEX MICROSCOPIC (NOT AT [facility])    EKG  EKG Interpretation None       Radiology Ct Head Wo Contrast  Result Date: 03/24/2016 CLINICAL DATA:  Patient fell and hit back of head. EXAM: CT HEAD WITHOUT CONTRAST  CT CERVICAL SPINE WITHOUT CONTRAST TECHNIQUE: Multidetector CT imaging of the head and cervical spine was performed following the standard protocol without intravenous contrast. Multiplanar CT image reconstructions of the cervical spine were also generated. COMPARISON:  March 17, 2016 FINDINGS: CT HEAD FINDINGS Brain: No evidence of acute infarction, hemorrhage, hydrocephalus, extra-axial collection or mass lesion/mass effect. Vascular: Calcified atherosclerosis in the intracranial carotid arteries. Skull: Normal. Negative for fracture or focal lesion. Sinuses/Orbits: No acute finding. Other: There is a laceration over the left posterior scalp. Extracranial soft tissues are otherwise normal. CT CERVICAL SPINE FINDINGS Alignment: Mild anterolisthesis of C4 versus C5 and C6 versus C7, unchanged in the interval. No acute malalignment. Skull base and vertebrae: No acute fracture. No primary bone lesion or focal pathologic process. Soft tissues and spinal canal: No prevertebral fluid or swelling. No visible canal hematoma. Disc levels:  Degenerative changes most marked at C5-6. Upper chest: Not imaged Other: No other abnormalities. IMPRESSION: 1. No acute intracranial abnormality. 2. No fracture or traumatic malalignment in the cervical spine. Electronically Signed   By: Dorise Bullion III M.D   On: 03/24/2016 15:58   Ct Cervical Spine Wo Contrast  Result Date: 03/24/2016 CLINICAL DATA:  Patient fell and hit back of head. EXAM: CT HEAD WITHOUT CONTRAST CT  CERVICAL SPINE WITHOUT CONTRAST TECHNIQUE: Multidetector CT imaging of the head and cervical spine was performed following the standard protocol without intravenous contrast. Multiplanar CT image reconstructions of the cervical spine were also generated. COMPARISON:  March 17, 2016 FINDINGS: CT HEAD FINDINGS Brain: No evidence of acute infarction, hemorrhage, hydrocephalus, extra-axial collection or mass lesion/mass effect. Vascular: Calcified atherosclerosis in the intracranial carotid arteries. Skull: Normal. Negative for fracture or focal lesion. Sinuses/Orbits: No acute finding. Other: There is a laceration over the left posterior scalp. Extracranial soft tissues are otherwise normal. CT CERVICAL SPINE FINDINGS Alignment: Mild anterolisthesis of C4 versus C5 and C6 versus C7, unchanged in the interval. No acute malalignment. Skull base and vertebrae: No acute fracture. No primary bone lesion or focal pathologic process. Soft tissues and spinal canal: No prevertebral fluid or swelling. No visible canal hematoma. Disc levels:  Degenerative changes most marked at C5-6. Upper chest: Not imaged Other: No other abnormalities. IMPRESSION: 1. No acute intracranial abnormality. 2. No fracture or traumatic malalignment in the cervical spine. Electronically Signed   By: Dorise Bullion III M.D   On: 03/24/2016 15:58    Procedures .Marland KitchenLaceration Repair Date/Time: 03/24/2016 4:10 PM Performed by: Julianne Rice Authorized by: Lita Mains, Ed Rayson   Anesthesia (see MAR for exact dosages):    Anesthesia method:  None Laceration details:    Location:  Scalp   Scalp location:  Occipital   Length (cm):  2 Repair type:    Repair type:  Simple Treatment:    Area cleansed with:  Saline   Amount of cleaning:  Extensive   Irrigation solution:  Sterile saline Skin repair:    Repair method:  Staples   Number of staples:  2 Approximation:    Approximation:  Close   Vermilion border: well-aligned   Post-procedure  details:    Dressing:  Adhesive bandage   Patient tolerance of procedure:  Tolerated well, no immediate complications   (including critical care time)  Medications Ordered in ED Medications  Tdap (BOOSTRIX) injection 0.5 mL (0.5 mLs Intramuscular Given 03/24/16 1617)     Initial Impression / Assessment and Plan / ED Course  I have reviewed the triage vital signs and the nursing notes.  Pertinent labs & imaging results that were available during my care of the patient were reviewed by me and considered in my medical decision making (see chart for details).  Clinical Course   Will check basic labs and recheck urine to assure resolution of UTI. Also get CT head to rule out intracranial bleeding. Will repair wound to the emergency department. Patient not understanding the need to follow-up with their primary physician in 7 days to have staples removed. Head injury precautions given. Final Clinical Impressions(s) / ED Diagnoses   Final diagnoses:  Laceration of scalp, initial encounter    New Prescriptions New Prescriptions   No medications on file     Julianne Rice, MD 03/25/16 [phone]

## 2016-04-19 ENCOUNTER — Encounter: Payer: Self-pay | Admitting: Adult Health

## 2016-04-19 ENCOUNTER — Non-Acute Institutional Stay (SKILLED_NURSING_FACILITY): Payer: Medicare Other | Admitting: Adult Health

## 2016-04-19 DIAGNOSIS — F29 Unspecified psychosis not due to a substance or known physiological condition: Secondary | ICD-10-CM | POA: Diagnosis not present

## 2016-04-19 DIAGNOSIS — I482 Chronic atrial fibrillation, unspecified: Secondary | ICD-10-CM

## 2016-04-19 DIAGNOSIS — K5901 Slow transit constipation: Secondary | ICD-10-CM | POA: Diagnosis not present

## 2016-04-19 DIAGNOSIS — K219 Gastro-esophageal reflux disease without esophagitis: Secondary | ICD-10-CM | POA: Diagnosis not present

## 2016-04-19 DIAGNOSIS — F0391 Unspecified dementia with behavioral disturbance: Secondary | ICD-10-CM

## 2016-04-19 DIAGNOSIS — G259 Extrapyramidal and movement disorder, unspecified: Secondary | ICD-10-CM | POA: Diagnosis not present

## 2016-04-19 DIAGNOSIS — F32A Depression, unspecified: Secondary | ICD-10-CM

## 2016-04-19 DIAGNOSIS — F329 Major depressive disorder, single episode, unspecified: Secondary | ICD-10-CM | POA: Diagnosis not present

## 2016-04-19 DIAGNOSIS — I5032 Chronic diastolic (congestive) heart failure: Secondary | ICD-10-CM

## 2016-04-19 NOTE — Progress Notes (Signed)
Patient ID: Monica Strong, female   DOB: 1931/11/28, 82 y.o.   MRN: [id number]    DATE:  04/19/16  MRN:  [id number]  BIRTHDAY: 18-Jun-1931  Facility:  Nursing Home Location:  [place] [facility] [place] Room Number: 804-B  LEVEL OF CARE:  SNF [phone])  Contact Information    Name Relation Home Work Mobile   McSwain,[name] Daughter [phone]   McSwain,Tim Other [phone]       Code Status History    Date Active Date Inactive Code Status Order ID Comments User Context   03/17/2016  3:07 PM 03/20/2016  9:12 PM Full Code [id number]  Maren Reamer, MD ED   01/05/2015  7:01 PM 01/07/2015  6:31 PM DNR [id number]  Melton Alar, PA-C Inpatient   04/15/2014  4:03 PM 04/20/2014  8:40 PM Full Code [id number]  Oswald Hillock, MD Inpatient   04/05/2014  5:25 PM 04/06/2014  7:23 PM Full Code [id number]  Delfina Redwood, MD Inpatient   02/17/2014  2:07 AM 02/23/2014  9:31 PM Full Code [id number]  Allyne Gee, MD Inpatient       Chief Complaint  Patient presents with  . Medical Management of Chronic Issues    HISTORY OF PRESENT ILLNESS:  This is an 82 year old female who is being seen for a routine visit. She is currently having Speech Therapy treatments. PT and OT were recently discontinued.  She has PMH of dementia, psychosis and untreated sleep apnea. She has been admitted to [facility] on 03/20/16 from [facility] . She lives in an ALF wherein she was found down. She sustained a head laceration. She was diagnosed to have UTI and empirically treated with Ciprofloxacin and was switched to Meropenem. She was switched back to Ciprofloxacin PO with urine culture showing pan-sensitive. She complained of chest pain with EKG showing atrial fibrillation. It was thought to be from costochondritis. Staple was placed on her head laceration.  She was seen in the room while sitting on her wheelchair. She did not voice any  concerns.   PAST MEDICAL HISTORY:  Past Medical History:  Diagnosis Date  . Arthritis    "hands" (01/05/2015)  . Atrial fibrillation ([place])   . Chest pain    a. Normal stress test 08/2008 and CTA neg for PE at that time.  . Dysuria    has urethral bump  . GERD (gastroesophageal reflux disease)   . H/O hiatal hernia   . Heart murmur MILD -- ASYMPTOMATIC  . History of infection due to ESBL Escherichia coli 02/23/2014  . Hypertension   . Inner ear dysfunction    a. Prior h/o vertigo.  Marland Kitchen Neuromuscular disorder (HCC)    rt hand numbness  . Obesity   . OSA (obstructive sleep apnea)    not using cpap (01/05/2015)  . Paranoia ([place])   . Poor short term memory   . Psychotic episode    "was hospitalized at [facility] 07/2014; was having hallucinations; on RX now" (01/05/2015)  . Pulmonary embolism ([place]) ~ 05/2014  . Pulmonary nodules BENIGN--  MONITORED BY PCP DR READE--  ASYMPTOMATIC    LAST CHEST CT 08-17-2011  . Renal stones left  . Sinus drainage   . Ureteral stent retained   . Urge urinary incontinence      CURRENT MEDICATIONS: Reviewed  Patient's Medications  New Prescriptions   FUROSEMIDE (LASIX) 20 MG TABLET    Take 1 tablet (20 mg total) by  mouth daily.  Previous Medications   ACETAMINOPHEN (TYLENOL) 325 MG TABLET    Take 650 mg by mouth every 6 (six) hours as needed for mild pain.   ASPIRIN EC 81 MG TABLET    Take 1 tablet (81 mg total) by mouth daily.   BENZTROPINE (COGENTIN) 0.5 MG TABLET    Take 1 tablet (0.5 mg total) by mouth 2 (two) times daily.   CHOLECALCIFEROL (VITAMIN D3) 2000 UNITS CAPSULE    Take 2,000 Units by mouth daily.   DEXTROMETHORPHAN (DELSYM) 30 MG/5ML LIQUID    Take 60 mg by mouth every 12 (twelve) hours as needed for cough.   DILTIAZEM (DILACOR XR) 180 MG 24 HR CAPSULE    Take 180 mg by mouth daily.    MULTIPLE VITAMINS-MINERALS (MULTIVITAMIN PO)    Take 1 tablet by mouth daily.   POLYETHYLENE GLYCOL (MIRALAX / GLYCOLAX) PACKET    Take 17 g by mouth  daily.   RANITIDINE (ZANTAC) 300 MG TABLET    Take 300 mg by mouth at bedtime.    RISPERIDONE (RISPERDAL) 0.5 MG TABLET    Take 0.5 mg by mouth every morning.    RISPERIDONE (RISPERDAL) 1 MG TABLET    Take 1.5 mg by mouth at bedtime. Take 1.5 tablets to = 1.5 mg qhs    SERTRALINE (ZOLOFT) 50 MG TABLET    Take 50 mg by mouth daily.  Modified Medications   No medications on file  Discontinued Medications   FUROSEMIDE (LASIX) 40 MG TABLET    Take 40 mg by mouth daily.    IBUPROFEN (ADVIL,MOTRIN) 600 MG TABLET    Take 1 tablet (600 mg total) by mouth every 8 (eight) hours as needed for mild pain or moderate pain.   RISPERIDONE (RISPERDAL) 1 MG TABLET    Take 1.5 mg by mouth at bedtime.   RIVAROXABAN (XARELTO) 20 MG TABS TABLET    Take 20 mg by mouth daily.     Allergies  Allergen Reactions  . Diphenhydramine Nausea Only    Sick to stomach  . Flagyl [Metronidazole] Other (See Comments)    HALLUCINATIONS  . Hydrocodone Other (See Comments)    HALLUCINATIONS  . Meperidine Hcl Nausea And Vomiting  . Penicillins Hives  . Sulfonamide Derivatives Hives     REVIEW OF SYSTEMS:  Unable to obtain due to being sleepy   PHYSICAL EXAMINATION  GENERAL APPEARANCE: Well nourished. In no acute distress. Obese SKIN:  Skin is warm and dry HEAD: Normal in size and contour. No evidence of trauma EYES: Lids open and close normally. No blepharitis, entropion or ectropion. PERRL. Conjunctivae are clear and sclerae are white. Lenses are without opacity EARS: Pinnae are normal. Patient hears normal voice tunes of the examiner MOUTH and THROAT: Lips are without lesions. Oral mucosa is moist and without lesions. Tongue is normal in shape, size, and color and without lesions NECK: supple, trachea midline, no neck masses, no thyroid tenderness, no thyromegaly LYMPHATICS: no LAN in the neck, no supraclavicular LAN RESPIRATORY: breathing is even & unlabored, BS CTAB CARDIAC: irregularly irregular, no murmur,no  extra heart sounds, no edema GI: abdomen soft, normal BS, no masses, no tenderness, no hepatomegaly, no splenomegaly EXTREMITIES:  Able to move X 4 extremities PSYCHIATRIC:  Disoriented to time and oriented to person and place; Affect and mood is appropriate  LABS/RADIOLOGY: Labs reviewed: Basic Metabolic Panel:  Recent Labs  03/18/16 0521 03/22/16 03/24/16 1431   NA 139 148* 140   K 3.5 4.1  3.9   CL 104  --  105   CO2 28  --  25   GLUCOSE 101*  --  111*   BUN 16 25* 18   CREATININE 0.95 0.9 0.80   CALCIUM 9.2  --  9.5    Liver Function Tests:  Recent Labs  03/17/16 0815 03/24/16 1431  AST 16 21  ALT 12* 24  ALKPHOS 69 82  BILITOT 0.6 0.5  PROT 5.9* 6.1*  ALBUMIN 3.4* 3.6   CBC:  Recent Labs  03/17/16 0815 03/18/16 0521 03/18/16 1229 03/22/16 03/24/16 1431  WBC 7.7 7.5  --  6.4 9.3  NEUTROABS 5.4  --   --  4 6.7  HGB 13.6 14.4  --  14.9 14.4  HCT 42.7 45.7 41.1 46 44.1  MCV 99.3 99.3  --   --  97.1  PLT 174 196  --  207 197   Cardiac Enzymes:  Recent Labs  03/18/16 2037 03/19/16 0231 03/19/16 1128  TROPONINI 0.16* 0.19* 0.14*       ASSESSMENT/PLAN:  Chronic atrial fibrillation - rate-controlled; continue Diltiazem 24HR CD 180 mg 1 capsule PO from Q AM and Xarelto 20 mg 1 tab PO Q D  CHF - no SOB; continue Lasix 40 mg 1 tab PO Q D; follow-up with cardiology; check BMP  Dementia with Psychosis -  continue supportive care; fall precaution; continue Rispridone 0.5 mg PO Q AM and 1.5 mg Q HS; psychiatric consult with [facility]; ST treatments for cognitive deficits  EPS - continue Benztropine 0.5 mg 1 tab PO BID  Constipation - continue Miralax 17 gm PO Q D  GERD - continue Zantac 300 mg 1 tab PO Q HS  Depression - continue Zoloft 50 mg 1 tab PO Q D     Goals of care:  Short-term rehabilitation     Durenda Age, NP [place]

## 2016-04-26 ENCOUNTER — Encounter: Payer: Self-pay | Admitting: Internal Medicine

## 2016-04-26 ENCOUNTER — Ambulatory Visit (INDEPENDENT_AMBULATORY_CARE_PROVIDER_SITE_OTHER): Payer: Medicare Other | Admitting: Internal Medicine

## 2016-04-26 VITALS — BP 112/76 | HR 93 | Ht 61.0 in | Wt 212.4 lb

## 2016-04-26 DIAGNOSIS — I482 Chronic atrial fibrillation, unspecified: Secondary | ICD-10-CM

## 2016-04-26 DIAGNOSIS — R079 Chest pain, unspecified: Secondary | ICD-10-CM | POA: Diagnosis not present

## 2016-04-26 DIAGNOSIS — R410 Disorientation, unspecified: Secondary | ICD-10-CM

## 2016-04-26 LAB — BASIC METABOLIC PANEL
BUN: 20 mg/dL (ref 7–25)
CHLORIDE: 106 mmol/L (ref 98–110)
CO2: 29 mmol/L (ref 20–31)
Calcium: 9.4 mg/dL (ref 8.6–10.4)
Creat: 0.92 mg/dL — ABNORMAL HIGH (ref 0.60–0.88)
Glucose, Bld: 86 mg/dL (ref 65–99)
POTASSIUM: 4.1 mmol/L (ref 3.5–5.3)
SODIUM: 143 mmol/L (ref 135–146)

## 2016-04-26 MED ORDER — FUROSEMIDE 20 MG PO TABS: 20.0000 mg | ORAL_TABLET | Freq: Every day | ORAL | 1 refills | Status: AC

## 2016-04-26 NOTE — Progress Notes (Signed)
Follow-up Outpatient Visit Date: 04/26/2016  Referring Provider: Blanchie Serve, MD [facility]  Chief Complaint: Hospital follow-up  HPI:  Monica Strong is a 82 y.o. year-old female with history of atrial fibrillation, hypertension, pulmonary embolism (05/2014), hiatal hernia, GERD, OSA, and renal stones, who has been referred by Dr. Bubba Camp for evaluation of chest pain after recent hospitalization. The patient was hospitalized in early 03/2016 after a fall. In the emergency department, she was found to have a urinary tract infection. During her hospitalization, the patient noted chest pain, prompting cardiology consultation. Troponin was found to be mildly elevated without a rise and fall. The patient remained in chronic atrial fibrillation on rivaroxaban. It should be noted that she presented after a fall. Since leaving the hospital, the patient has had at least one more fall, one of which resulted in a scalp laceration that was treated in the emergency department. The patient is unsure if she has had any further episodes of chest pain. She is currently without discomfort. The pain that she does remember is usually right sided. She does not know what brings it on or how long it lasts. She denies shortness of breath, palpitations, lightheadedness, and edema. Her primary concern is of feeling addled.  I spoke with the patient's daughter by phone, who states that the patient has not been complaining of chest pain recently. Monica Strong has considerable confusion that has progressed over the last several months. She is also concerned about the patient's falls the setting of ongoing anticoagulation for her history of atrial fibrillation and remote pulmonary embolism.  --------------------------------------------------------------------------------------------------  Cardiovascular History & Procedures: Cardiovascular Problems:  Chest pain  Atrial fibrillation  History of pulmonary  embolism  Risk Factors:  Hypertension and age  Cath/PCI:  None  CV Surgery:  None  EP Procedures and Devices:  None  Non-Invasive Evaluation(s):  Transthoracic echocardiogram (01/07/15): Normal LV size with mild LVH and low normal contraction (EF 50-55%). No regional wall motion abnormalities. Mitral annular calcification with mild MR noted. Aortic sclerosis present. Left atrium moderately dilated. Mild TR. Normal RV size and function.  Recent CV Pertinent Labs: Lab Results  Component Value Date   CHOL  07/16/2008    195        ATP III CLASSIFICATION:  <200     mg/dL   Desirable  200-239  mg/dL   Borderline High  >=240    mg/dL   High          HDL 54 07/16/2008   LDLCALC (H) 07/16/2008    122        Total Cholesterol/HDL:CHD Risk Coronary Heart Disease Risk Table                     Men   Women  1/2 Average Risk   3.4   3.3  Average Risk       5.0   4.4  2 X Average Risk   9.6   7.1  3 X Average Risk  23.4   11.0        Use the calculated Patient Ratio above and the CHD Risk Table to determine the patient's CHD Risk.        ATP III CLASSIFICATION (LDL):  <100     mg/dL   Optimal  100-129  mg/dL   Near or Above                    Optimal  130-159  mg/dL  Borderline  160-189  mg/dL   High  >190     mg/dL   Very High   TRIG 93 07/16/2008   CHOLHDL 3.6 07/16/2008   INR 1.55 03/17/2016   K 3.9 03/24/2016   MG 1.8 01/05/2015   BUN 18 03/24/2016   BUN 25 (A) 03/22/2016   CREATININE 0.80 03/24/2016    --------------------------------------------------------------------------------------------------  Past Medical History:  Diagnosis Date  . Arthritis    "hands" (01/05/2015)  . Atrial fibrillation ([place])   . Chest pain    a. Normal stress test 08/2008 and CTA neg for PE at that time.  . Dysuria    has urethral bump  . GERD (gastroesophageal reflux disease)   . H/O hiatal hernia   . Heart murmur MILD -- ASYMPTOMATIC  . History of infection due to ESBL  Escherichia coli 02/23/2014  . Hypertension   . Inner ear dysfunction    a. Prior h/o vertigo.  Marland Kitchen Neuromuscular disorder (HCC)    rt hand numbness  . Obesity   . OSA (obstructive sleep apnea)    not using cpap (01/05/2015)  . Paranoia ([place])   . Poor short term memory   . Psychotic episode    "was hospitalized at [facility] 07/2014; was having hallucinations; on RX now" (01/05/2015)  . Pulmonary embolism ([place]) ~ 05/2014  . Pulmonary nodules BENIGN--  MONITORED BY PCP DR READE--  ASYMPTOMATIC    LAST CHEST CT 08-17-2011  . Renal stones left  . Sinus drainage   . Ureteral stent retained   . Urge urinary incontinence     Past Surgical History:  Procedure Laterality Date  . APPENDECTOMY     PT STATES PER XRAY SMALL AMOUNT OF APPENDIX LEFT  . BLEPHAROPLASTY Bilateral   . CARDIOVASCULAR STRESS TEST  03-13-2007  dr Tressia Miners turner   NORMAL LV SIZE SYSTOLIC FUCTION/ NO ISCHEMIA/ EF 85%  . CATARACT EXTRACTION W/ INTRAOCULAR LENS  IMPLANT, BILATERAL    . CYSTOSCOPY W/ RETROGRADES  07/11/2012   Procedure: CYSTOSCOPY WITH RETROGRADE PYELOGRAM;  Surgeon: Alexis Frock, MD;  Location: [facility];  Service: Urology;  Laterality: Left;  left third stage ureteroscopystone manipulation  . CYSTOSCOPY W/ URETERAL STENT PLACEMENT  06/13/2012   Procedure: CYSTOSCOPY WITH STENT REPLACEMENT;  Surgeon: Alexis Frock, MD;  Location: [facility];  Service: Urology;  Laterality: Left;  . CYSTOSCOPY W/ URETERAL STENT REMOVAL  07/11/2012   Procedure: CYSTOSCOPY WITH STENT REMOVAL;  Surgeon: Alexis Frock, MD;  Location: [facility];  Service: Urology;  Laterality: Left;  . CYSTOSCOPY WITH RETROGRADE PYELOGRAM, URETEROSCOPY AND STENT PLACEMENT  05/14/2012   Procedure: CYSTOSCOPY WITH RETROGRADE PYELOGRAM, URETEROSCOPY AND STENT PLACEMENT;  Surgeon: Alexis Frock, MD;  Location: [facility];  Service: Urology;  Laterality: Left;  1ST STAGE LEFT URETEROSCOPY,  LEFT RETROGRADE, DIGITAL URETEROSCOPY,  LEFT STONE REMOVAL WITH ESCAPE BASKET,  STENT PLACEMENt   . CYSTOSCOPY/RETROGRADE/URETEROSCOPY/STONE EXTRACTION WITH BASKET  06/13/2012   Procedure: CYSTOSCOPY/RETROGRADE/URETEROSCOPY/STONE EXTRACTION WITH BASKET;  Surgeon: Alexis Frock, MD;  Location: [facility];  Service: Urology;  Laterality: Left;  . HOLMIUM LASER APPLICATION  [id number]   Procedure: HOLMIUM LASER APPLICATION;  Surgeon: Alexis Frock, MD;  Location: [facility];  Service: Urology;  Laterality: Left;  . HOLMIUM LASER APPLICATION  [id number]   Procedure: HOLMIUM LASER APPLICATION;  Surgeon: Alexis Frock, MD;  Location: [facility];  Service: Urology;  Laterality: Left;  . JOINT REPLACEMENT    . REMOVAL  BREAST CYST, BENIGN  1960's  . TONSILLECTOMY    . TOTAL KNEE ARTHROPLASTY Left 10-17-2009  DR Wynelle Link  . TOTAL KNEE ARTHROPLASTY Right 04-25-2009  . TRANSTHORACIC ECHOCARDIOGRAM  08-18-2008   NORMAL LVSF/ EF 55-60%/ MILD MITRAL REGURG .  Marland Kitchen URETEROSCOPY  07/11/2012   Procedure: URETEROSCOPY;  Surgeon: Alexis Frock, MD;  Location: [facility];  Service: Urology;  Laterality: Left;  Marland Kitchen [place]    Outpatient Encounter Prescriptions as of 04/26/2016  Medication Sig  . acetaminophen (TYLENOL) 325 MG tablet Take 650 mg by mouth every 6 (six) hours as needed for mild pain.  . benztropine (COGENTIN) 0.5 MG tablet Take 1 tablet (0.5 mg total) by mouth 2 (two) times daily.  . Cholecalciferol (VITAMIN D3) 2000 UNITS capsule Take 2,000 Units by mouth daily.  Marland Kitchen dextromethorphan (DELSYM) 30 MG/5ML liquid Take 60 mg by mouth every 12 (twelve) hours as needed for cough.  . diltiazem (DILACOR XR) 180 MG 24 hr capsule Take 180 mg by mouth daily.  . furosemide (LASIX) 40 MG tablet Take 40 mg by mouth daily.   Marland Kitchen ibuprofen (ADVIL,MOTRIN) 600 MG tablet Take 1 tablet (600 mg total) by mouth every 8 (eight) hours as needed for  mild pain or moderate pain.  . Multiple Vitamins-Minerals (MULTIVITAMIN PO) Take 1 tablet by mouth daily.  . polyethylene glycol (MIRALAX / GLYCOLAX) packet Take 17 g by mouth daily.  . ranitidine (ZANTAC) 300 MG tablet Take 300 mg by mouth at bedtime.   . risperiDONE (RISPERDAL) 0.5 MG tablet Take 0.5 mg by mouth every morning.   . rivaroxaban (XARELTO) 20 MG TABS tablet Take 20 mg by mouth daily.  . sertraline (ZOLOFT) 50 MG tablet Take 50 mg by mouth daily.  . [DISCONTINUED] risperiDONE (RISPERDAL) 1 MG tablet Take 1.5 mg by mouth at bedtime.   No facility-administered encounter medications on file as of 04/26/2016.     Allergies: Diphenhydramine; Flagyl [metronidazole]; Hydrocodone; Meperidine hcl; Penicillins; and Sulfonamide derivatives  Social History   Social History  . Marital status: Divorced    Spouse name: N/A  . Number of children: N/A  . Years of education: N/A   Occupational History  . Not on file.   Social History Main Topics  . Smoking status: Never Smoker  . Smokeless tobacco: Never Used  . Alcohol use No  . Drug use: No  . Sexual activity: Not on file   Other Topics Concern  . Not on file   Social History Narrative  . No narrative on file    Family History  Problem Relation Age of Onset  . Other      Pt unaware due to psych condition  . Heart attack Father     Review of Systems: Unable to perform due to the patient's cognitive impairment.  --------------------------------------------------------------------------------------------------  Physical Exam: BP 112/76   Pulse 93   Ht 5\' 1"  (1.549 m)   Wt 212 lb 6.4 oz (96.3 kg)   BMI 40.13 kg/m   General:  Obese, elderly woman seated comfortably in a wheelchair. HEENT: No conjunctival pallor or scleral icterus.  Dry mucous membranes.  OP clear. Neck: Supple without lymphadenopathy, thyromegaly, JVD, or HJR.  No carotid bruit. Lungs: Normal work of breathing.  Clear to auscultation bilaterally  without wheezes or crackles. Heart: Irregularly irregular without murmurs or rubs.  Non-displaced PMI. Abd: Bowel sounds present.  Soft, NT/ND. Unable to assess hepatosplenomegaly due to body habitus. Ext: No lower extremity edema.  Radial, PT, and DP pulses are 2+ bilaterally Skin: warm and dry without rash Neuro: CNIII-XII intact.  Strength and fine-touch sensation intact in upper and lower extremities bilaterally. Psych: Patient is alert and oriented but is somewhat confused regarding recent events and states that she is upset about her confusion.  EKG:  Atrial fibrillation (HR 85 bpm) with poor R-wave progression and inferolateral leads.  T-wave changes are more pronounced than on the prior tracing from 03/24/16 but very similar to the electrocardiogram from 03/17/16.  Lab Results  Component Value Date   WBC 9.3 03/24/2016   HGB 14.4 03/24/2016   HCT 44.1 03/24/2016   MCV 97.1 03/24/2016   PLT 197 03/24/2016    Lab Results  Component Value Date   NA 140 03/24/2016   K 3.9 03/24/2016   CL 105 03/24/2016   CO2 25 03/24/2016   BUN 18 03/24/2016   CREATININE 0.80 03/24/2016   GLUCOSE 111 (H) 03/24/2016   ALT 24 03/24/2016    Lab Results  Component Value Date   CHOL  07/16/2008    195        ATP III CLASSIFICATION:  <200     mg/dL   Desirable  200-239  mg/dL   Borderline High  >=240    mg/dL   High          HDL 54 07/16/2008   LDLCALC (H) 07/16/2008    122        Total Cholesterol/HDL:CHD Risk Coronary Heart Disease Risk Table                     Men   Women  1/2 Average Risk   3.4   3.3  Average Risk       5.0   4.4  2 X Average Risk   9.6   7.1  3 X Average Risk  23.4   11.0        Use the calculated Patient Ratio above and the CHD Risk Table to determine the patient's CHD Risk.        ATP III CLASSIFICATION (LDL):  <100     mg/dL   Optimal  100-129  mg/dL   Near or Above                    Optimal  130-159  mg/dL   Borderline  160-189  mg/dL   High  >190      mg/dL   Very High   TRIG 93 07/16/2008   CHOLHDL 3.6 07/16/2008   --------------------------------------------------------------------------------------------------  ASSESSMENT AND PLAN: Chest pain It is difficult to obtain a clear history from the patient. Her daughter notes that the patient has not been complaining of this recently. EKG today is similar to prior tracings with atrial fibrillation and inferolateral T-wave changes. Echocardiogram last year was also notable for severe LVH, which may be leading to repolarization abnormalities on the EKG. Given her advanced age and other medical comorbidities, the patient, her daughter, and I have agreed to forego further ischemia evaluation at this time. If she develops anginal symptoms in the future, we could consider stress testing versus cardiac catheterization.  Atrial fibrillation Heart rate is adequately controlled today with diltiazem. The patient remains on rivaroxaban despite multiple falls over the last several months, two of which have led to hospitalization or ED visit. While her CHADSVASC score of at least 4 would ordinarily warrant continued anticoagulation, I am concerned about her recent falls and the  risk for life-threatening bleeding. After speaking with the patient and her daughter, we have agreed to discontinue rivaroxaban and initiate aspirin 81 mg daily. The patient should remain on her current diltiazem dose.  Confusion This is likely multifactorial and subacute to chronic. The patient appears somewhat dry on exam today with ongoing diuretic therapy. We have agreed to decrease furosemide to 20 mg daily. I will check a basic metabolic panel to ensure that her renal function and electrolytes remain normal.  Follow-up: Return to clinic in 3 months.  More than 45 minutes was spent interviewing and examining the patient, speaking with the patient's daughter by phone, and reviewing her prior records.  At least 50% of the time was  spent face-to-face with the patient.  Nelva Bush, MD 04/26/2016 11:07 AM

## 2016-04-26 NOTE — Patient Instructions (Signed)
Medication Instructions:  Stop Xarelto.  Start aspirin 81mg  daily.  Decrease lasix (furosemide) to 20mg  daily   Labwork: BMET today  Testing/Procedures: None   Follow-Up: Your physician recommends that you schedule a follow-up appointment in: 3 months with Dr End.          If you need a refill on your cardiac medications before your next appointment, please call your pharmacy.

## 2016-04-30 ENCOUNTER — Telehealth: Payer: Self-pay | Admitting: Internal Medicine

## 2016-04-30 NOTE — Telephone Encounter (Signed)
F/u Message ° °Pt daughter returning RN call. Please call back to discuss  °

## 2016-04-30 NOTE — Telephone Encounter (Signed)
Spoke with patient's daughter, Shirlean Mylar, about recent lab results.

## 2016-04-30 NOTE — Telephone Encounter (Signed)
LMTCB for Monica Strong 

## 2016-05-23 ENCOUNTER — Non-Acute Institutional Stay (SKILLED_NURSING_FACILITY): Payer: Medicare Other | Admitting: Adult Health

## 2016-05-23 ENCOUNTER — Encounter: Payer: Self-pay | Admitting: Adult Health

## 2016-05-23 DIAGNOSIS — F0391 Unspecified dementia with behavioral disturbance: Secondary | ICD-10-CM | POA: Diagnosis not present

## 2016-05-23 DIAGNOSIS — F329 Major depressive disorder, single episode, unspecified: Secondary | ICD-10-CM | POA: Diagnosis not present

## 2016-05-23 DIAGNOSIS — R2681 Unsteadiness on feet: Secondary | ICD-10-CM | POA: Diagnosis not present

## 2016-05-23 DIAGNOSIS — I5032 Chronic diastolic (congestive) heart failure: Secondary | ICD-10-CM

## 2016-05-23 DIAGNOSIS — K219 Gastro-esophageal reflux disease without esophagitis: Secondary | ICD-10-CM

## 2016-05-23 DIAGNOSIS — K5901 Slow transit constipation: Secondary | ICD-10-CM | POA: Diagnosis not present

## 2016-05-23 DIAGNOSIS — G259 Extrapyramidal and movement disorder, unspecified: Secondary | ICD-10-CM | POA: Diagnosis not present

## 2016-05-23 DIAGNOSIS — F29 Unspecified psychosis not due to a substance or known physiological condition: Secondary | ICD-10-CM

## 2016-05-23 DIAGNOSIS — I482 Chronic atrial fibrillation, unspecified: Secondary | ICD-10-CM

## 2016-05-23 NOTE — Progress Notes (Signed)
Patient ID: Monica Strong, female   DOB: 1931/09/07, 82 y.o.   MRN: [id number]    DATE:    05/23/16  MRN:  [id number]  BIRTHDAY: [birth year]  Facility:  [facility] Home Location:  [place] Room Number: 1103-A  LEVEL OF CARE:  SNF [phone])  Contact Information    Name Relation Home Work Mobile   McSwain,[name] Daughter [phone]   McSwain,Tim Other [phone]       Code Status History    Date Active Date Inactive Code Status Order ID Comments User Context   03/17/2016  3:07 PM 03/20/2016  9:12 PM Full Code [id number]  Maren Reamer, MD ED   01/05/2015  7:01 PM 01/07/2015  6:31 PM DNR [id number]  Melton Alar, PA-C Inpatient   04/15/2014  4:03 PM 04/20/2014  8:40 PM Full Code [id number]  Oswald Hillock, MD Inpatient   04/05/2014  5:25 PM 04/06/2014  7:23 PM Full Code [id number]  Delfina Redwood, MD Inpatient   02/17/2014  2:07 AM 02/23/2014  9:31 PM Full Code [id number]  Allyne Gee, MD Inpatient       Chief Complaint  Patient presents with  . Medical Management of Chronic Issues    HISTORY OF PRESENT ILLNESS:  This is an 82 year old female who is being seen for a routine visit. She is currently having PT for gait training. Her Zoloft dosage was recently increased to 100 mg PO Q D. Xarelto was discontinued and Lasix dosage was decreased to 20 mg daily.  She has PMH of dementia, psychosis and untreated sleep apnea. She has been admitted to [facility] on 03/20/16 from [facility] . She lives in an ALF wherein she was found down. She sustained a head laceration. She was diagnosed to have UTI and empirically treated with Ciprofloxacin and was switched to Meropenem. She was switched back to Ciprofloxacin PO with urine culture showing pan-sensitive. She complained of chest pain with EKG showing atrial fibrillation. It was thought to be from costochondritis. Staple was placed on her head  laceration.   PAST MEDICAL HISTORY:  Past Medical History:  Diagnosis Date  . Arthritis    "hands" (01/05/2015)  . Atrial fibrillation ([place])   . Chest pain    a. Normal stress test 08/2008 and CTA neg for PE at that time.  . Dysuria    has urethral bump  . GERD (gastroesophageal reflux disease)   . H/O hiatal hernia   . Heart murmur MILD -- ASYMPTOMATIC  . History of infection due to ESBL Escherichia coli 02/23/2014  . Hypertension   . Inner ear dysfunction    a. Prior h/o vertigo.  Marland Kitchen Neuromuscular disorder (HCC)    rt hand numbness  . Obesity   . OSA (obstructive sleep apnea)    not using cpap (01/05/2015)  . Paranoia ([place])   . Poor short term memory   . Psychotic episode    "was hospitalized at [facility] 07/2014; was having hallucinations; on RX now" (01/05/2015)  . Pulmonary embolism ([place]) ~ 05/2014  . Pulmonary nodules BENIGN--  MONITORED BY PCP DR READE--  ASYMPTOMATIC    LAST CHEST CT 08-17-2011  . Renal stones left  . Sinus drainage   . Ureteral stent retained   . Urge urinary incontinence      CURRENT MEDICATIONS: Reviewed  Patient's Medications  New Prescriptions   No medications on file  Previous Medications   ACETAMINOPHEN (TYLENOL) 325  MG TABLET    Take 650 mg by mouth every 6 (six) hours as needed for mild pain.   ASPIRIN EC 81 MG TABLET    Take 1 tablet (81 mg total) by mouth daily.   BENZTROPINE (COGENTIN) 0.5 MG TABLET    Take 1 tablet (0.5 mg total) by mouth 2 (two) times daily.   CHOLECALCIFEROL (VITAMIN D3) 2000 UNITS CAPSULE    Take 2,000 Units by mouth daily.   DEXTROMETHORPHAN (DELSYM) 30 MG/5ML LIQUID    Take 60 mg by mouth every 12 (twelve) hours as needed for cough.   DILTIAZEM (DILACOR XR) 180 MG 24 HR CAPSULE    Take 180 mg by mouth daily.    FUROSEMIDE (LASIX) 20 MG TABLET    Take 1 tablet (20 mg total) by mouth daily.   MULTIPLE VITAMINS-MINERALS (MULTIVITAMIN PO)    Take 1 tablet by mouth daily.   POLYETHYLENE GLYCOL (MIRALAX / GLYCOLAX)  PACKET    Take 17 g by mouth daily.   RANITIDINE (ZANTAC) 300 MG TABLET    Take 300 mg by mouth at bedtime.    RISPERIDONE (RISPERDAL) 0.5 MG TABLET    Take 0.5 mg by mouth every morning.    RISPERIDONE (RISPERDAL) 1 MG TABLET    Take 1.5 mg by mouth at bedtime. Take 1.5 tablets to = 1.5 mg qhs    SERTRALINE (ZOLOFT) 50 MG TABLET    Take 50 mg by mouth daily.  Modified Medications   No medications on file  Discontinued Medications   IBUPROFEN (ADVIL,MOTRIN) 600 MG TABLET    Take 1 tablet (600 mg total) by mouth every 8 (eight) hours as needed for mild pain or moderate pain.     Allergies  Allergen Reactions  . Diphenhydramine Nausea Only    Sick to stomach  . Flagyl [Metronidazole] Other (See Comments)    HALLUCINATIONS  . Hydrocodone Other (See Comments)    HALLUCINATIONS  . Meperidine Hcl Nausea And Vomiting  . Penicillins Hives  . Sulfonamide Derivatives Hives     REVIEW OF SYSTEMS:  Unable to obtain due to being sleepy   PHYSICAL EXAMINATION  GENERAL APPEARANCE: Well nourished. In no acute distress. Obese SKIN:  Skin is warm and dry HEAD: Normal in size and contour. No evidence of trauma EYES: Lids open and close normally. No blepharitis, entropion or ectropion. PERRL. Conjunctivae are clear and sclerae are white. Lenses are without opacity EARS: Pinnae are normal. Patient hears normal voice tunes of the examiner MOUTH and THROAT: Lips are without lesions. Oral mucosa is moist and without lesions. Tongue is normal in shape, size, and color and without lesions NECK: supple, trachea midline, no neck masses, no thyroid tenderness, no thyromegaly LYMPHATICS: no LAN in the neck, no supraclavicular LAN RESPIRATORY: breathing is even & unlabored, BS CTAB CARDIAC: irregularly irregular, no murmur,no extra heart sounds, no edema GI: abdomen soft, normal BS, no masses, no tenderness, no hepatomegaly, no splenomegaly EXTREMITIES:  Able to move X 4 extremities PSYCHIATRIC:   Disoriented X3; Affect and mood is appropriate    LABS/RADIOLOGY: Labs reviewed: Basic Metabolic Panel:  Recent Labs  03/18/16 0521 03/22/16 03/24/16 1431 04/26/16 1145  NA 139 148* 140 143  K 3.5 4.1 3.9 4.1  CL 104  --  105 106  CO2 28  --  25 29  GLUCOSE 101*  --  111* 86  BUN 16 25* 18 20  CREATININE 0.95 0.9 0.80 0.92*  CALCIUM 9.2  --  9.5 9.4  Liver Function Tests:  Recent Labs  03/17/16 0815 03/24/16 1431  AST 16 21  ALT 12* 24  ALKPHOS 69 82  BILITOT 0.6 0.5  PROT 5.9* 6.1*  ALBUMIN 3.4* 3.6   CBC:  Recent Labs  03/17/16 0815 03/18/16 0521 03/18/16 1229 03/22/16 03/24/16 1431  WBC 7.7 7.5  --  6.4 9.3  NEUTROABS 5.4  --   --  4 6.7  HGB 13.6 14.4  --  14.9 14.4  HCT 42.7 45.7 41.1 46 44.1  MCV 99.3 99.3  --   --  97.1  PLT 174 196  --  207 197   Cardiac Enzymes:  Recent Labs  03/18/16 2037 03/19/16 0231 03/19/16 1128  TROPONINI 0.16* 0.19* 0.14*       ASSESSMENT/PLAN:   Gait instability - continue rehabilitation with physical therapy, for therapeutic strengthening exercises; fall precaution  Chronic atrial fibrillation - rate-controlled; continue Diltiazem 24HR CD 180 mg 1 capsule PO from Q AM and ASA 81 mg daily  Chronic diastolic CHF - no SOB; continue Lasix 20 mg 1 tab PO Q D; follow-up with cardiology  Dementia with Psychosis -  continue supportive care; fall precaution; continue Rispridone 0.5 mg PO Q AM and 1.5 mg Q HS; psychiatric consult with [facility]; ST treatments for cognitive deficits  EPS - continue Benztropine 0.5 mg 1 tab PO BID  Constipation - continue Miralax 17 gm PO Q D  GERD - continue Zantac 300 mg 1 tab PO Q HS  Depression - continue Zoloft 100 mg 1 tab PO Q D     Goals of care:  Long-term care    Durenda Age, NP [place] [phone]

## 2016-06-11 ENCOUNTER — Emergency Department (HOSPITAL_COMMUNITY)
Admission: EM | Admit: 2016-06-11 | Discharge: 2016-07-12 | Disposition: E | Payer: Medicare Other | Attending: Emergency Medicine | Admitting: Emergency Medicine

## 2016-06-11 DIAGNOSIS — I469 Cardiac arrest, cause unspecified: Secondary | ICD-10-CM

## 2016-06-11 DIAGNOSIS — Z7982 Long term (current) use of aspirin: Secondary | ICD-10-CM | POA: Insufficient documentation

## 2016-06-11 DIAGNOSIS — Z96653 Presence of artificial knee joint, bilateral: Secondary | ICD-10-CM | POA: Insufficient documentation

## 2016-06-11 DIAGNOSIS — I10 Essential (primary) hypertension: Secondary | ICD-10-CM | POA: Insufficient documentation

## 2016-06-11 DIAGNOSIS — Z79899 Other long term (current) drug therapy: Secondary | ICD-10-CM | POA: Insufficient documentation

## 2016-06-12 MED FILL — Medication: Qty: 1 | Status: AC

## 2016-07-01 IMAGING — CT CT HEAD W/O CM
2 series · 15 of 30 positions shown, 19 images · non-contrast
Comparison: 04/16/2014

CLINICAL DATA: Fell wall walking down the hall. Altered mental
status

EXAM:
CT HEAD WITHOUT CONTRAST
TECHNIQUE: Contiguous axial images were obtained from the base of the skull
through the vertex without intravenous contrast.

[Series 201: head w/o, idose (1) · axial · non-contrast · 0.49mm/px · z∈[+69,+204]mm · 13 of 33 slices shown, 17 images]
[im 3/33  brain]
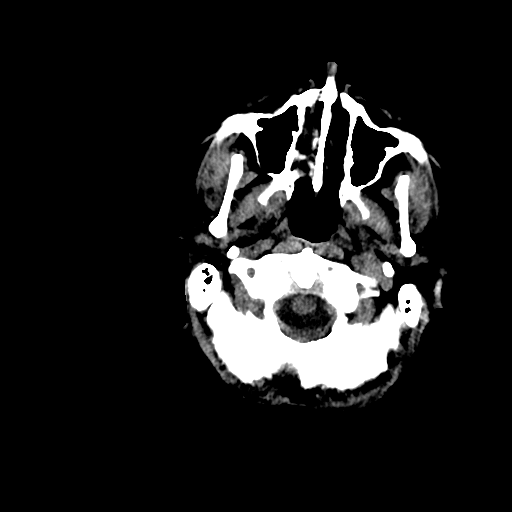
[im 3/33  bone]
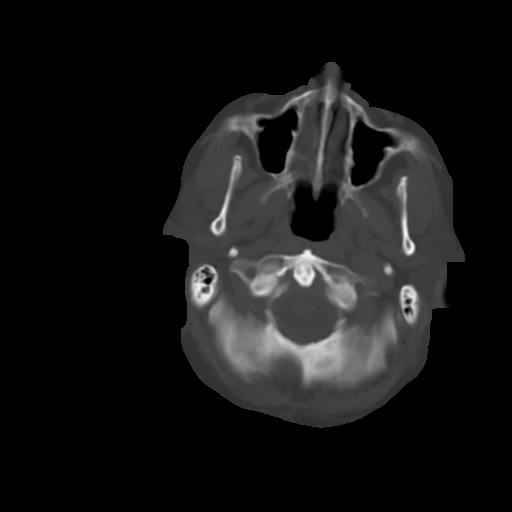
[im 5/33  brain]
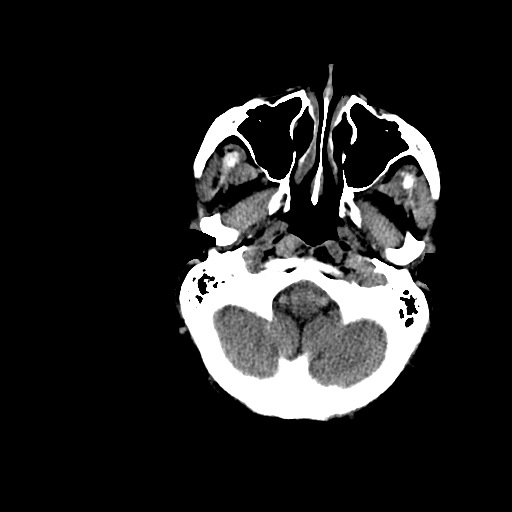
[im 7/33  brain]
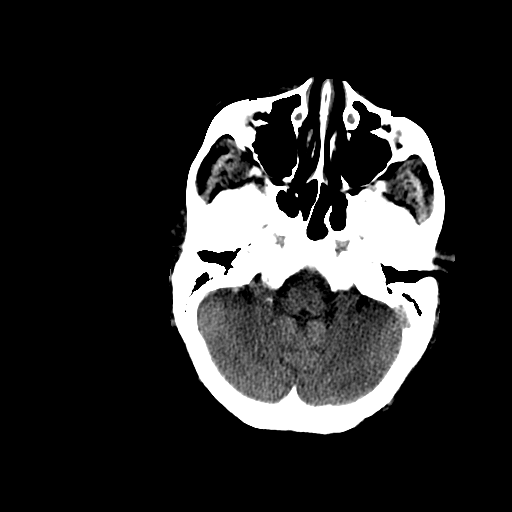
[im 10/33  brain]
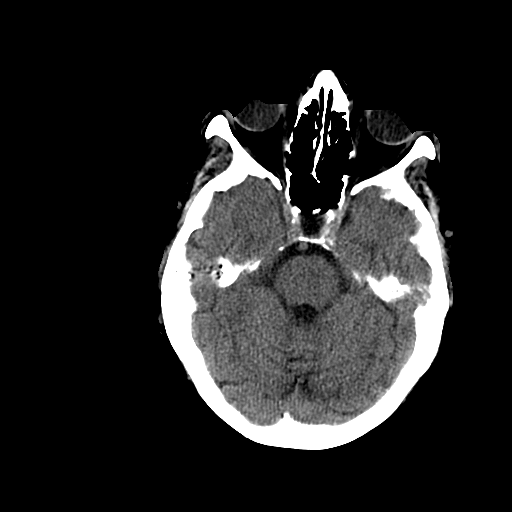
[im 12/33  brain]
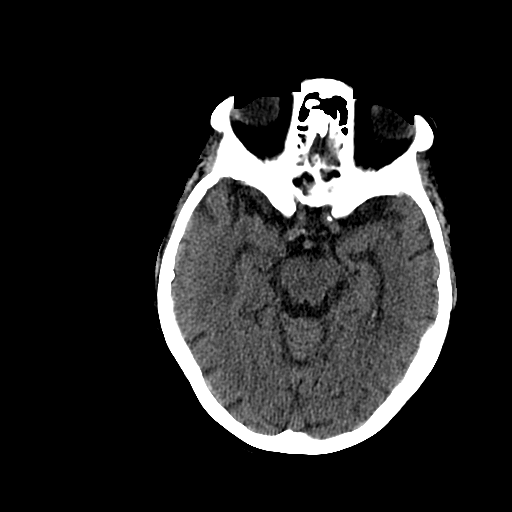
[im 12/33  bone]
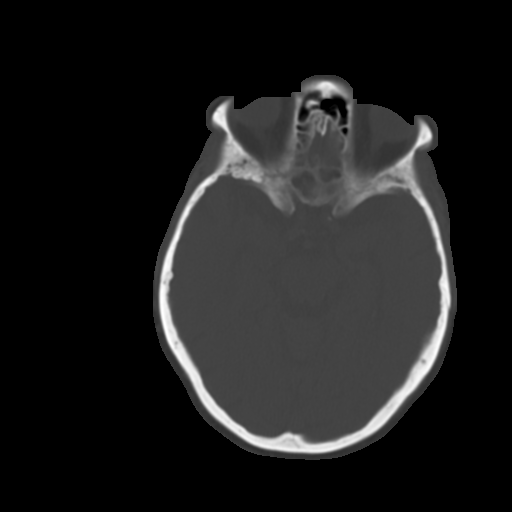
[im 14/33  brain]
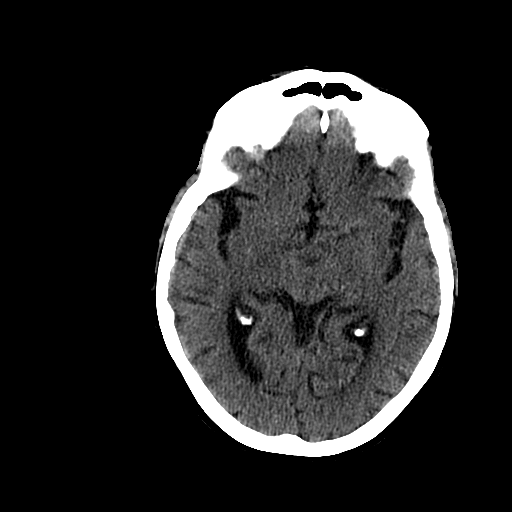
[im 17/33  brain]
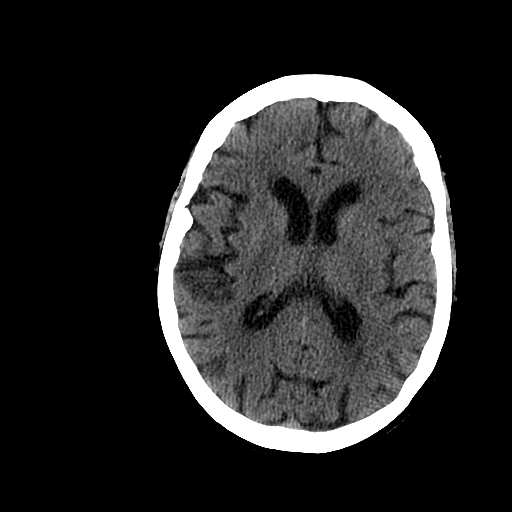
[im 19/33  brain]
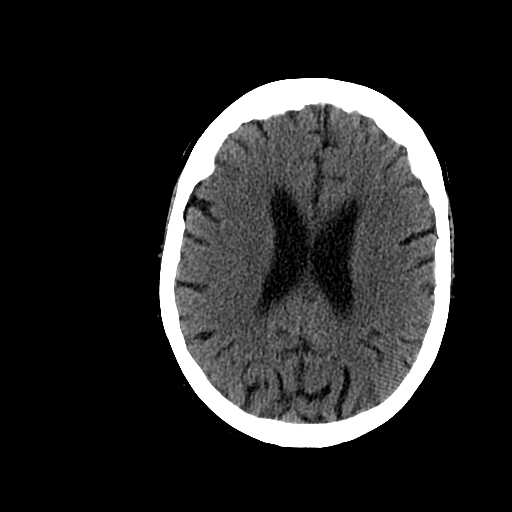
[im 21/33  brain]
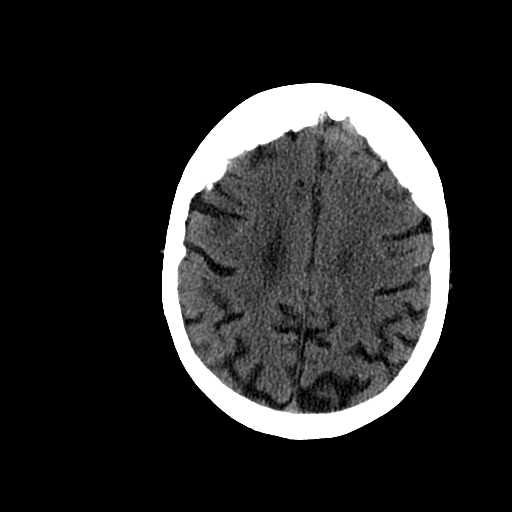
[im 21/33  bone]
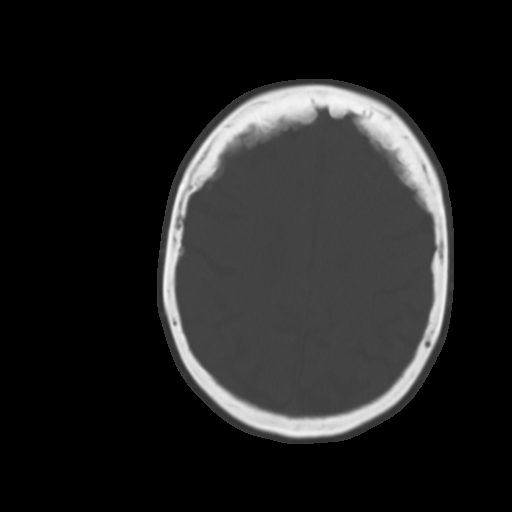
[im 23/33  brain]
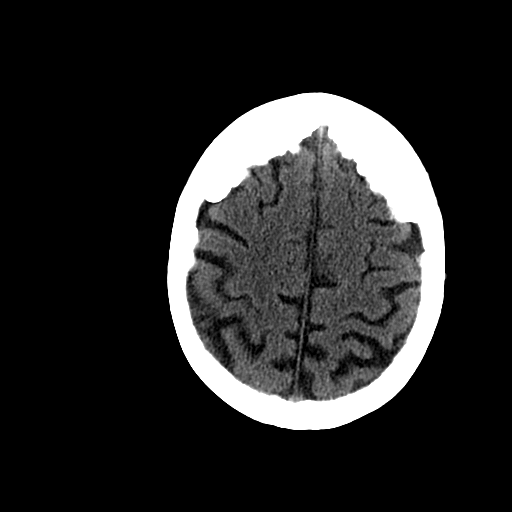
[im 26/33  brain]
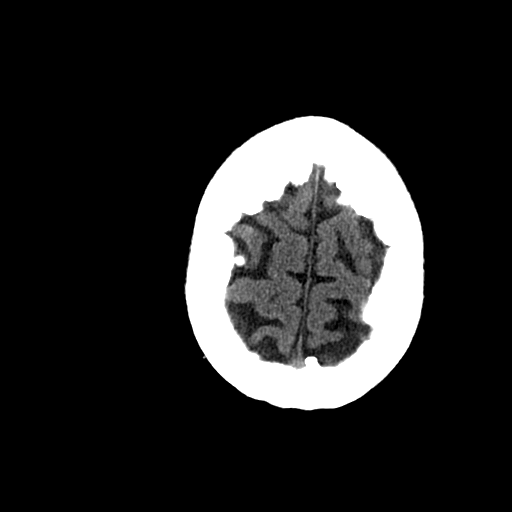
[im 28/33  brain]
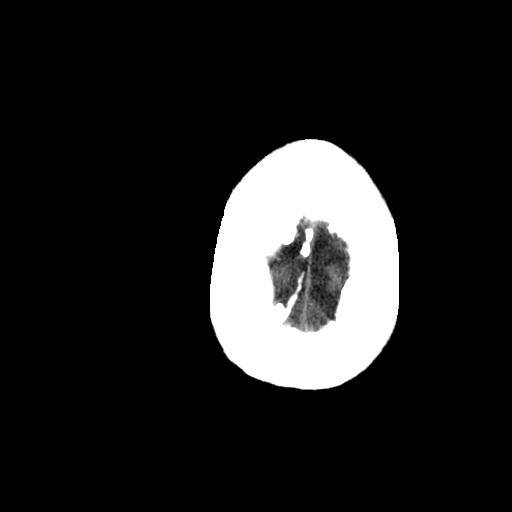
[im 30/33  brain]
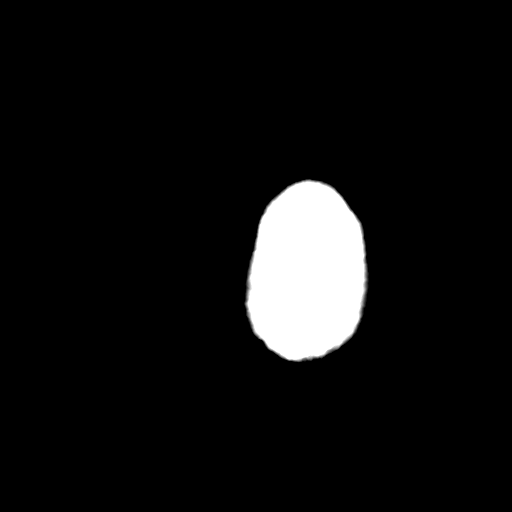
[im 30/33  bone]
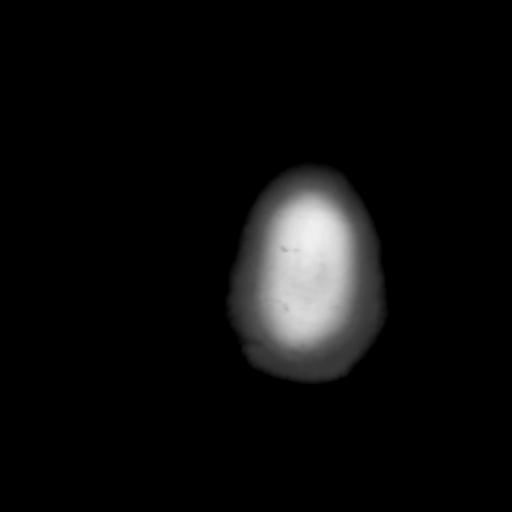

[Series 202: head w/o bone, idose (1) · axial · non-contrast · 0.49mm/px · z∈[+69,+89]mm · 2 of 33 slices shown]
[im 3/33  bone]
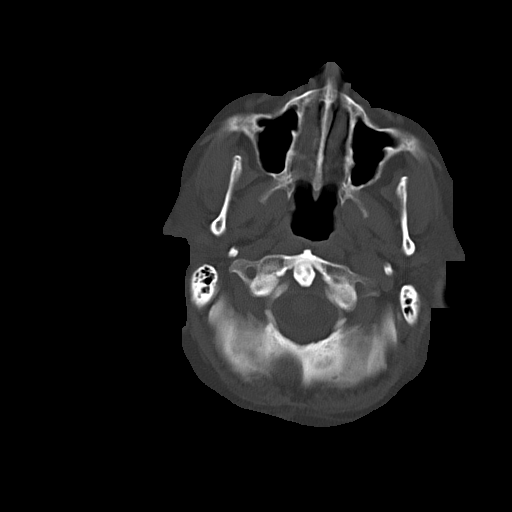
[im 7/33  bone]
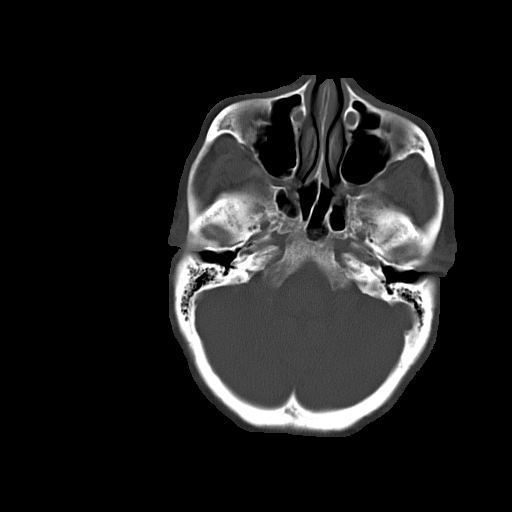

[15 of 30 positions shown; findings below may reference images not displayed]

FINDINGS: There is no evidence of mass effect, midline shift, or extra-axial
fluid collections. There is no evidence of a space-occupying lesion
or intracranial hemorrhage. There is no evidence of a cortical-based
area of acute infarction. There is generalized cerebral atrophy.
There is periventricular white matter low attenuation likely
secondary to microangiopathy.

The ventricles and sulci are appropriate for the patient's age. The
basal cisterns are patent.

Visualized portions of the orbits are unremarkable. The visualized
portions of the paranasal sinuses and mastoid air cells are
unremarkable.

The osseous structures are unremarkable.
IMPRESSION: 1. No acute intracranial pathology.
2. Chronic microvascular disease and cerebral atrophy.

## 2016-07-12 NOTE — ED Provider Notes (Signed)
[facility] DEPT Provider Note   CSN: [id number] Arrival date & time: 06-20-2016  1018     History   Chief Complaint Chief Complaint  Patient presents with  . CPR    HPI Monica Strong is a 83 y.o. female.  Patient is a 83 year old female who resides at [facility]. She has a history of dementia, atrial for relation, hypertension. She presents in cardiac arrest. Per EMS she lives in a PE a rhythm initially. She's been in PDA the entire time other than one episode of [place] which showed a sinus tachycardia. His had a downtime now of approximately one hour, 30-45 minutes with [facility] CPR. She's gotten 7 epis.        Past Medical History:  Diagnosis Date  . Arthritis    "hands" (01/05/2015)  . Atrial fibrillation ([place])   . Chest pain    a. Normal stress test 08/2008 and CTA neg for PE at that time.  . Dysuria    has urethral bump  . GERD (gastroesophageal reflux disease)   . H/O hiatal hernia   . Heart murmur MILD -- ASYMPTOMATIC  . History of infection due to ESBL Escherichia coli 02/23/2014  . Hypertension   . Inner ear dysfunction    a. Prior h/o vertigo.  Marland Kitchen Neuromuscular disorder (HCC)    rt hand numbness  . Obesity   . OSA (obstructive sleep apnea)    not using cpap (01/05/2015)  . Paranoia ([place])   . Poor short term memory   . Psychotic episode    "was hospitalized at [facility] 07/2014; was having hallucinations; on RX now" (01/05/2015)  . Pulmonary embolism ([place]) ~ 05/2014  . Pulmonary nodules BENIGN--  MONITORED BY PCP DR READE--  ASYMPTOMATIC    LAST CHEST CT 08-17-2011  . Renal stones left  . Sinus drainage   . Ureteral stent retained   . Urge urinary incontinence     Patient Active Problem List   Diagnosis Date Noted  . Chest pain   . UTI (urinary tract infection) 03/17/2016  . Laceration of head 03/17/2016  . Chronic venous stasis dermatitis 03/17/2016  . Nodule of right lung 01/17/2015  . Elevated troponin 01/05/2015  . Abdominal  pain 01/05/2015  . Knee pain, right 01/05/2015  . Dementia 01/05/2015  . Fall 01/05/2015  . Hilar adenopathy 05/15/2014  . Faintness   . PE (pulmonary embolism)   . Thrombocytopenia ([place]) 04/15/2014  . LOC (loss of consciousness) ([place]) 04/15/2014  . Transient loss of consciousness 04/05/2014  . Syncope 04/05/2014  . History of infection due to ESBL Escherichia coli 02/23/2014  . Demand ischemia ([place]) 02/23/2014  . Psychosis 02/22/2014  . Atrial fibrillation ([place]) 02/17/2014  . Altered mental state 02/17/2014  . Paranoia ([place]) 02/17/2014  . Troponin level elevated 02/17/2014  . Unspecified vitamin D deficiency 02/19/2013  . Osteopenia 02/19/2013  . Morbid obesity ([place]) 06/14/2010  . Obstructive sleep apnea 06/14/2010  . GERD 06/14/2010  . DIVERTICULOSIS OF COLON 06/14/2010  . RECTAL BLEEDING 06/14/2010  . COLONIC POLYPS, HX OF 06/14/2010    Past Surgical History:  Procedure Laterality Date  . APPENDECTOMY     PT STATES PER XRAY SMALL AMOUNT OF APPENDIX LEFT  . BLEPHAROPLASTY Bilateral   . CARDIOVASCULAR STRESS TEST  03-13-2007  dr Tressia Miners turner   NORMAL LV SIZE SYSTOLIC FUCTION/ NO ISCHEMIA/ EF 85%  . CATARACT EXTRACTION W/ INTRAOCULAR LENS  IMPLANT, BILATERAL    . CYSTOSCOPY W/ RETROGRADES  07/11/2012  Procedure: CYSTOSCOPY WITH RETROGRADE PYELOGRAM;  Surgeon: Alexis Frock, MD;  Location: [facility];  Service: Urology;  Laterality: Left;  left third stage ureteroscopystone manipulation  . CYSTOSCOPY W/ URETERAL STENT PLACEMENT  06/13/2012   Procedure: CYSTOSCOPY WITH STENT REPLACEMENT;  Surgeon: Alexis Frock, MD;  Location: [facility];  Service: Urology;  Laterality: Left;  . CYSTOSCOPY W/ URETERAL STENT REMOVAL  07/11/2012   Procedure: CYSTOSCOPY WITH STENT REMOVAL;  Surgeon: Alexis Frock, MD;  Location: [facility];  Service: Urology;  Laterality: Left;  . CYSTOSCOPY WITH RETROGRADE PYELOGRAM, URETEROSCOPY AND STENT  PLACEMENT  05/14/2012   Procedure: CYSTOSCOPY WITH RETROGRADE PYELOGRAM, URETEROSCOPY AND STENT PLACEMENT;  Surgeon: Alexis Frock, MD;  Location: [facility];  Service: Urology;  Laterality: Left;  1ST STAGE LEFT URETEROSCOPY, LEFT RETROGRADE, DIGITAL URETEROSCOPY,  LEFT STONE REMOVAL WITH ESCAPE BASKET,  STENT PLACEMENt   . CYSTOSCOPY/RETROGRADE/URETEROSCOPY/STONE EXTRACTION WITH BASKET  06/13/2012   Procedure: CYSTOSCOPY/RETROGRADE/URETEROSCOPY/STONE EXTRACTION WITH BASKET;  Surgeon: Alexis Frock, MD;  Location: [facility];  Service: Urology;  Laterality: Left;  . HOLMIUM LASER APPLICATION  [id number]   Procedure: HOLMIUM LASER APPLICATION;  Surgeon: Alexis Frock, MD;  Location: [facility];  Service: Urology;  Laterality: Left;  . HOLMIUM LASER APPLICATION  [id number]   Procedure: HOLMIUM LASER APPLICATION;  Surgeon: Alexis Frock, MD;  Location: [facility];  Service: Urology;  Laterality: Left;  . JOINT REPLACEMENT    . REMOVAL BREAST CYST, BENIGN  1960's  . TONSILLECTOMY    . TOTAL KNEE ARTHROPLASTY Left 10-17-2009  DR Wynelle Link  . TOTAL KNEE ARTHROPLASTY Right 04-25-2009  . TRANSTHORACIC ECHOCARDIOGRAM  08-18-2008   NORMAL LVSF/ EF 55-60%/ MILD MITRAL REGURG .  Marland Kitchen URETEROSCOPY  07/11/2012   Procedure: URETEROSCOPY;  Surgeon: Alexis Frock, MD;  Location[facility];  Service: Urology;  Laterality: Left;  Marland Kitchen VAGINAL HYSTERECTOMY  1970    OB History    No data available       Home Medications    Prior to Admission medications   Medication Sig Start Date End Date Taking? Authorizing Provider  acetaminophen (TYLENOL) 325 MG tablet Take 650 mg by mouth every 6 (six) hours as needed for mild pain.    Historical Provider, MD  aspirin EC 81 MG tablet Take 1 tablet (81 mg total) by mouth daily. 04/26/16   Nelva Bush, MD  benztropine (COGENTIN) 0.5 MG tablet Take 1 tablet (0.5 mg total) by mouth 2 (two) times  daily. 01/07/15   Shanker Kristeen Mans, MD  Cholecalciferol (VITAMIN D3) 2000 UNITS capsule Take 2,000 Units by mouth daily.    Historical Provider, MD  dextromethorphan (DELSYM) 30 MG/5ML liquid Take 60 mg by mouth every 12 (twelve) hours as needed for cough.    Historical Provider, MD  diltiazem (DILACOR XR) 180 MG 24 hr capsule Take 180 mg by mouth daily.     Historical Provider, MD  furosemide (LASIX) 20 MG tablet Take 1 tablet (20 mg total) by mouth daily. 04/26/16 07/25/16  Nelva Bush, MD  Multiple Vitamins-Minerals (MULTIVITAMIN PO) Take 1 tablet by mouth daily.    Historical Provider, MD  polyethylene glycol (MIRALAX / GLYCOLAX) packet Take 17 g by mouth daily. 04/20/14   Oswald Hillock, MD  ranitidine (ZANTAC) 300 MG tablet Take 300 mg by mouth at bedtime.     Historical Provider, MD  risperiDONE (RISPERDAL) 0.5 MG tablet Take 0.5 mg by mouth every morning.  Historical Provider, MD  risperiDONE (RISPERDAL) 1 MG tablet Take 1.5 mg by mouth at bedtime. Take 1.5 tablets to = 1.5 mg qhs     Historical Provider, MD  sertraline (ZOLOFT) 50 MG tablet Take 50 mg by mouth daily.    Historical Provider, MD    Family History Family History  Problem Relation Age of Onset  . Other      Pt unaware due to psych condition  . Heart attack Father     Social History Social History  Substance Use Topics  . Smoking status: Never Smoker  . Smokeless tobacco: Never Used  . Alcohol use No     Allergies   Diphenhydramine; Flagyl [metronidazole]; Hydrocodone; Meperidine hcl; Penicillins; and Sulfonamide derivatives   Review of Systems Review of Systems  Unable to perform ROS: Patient unresponsive     Physical Exam Updated Vital Signs BP (!) 0/0   Pulse (!) 0   Resp (!) 0   Ht 5\' 1"  (1.549 m)   Wt 207 lb (93.9 kg)   BMI 39.11 kg/m   Physical Exam  Constitutional:  Obese  HENT:  Head: Normocephalic and atraumatic.  Eyes:  Pupils Fixed and dilated  Cardiovascular:  No cardiac  activity  Pulmonary/Chest:  King airway in place. No spontaneous respirations. There is bilateral breath sounds with ventilation  Abdominal: She exhibits distension.  Neurological:  Unresponsive  Skin:  Cold and mottled     ED Treatments / Results  Labs (all labs ordered are listed, but only abnormal results are displayed) Labs Reviewed - No data to display  EKG  EKG Interpretation None       Radiology No results found.  Procedures Procedures (including critical care time)  Medications Ordered in ED Medications - No data to display   Initial Impression / Assessment and Plan / ED Course  I have reviewed the triage vital signs and the nursing notes.  Pertinent labs & imaging results that were available during my care of the patient were reviewed by me and considered in my medical decision making (see chart for details).  Clinical Course     CPR was continued with the [facility] in place. Patient remained in a PEA rhythm. There is no palpable pulses. There is no cardiac activity noted on bedside ultrasound that was done by me. Capnography readings have dropped to 6. Resuscitation efforts were ceased. I contacted Dr. Hollace Kinnier with [facility] care who has agreed to sign the death certificate.  Final Clinical Impressions(s) / ED Diagnoses   Final diagnoses:  Cardiac arrest [facility])    New Prescriptions New Prescriptions   No medications on file     Malvin Johns, MD 2016-06-30 1101

## 2016-07-12 NOTE — ED Notes (Signed)
Family at bedside. 

## 2016-07-12 NOTE — ED Triage Notes (Signed)
See code narrator 

## 2016-07-12 DEATH — deceased

## 2016-07-26 ENCOUNTER — Ambulatory Visit: Payer: Self-pay | Admitting: Internal Medicine

## 2017-09-11 IMAGING — CT CT CERVICAL SPINE W/O CM
5 of 8 series · 11 of 33 positions shown, 12 images · non-contrast
Comparison: CT of the head on 01/05/2015

CLINICAL DATA: Fall with posterior scalp laceration. History of
dementia. Initial encounter.

EXAM:
CT HEAD WITHOUT CONTRAST
CT CERVICAL SPINE WITHOUT CONTRAST
TECHNIQUE: Multidetector CT imaging of the head and cervical spine was
performed following the standard protocol without intravenous
contrast. Multiplanar CT image reconstructions of the cervical spine
were also generated.

[Series 4: head bone · axial · 0.43mm/px · z∈[-62,-10]mm · 2 of 79 slices shown]
[im 27/79  bone]
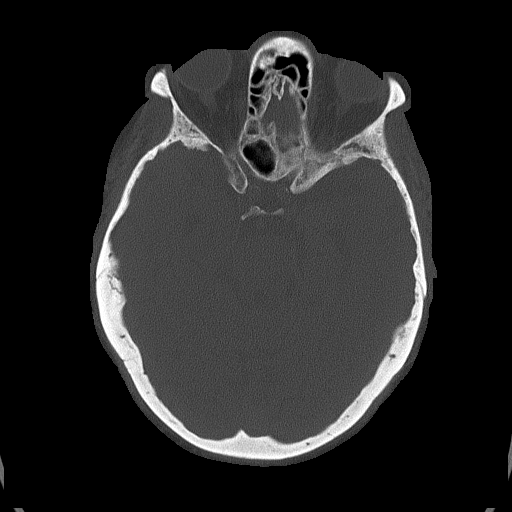
[im 53/79  bone]
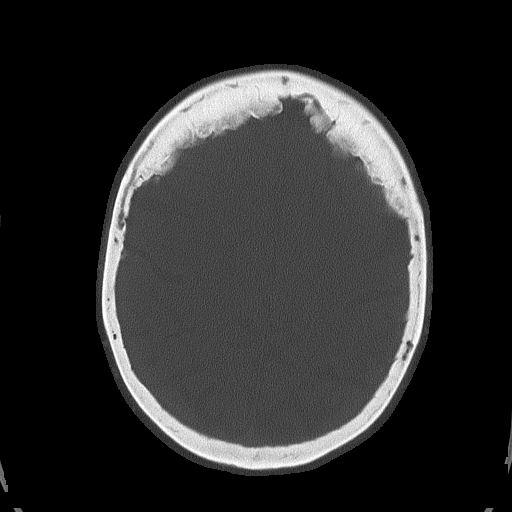

[Series 7: c_spine 2.0 st · axial · 0.26mm/px · z∈[-186,-138]mm · 2 of 73 slices shown, 3 images]
[im 25/73  soft-tissue]
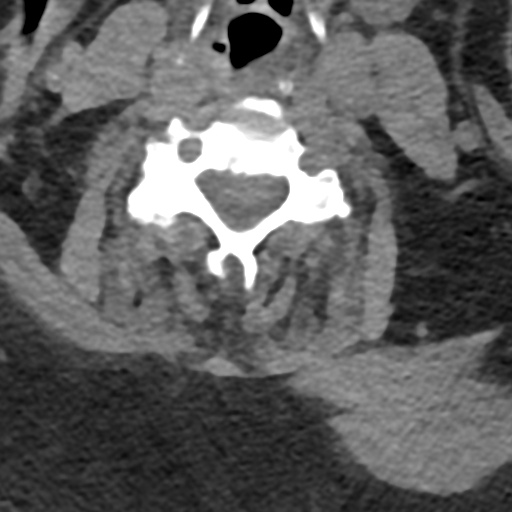
[im 25/73  bone]
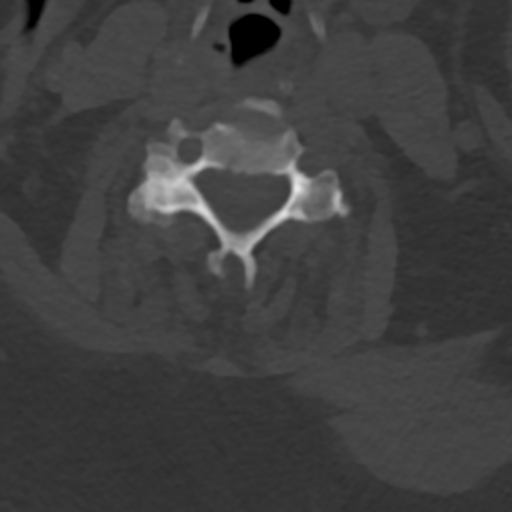
[im 49/73  bone]
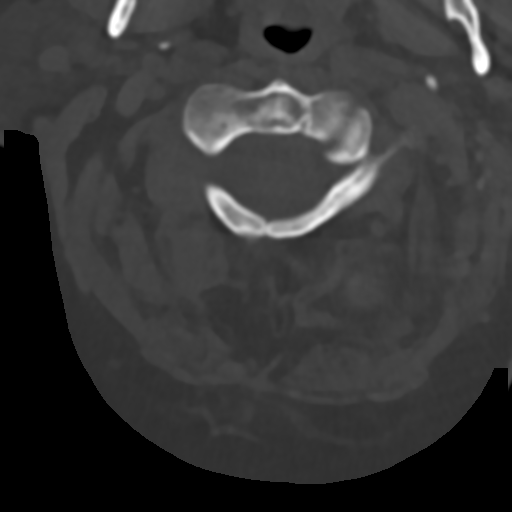

[Series 9: c_spine 2.0 sag bone · sagittal · 0.21mm/px · 4 of 61 slices shown]
[im 13/61  bone]
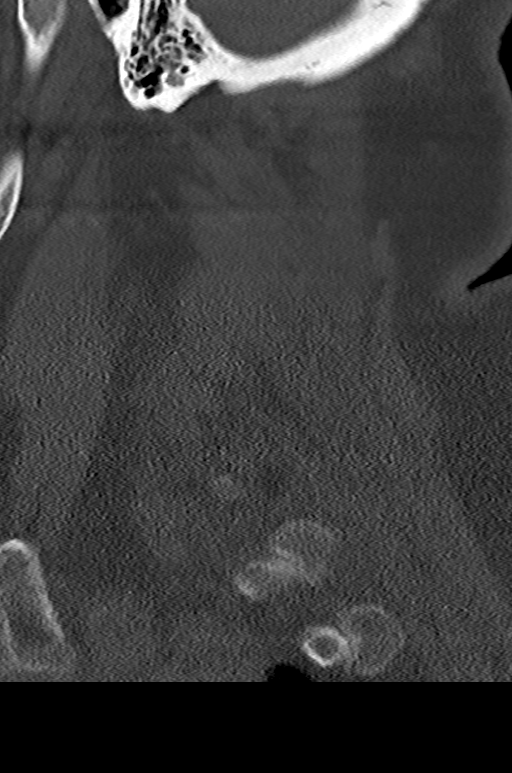
[im 25/61  bone]
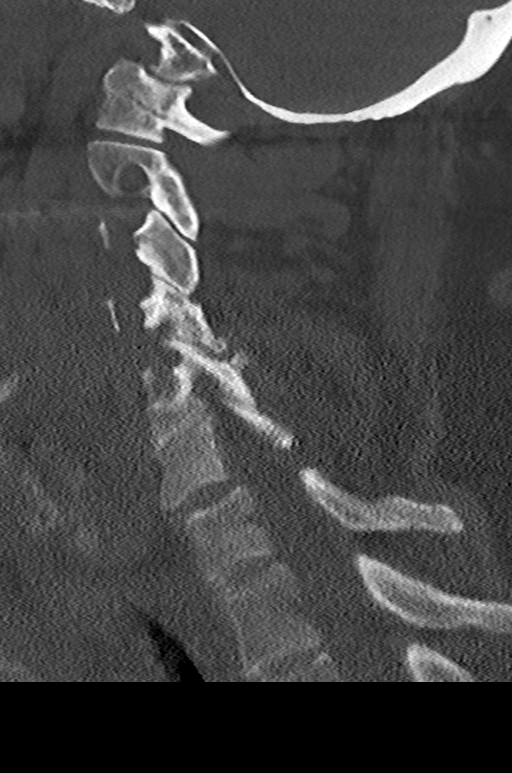
[im 37/61  bone]
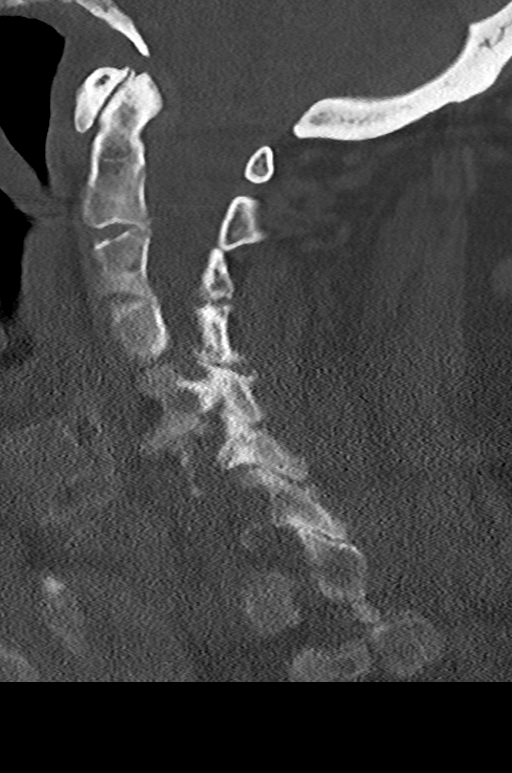
[im 49/61  bone]
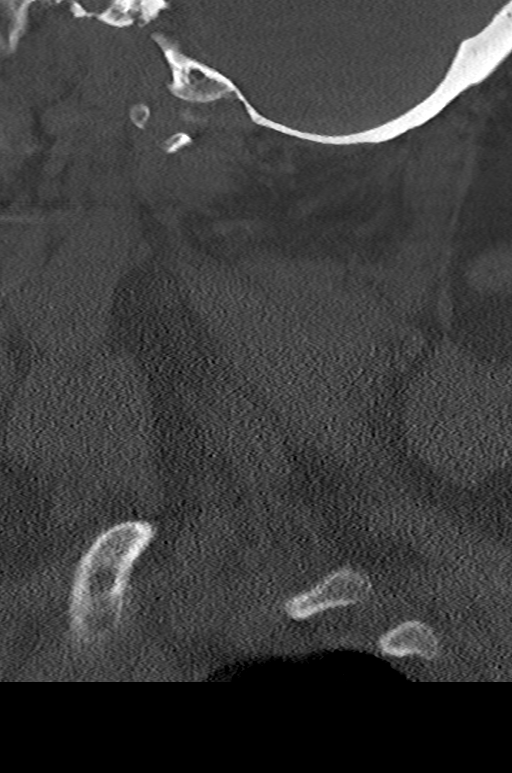

[Series 10: c_spine 2.0 cor bone · coronal · 0.23mm/px · 1 of 61 slices shown]
[im 31/61  bone]
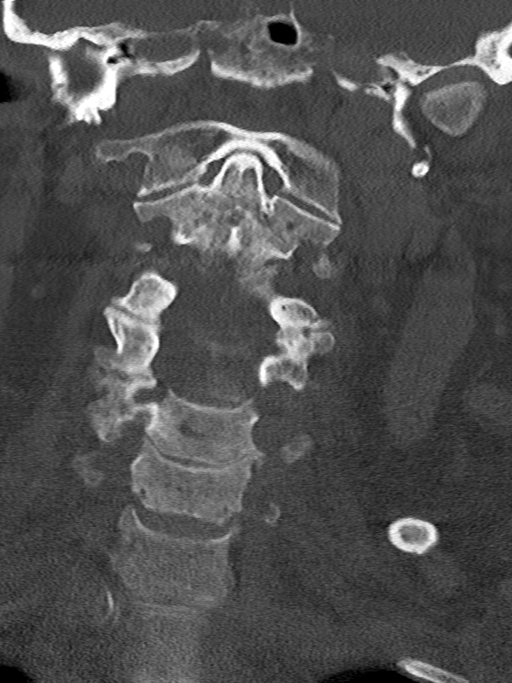

[Series 11: c_spine 2.0 orthogonals · axial · 0.21mm/px · z∈[-207,-137]mm · 2 of 78 slices shown]
[im 26/78  bone]
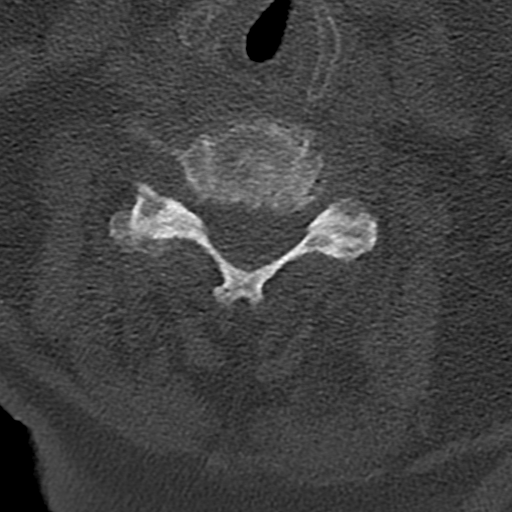
[im 52/78  bone]
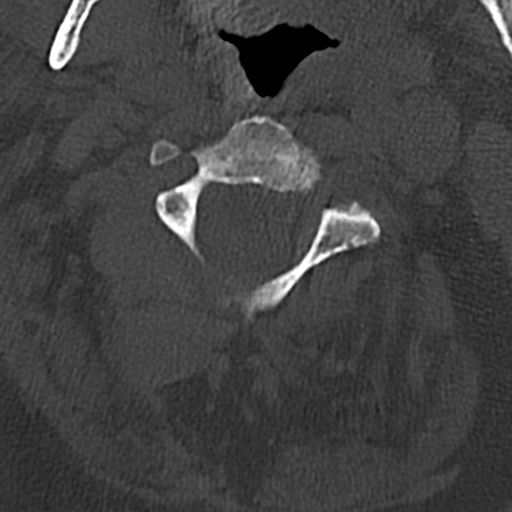

[11 of 33 positions shown; findings below may reference images not displayed]

FINDINGS: CT HEAD FINDINGS

Brain: No evidence of acute infarction, hemorrhage, hydrocephalus,
extra-axial collection or mass lesion/mass effect.

Vascular: No hyperdense vessel or unexpected calcification.

Skull: Normal. Negative for fracture or focal lesion.

Sinuses/Orbits: No acute finding.

Other: Scalp soft tissue swelling over the right lateral vertex.

CT CERVICAL SPINE FINDINGS

Alignment: Normal.

Skull base and vertebrae: No acute fracture. No primary bone lesion
or focal pathologic process.

Soft tissues and spinal canal: No prevertebral fluid or swelling. No
visible canal hematoma. Visualized airway is normally patent. No
incidental masses identified.

Disc levels: Moderate disc space narrowing and proliferative disease
at C5-6. Milder spondylosis at C6-7 with suggestion of minimal
anterolisthesis of C6 on C7.

Upper chest: Negative.
IMPRESSION: 1. Right lateral vertex scalp soft tissue swelling without evidence
of acute brain injury or skull fracture.
2. No evidence of acute cervical injury. Moderate cervical
spondylosis at C5-6 and milder spondylosis at C6-7.
